# Patient Record
Sex: Male | Born: 1980 | Race: White | Hispanic: Yes | Marital: Single | Smoking: Current some day smoker
Health system: Southern US, Community
[De-identification: ages and names within clinical notes are randomized; demographics above are authoritative.]

## PROBLEM LIST (undated history)

## (undated) DIAGNOSIS — F10931 Alcohol use, unspecified with withdrawal delirium: Secondary | ICD-10-CM

## (undated) DIAGNOSIS — F1010 Alcohol abuse, uncomplicated: Secondary | ICD-10-CM

## (undated) DIAGNOSIS — I10 Essential (primary) hypertension: Secondary | ICD-10-CM

## (undated) DIAGNOSIS — F10231 Alcohol dependence with withdrawal delirium: Secondary | ICD-10-CM

## (undated) DIAGNOSIS — K7460 Unspecified cirrhosis of liver: Secondary | ICD-10-CM

---

## 2003-09-05 ENCOUNTER — Emergency Department (HOSPITAL_COMMUNITY): Admission: EM | Admit: 2003-09-05 | Discharge: 2003-09-05 | Payer: Self-pay | Admitting: Emergency Medicine

## 2009-08-08 ENCOUNTER — Emergency Department (HOSPITAL_COMMUNITY): Admission: EM | Admit: 2009-08-08 | Discharge: 2009-08-08 | Payer: Self-pay | Admitting: Emergency Medicine

## 2019-11-10 ENCOUNTER — Other Ambulatory Visit: Payer: Self-pay

## 2019-11-10 ENCOUNTER — Encounter (HOSPITAL_COMMUNITY): Payer: Self-pay | Admitting: Emergency Medicine

## 2019-11-10 ENCOUNTER — Emergency Department (HOSPITAL_COMMUNITY)
Admission: EM | Admit: 2019-11-10 | Discharge: 2019-11-11 | Disposition: A | Discharge status: Home / Self Care | Payer: Self-pay | Attending: Emergency Medicine | Admitting: Emergency Medicine

## 2019-11-10 DIAGNOSIS — R451 Restlessness and agitation: Secondary | ICD-10-CM | POA: Insufficient documentation

## 2019-11-10 DIAGNOSIS — Z20822 Contact with and (suspected) exposure to covid-19: Secondary | ICD-10-CM | POA: Insufficient documentation

## 2019-11-10 DIAGNOSIS — R443 Hallucinations, unspecified: Secondary | ICD-10-CM | POA: Insufficient documentation

## 2019-11-10 DIAGNOSIS — E871 Hypo-osmolality and hyponatremia: Secondary | ICD-10-CM | POA: Insufficient documentation

## 2019-11-10 NOTE — ED Triage Notes (Signed)
Wife brought patient into ED to be evaluated for hallucinations.  Patient has done this before after using a drug.  Unknown if he has done this again.  Patient denies any SI or HI at this time.

## 2019-11-11 ENCOUNTER — Emergency Department (HOSPITAL_COMMUNITY): Payer: Self-pay

## 2019-11-11 LAB — COMPREHENSIVE METABOLIC PANEL
ALT: 78 U/L — ABNORMAL HIGH (ref 0–44)
AST: 101 U/L — ABNORMAL HIGH (ref 15–41)
Albumin: 4.5 g/dL (ref 3.5–5.0)
Alkaline Phosphatase: 94 U/L (ref 38–126)
Anion gap: 13 (ref 5–15)
BUN: 11 mg/dL (ref 6–20)
CO2: 22 mmol/L (ref 22–32)
Calcium: 9.6 mg/dL (ref 8.9–10.3)
Chloride: 95 mmol/L — ABNORMAL LOW (ref 98–111)
Creatinine, Ser: 0.64 mg/dL (ref 0.61–1.24)
GFR calc Af Amer: >60 mL/min (ref >60)
GFR calc non Af Amer: >60 mL/min (ref >60)
Glucose, Bld: 150 mg/dL — ABNORMAL HIGH (ref 70–99)
Potassium: 3 mmol/L — ABNORMAL LOW (ref 3.5–5.1)
Sodium: 130 mmol/L — ABNORMAL LOW (ref 135–145)
Total Bilirubin: 1.9 mg/dL — ABNORMAL HIGH (ref 0.3–1.2)
Total Protein: 8.6 g/dL — ABNORMAL HIGH (ref 6.5–8.1)

## 2019-11-11 LAB — CBC
HCT: 43.2 % (ref 39.0–52.0)
Hemoglobin: 14.4 g/dL (ref 13.0–17.0)
MCH: 30.9 pg (ref 26.0–34.0)
MCHC: 33.3 g/dL (ref 30.0–36.0)
MCV: 92.7 fL (ref 80.0–100.0)
Platelets: 91 10*3/uL — ABNORMAL LOW (ref 150–400)
RBC: 4.66 MIL/uL (ref 4.22–5.81)
RDW: 15.4 % (ref 11.5–15.5)
WBC: 8.3 10*3/uL (ref 4.0–10.5)
nRBC: 0 % (ref 0.0–0.2)

## 2019-11-11 LAB — SARS CORONAVIRUS 2 BY RT PCR (HOSPITAL ORDER, PERFORMED IN ~~LOC~~ HOSPITAL LAB): SARS Coronavirus 2: NEGATIVE

## 2019-11-11 LAB — RAPID URINE DRUG SCREEN, HOSP PERFORMED
Amphetamines: POSITIVE — AB
Barbiturates: NOT DETECTED
Benzodiazepines: NOT DETECTED
Cocaine: NOT DETECTED
Opiates: NOT DETECTED
Tetrahydrocannabinol: NOT DETECTED

## 2019-11-11 LAB — ETHANOL: Alcohol, Ethyl (B): 74 mg/dL — ABNORMAL HIGH (ref <10)

## 2019-11-11 LAB — SALICYLATE LEVEL: Salicylate Lvl: <7 mg/dL — ABNORMAL LOW (ref 7.0–30.0)

## 2019-11-11 LAB — ACETAMINOPHEN LEVEL: Acetaminophen (Tylenol), Serum: <10 ug/mL — ABNORMAL LOW (ref 10–30)

## 2019-11-11 MED ORDER — THIAMINE HCL 100 MG PO TABS
100.0000 mg | ORAL_TABLET | Freq: Every day | ORAL | Status: DC
  Administered 2019-11-11: 100 mg via ORAL
  Filled 2019-11-11: qty 1

## 2019-11-11 MED ORDER — LORAZEPAM 1 MG PO TABS
1.0000 mg | ORAL_TABLET | Freq: Once | ORAL | Status: AC
  Administered 2019-11-11: 1 mg via ORAL
  Filled 2019-11-11: qty 1

## 2019-11-11 MED ORDER — HYDROXYZINE HCL 25 MG PO TABS
25.0000 mg | ORAL_TABLET | Freq: Three times a day (TID) | ORAL | 0 refills | Status: DC | PRN
Start: 2019-11-11 — End: 2020-10-04

## 2019-11-11 MED ORDER — THIAMINE HCL 100 MG/ML IJ SOLN: 100.0000 mg | Freq: Every day | INTRAMUSCULAR | Status: DC

## 2019-11-11 MED ORDER — POTASSIUM CHLORIDE CRYS ER 20 MEQ PO TBCR
40.0000 meq | EXTENDED_RELEASE_TABLET | Freq: Once | ORAL | Status: AC
  Administered 2019-11-11: 40 meq via ORAL
  Filled 2019-11-11: qty 2

## 2019-11-11 MED ORDER — LORAZEPAM 2 MG/ML IJ SOLN: 0.0000 mg | Freq: Two times a day (BID) | INTRAMUSCULAR | Status: DC

## 2019-11-11 MED ORDER — LORAZEPAM 2 MG/ML IJ SOLN: 0.0000 mg | Freq: Four times a day (QID) | INTRAMUSCULAR | Status: DC

## 2019-11-11 MED ORDER — LORAZEPAM 1 MG PO TABS: 0.0000 mg | ORAL_TABLET | Freq: Two times a day (BID) | ORAL | Status: DC

## 2019-11-11 MED ORDER — LORAZEPAM 1 MG PO TABS: 0.0000 mg | ORAL_TABLET | Freq: Four times a day (QID) | ORAL | Status: DC

## 2019-11-11 NOTE — Discharge Instructions (Addendum)
I suspect her hallucinations are likely due to drinking alcohol and drug use.  Take Vistaril as directed.   Your work-up today was reassuring.  You did have some slight elevations in your liver enzymes.  This could be due to alcohol use.  This needs to be rechecked in a few weeks.  Tylenol.  I have provided you some resources to help if you feel like you need help with drugs.    Return to the emergency department for any hallucinations, thoughts of wanting to hurt or kill yourself or any other worsening or concerning symptoms.

## 2019-11-11 NOTE — BH Assessment (Addendum)
Tele Assessment Note   Patient Name: [name] MRN: [id number] Referring Physician: Elson Clan Location of Patient: [facility] ED Location of Provider: Behavioral Health TTS Department  Draven Natter Ardyth Harps is an 39 y.o. male presenting voluntarily to [facility] ED for assessment. Patient reports 3 days ago he began to see people staring at him and hearing them talk. He states his family did not see and hear these things. He states he became paranoid someone was after him. Patient reports AVH earlier this date but none currently. He denies SI/HI. Patient reports he used a small amount of cocaine on Sunday with his friends. UDS negative for cocaine but positive for amphetamines. Patient states his friends use "all kinds of drugs." Counselor discussed effects and dangers of methamphetamines. He states he does not currently have any services and states he does not feel he is in need of assistance with his substance use and mental health at this time.   Patient is alert and oriented x 4. He is dressed appropriately. His speech is logical, eye contact is poor, and his thoughts are organized. His mood is depressed and his affect is congruent. He has limited insight, judgement, and impulse control. He does not appear to be responding to internal stimuli or experiencing delusional thought content.   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 No pertinent past medical history.  History reviewed. No pertinent surgical history.  Family History: No family history on file.  Social History:  has no history on file for tobacco use, alcohol use, and drug use.  Additional Social History:  Alcohol / Drug Use Pain Medications: see MAR Prescriptions: see MAR Over the Counter: see MAR History of alcohol / drug use?: Yes Substance #1 Name of Substance 1: cocaine 1 - Age of First Use: UTA 1 - Amount (size/oz): UTA 1 - Frequency:  occsionally 1 - Duration: UTA 1 - Last Use / Amount: UTA  CIWA: CIWA-Ar BP: (!) 168/96 Pulse Rate: 82 Nausea and Vomiting: no nausea and no vomiting Tactile Disturbances: none Tremor: moderate, with patient's arms extended Auditory Disturbances: very mild harshness or ability to frighten Paroxysmal Sweats: no sweat visible Visual Disturbances: not present Anxiety: two Headache, Fullness in Head: none present Agitation: normal activity Orientation and Clouding of Sensorium: oriented and can do serial additions CIWA-Ar Total: 7 COWS:    Allergies:  Allergies  Allergen Reactions  . Penicillins     Home Medications: (Not in a hospital admission)   OB/GYN Status:  No LMP for male patient.  General Assessment Data Location of Assessment: [facility] ED TTS Assessment: In system Is this a Tele or Face-to-Face Assessment?: Tele Assessment Is this an Initial Assessment or a Re-assessment for this encounter?: Initial Assessment Patient Accompanied by:: N/A Language Other than English: No Living Arrangements:  (with family) What gender do you identify as?: Male Date Telepsych consult ordered in [facility]: 11/11/19 Time Telepsych consult ordered in [facility]: 1512 Marital status: Married Pregnancy Status: No Living Arrangements: Spouse/significant other, Children Can pt return to current living arrangement?: Yes Admission Status: Voluntary Is patient capable of signing voluntary admission?: Yes Referral Source: Self/Family/Friend Insurance type: none     Crisis Care Plan Living Arrangements: Spouse/significant other, Children Legal Guardian:  (self) Name of Psychiatrist: denies Name of Therapist: denies  Education Status Is patient currently in school?: No Is the patient employed, unemployed or receiving disability?: Employed  Risk to self with the past 6 months Suicidal Ideation: No Has  patient been a risk to self within the past 6 months prior to admission? : No Suicidal Intent:  No Has patient had any suicidal intent within the past 6 months prior to admission? : No Is patient at risk for suicide?: No Suicidal Plan?: No Has patient had any suicidal plan within the past 6 months prior to admission? : No Access to Means: No What has been your use of drugs/alcohol within the last 12 months?: reports using cocaine and alcohol Previous Attempts/Gestures: No How many times?: 0 Other Self Harm Risks: denies Triggers for Past Attempts: None known Intentional Self Injurious Behavior: None Family Suicide History: No Recent stressful life event(s):  (none noted) Persecutory voices/beliefs?: No Depression: No Substance abuse history and/or treatment for substance abuse?: No Suicide prevention information given to non-admitted patients: Not applicable  Risk to Others within the past 6 months Homicidal Ideation: No Does patient have any lifetime risk of violence toward others beyond the six months prior to admission? : No Thoughts of Harm to Others: No Current Homicidal Intent: No Current Homicidal Plan: No Access to Homicidal Means: No Identified Victim: denies History of harm to others?: No Assessment of Violence: None Noted Violent Behavior Description: denies Does patient have access to weapons?: No Criminal Charges Pending?: No Does patient have a court date: No Is patient on probation?: No  Psychosis Hallucinations: Auditory, Visual Delusions: Unspecified  Mental Status Report Appearance/Hygiene: Unremarkable Eye Contact: Poor Motor Activity: Freedom of movement Speech: Logical/coherent Level of Consciousness: Alert Mood: Anxious Affect: Anxious Anxiety Level: Moderate Thought Processes: Coherent, Relevant Judgement: Partial Orientation: Person, Place, Time, Situation Obsessive Compulsive Thoughts/Behaviors: None  Cognitive Functioning Concentration: Normal Memory: Recent Intact, Remote Intact Is patient IDD: No Insight: Fair Impulse  Control: Fair Appetite: Good Have you had any weight changes? : No Change Sleep: No Change Total Hours of Sleep: 7 Vegetative Symptoms: None  ADLScreening [facility] Assessment Services) Patient's cognitive ability adequate to safely complete daily activities?: Yes Patient able to express need for assistance with ADLs?: Yes Independently performs ADLs?: Yes (appropriate for developmental age)  Prior Inpatient Therapy Prior Inpatient Therapy: No  Prior Outpatient Therapy Prior Outpatient Therapy: No Does patient have an ACCT team?: No Does patient have Intensive In-House Services?  : No Does patient have [facility] services? : No Does patient have P4CC services?: No  ADL Screening (condition at time of admission) Patient's cognitive ability adequate to safely complete daily activities?: Yes Is the patient deaf or have difficulty hearing?: No Does the patient have difficulty seeing, even when wearing glasses/contacts?: No Does the patient have difficulty concentrating, remembering, or making decisions?: No Patient able to express need for assistance with ADLs?: Yes Does the patient have difficulty dressing or bathing?: No Independently performs ADLs?: Yes (appropriate for developmental age) Does the patient have difficulty walking or climbing stairs?: No Weakness of Legs: None Weakness of Arms/Hands: None  Home Assistive Devices/Equipment Home Assistive Devices/Equipment: None  Therapy Consults (therapy consults require a physician order) PT Evaluation Needed: No OT Evalulation Needed: No SLP Evaluation Needed: No Abuse/Neglect Assessment (Assessment to be complete while patient is alone) Abuse/Neglect Assessment Can Be Completed: Yes Physical Abuse: Denies Verbal Abuse: Denies Sexual Abuse: Denies Exploitation of patient/patient's resources: Denies Self-Neglect: Denies Values / Beliefs Cultural Requests During Hospitalization: None Spiritual Requests During Hospitalization:  None Consults Spiritual Care Consult Needed: No Transition of Care Team Consult Needed: No Advance Directives (For Healthcare) Does Patient Have a Medical Advance Directive?: No Would patient like information on creating  a medical advance directive?: No - Patient declined          Disposition: Per Berneice Heinrich, FNP this patient does not meet in patient criteria and is psych cleared.TTS will fax over substance use resources. Disposition Initial Assessment Completed for this Encounter: Yes  This service was provided via telemedicine using a 2-way, interactive audio and video technology.  Names of all persons participating in this telemedicine service and their role in this encounter. Name: Kahleb Mcclane Role: patient  Name: Celedonio Miyamoto, LCSW Role: TTS  Name:  Role:   Name:  Role:     Celedonio Miyamoto 11/11/2019 5:52 PM

## 2019-11-11 NOTE — BHH Counselor (Addendum)
Disposition: Per Berneice Heinrich, FNP this patient does not meet in patient criteria and is psych cleared.TTS will fax over substance use resources.

## 2019-11-11 NOTE — ED Provider Notes (Signed)
[facility] EMERGENCY DEPARTMENT Provider Note   CSN: [id number] Arrival date & time: 11/10/19  2044     History Chief Complaint  Patient presents with  . Hallucinations    [name] is a 39 y.o. male brought in by wife for evaluation of hallucinations.  Per wife, patient had been having hallucinations 3 days ago.  He was at home on Sunday and wife states that he was concerned that people were trying to get him.  She reports that he was running out to the street running away from people.  She reports that he knocked on a bunch of neighbors doors.  He does endorse using cocaine and drinking beer on Sunday prior to this event.  Patient states he currently does not see anything go away feels like he is still been having hallucinations.  He reports that still while in the hospital patient has been saying "look" and there will be nobody there.  Patient reports that sometimes he does see things.  He reports that on Sunday, he got nervous because he thought people were following him and he saw people.  He states that since using drugs on Sunday, this has not happened to him again.  He does not daily drinker.  He reports that his last drink was Sunday.  He states that he had did not use any other drugs.  He denies any SI, HI.  He denies any complaints at this time.  The history is provided by the patient.       History reviewed. No pertinent past medical history.  There are no problems to display for this patient.   History reviewed. No pertinent surgical history.     No family history on file.  Social History   Tobacco Use  . Smoking status: Not on file  Substance Use Topics  . Alcohol use: Not on file  . Drug use: Not on file    Home Medications Prior to Admission medications   Medication Sig Start Date End Date Taking? Authorizing Provider  hydrOXYzine (ATARAX/VISTARIL) 25 MG tablet Take 1 tablet (25 mg total) by mouth every 8 (eight) hours as  needed. 11/11/19   Maxwell Caul, PA-C    Allergies    Penicillins  Review of Systems   Review of Systems  Constitutional: Negative for fever.  Respiratory: Negative for cough and shortness of breath.   Cardiovascular: Negative for chest pain.  Gastrointestinal: Negative for abdominal pain, nausea and vomiting.  Neurological: Negative for weakness, numbness and headaches.  Psychiatric/Behavioral: Positive for hallucinations. Negative for suicidal ideas.  All other systems reviewed and are negative.   Physical Exam Updated Vital Signs BP (!) 151/100   Pulse 94   Temp 98.4 F (36.9 C) (Oral)   Resp 16   SpO2 100%   Physical Exam Vitals and nursing note reviewed.  Constitutional:      Appearance: Normal appearance. He is well-developed.  HENT:     Head: Normocephalic and atraumatic.  Eyes:     General: Lids are normal.     Conjunctiva/sclera: Conjunctivae normal.     Pupils: Pupils are equal, round, and reactive to light.  Cardiovascular:     Rate and Rhythm: Normal rate and regular rhythm.     Pulses: Normal pulses.     Heart sounds: Normal heart sounds. No murmur heard.  No friction rub. No gallop.   Pulmonary:     Effort: Pulmonary effort is normal.     Breath sounds:  Normal breath sounds.     Comments: Lungs clear to auscultation bilaterally.  Symmetric chest rise.  No wheezing, rales, rhonchi. Abdominal:     Palpations: Abdomen is soft. Abdomen is not rigid.     Tenderness: There is no abdominal tenderness. There is no guarding.  Musculoskeletal:        General: Normal range of motion.     Cervical back: Full passive range of motion without pain.  Skin:    General: Skin is warm and dry.     Capillary Refill: Capillary refill takes less than 2 seconds.  Neurological:     Mental Status: He is alert and oriented to person, place, and time.     Comments: Cranial nerves III-XII intact Follows commands, Moves all extremities  5/5 strength to BUE and BLE    Sensation intact throughout all major nerve distributions Normal gait No slurred speech. No facial droop.   Psychiatric:        Speech: Speech normal.     ED Results / Procedures / Treatments   Labs (all labs ordered are listed, but only abnormal results are displayed) Labs Reviewed  COMPREHENSIVE METABOLIC PANEL - Abnormal; Notable for the following components:      Result Value   Sodium 130 (*)    Potassium 3.0 (*)    Chloride 95 (*)    Glucose, Bld 150 (*)    Total Protein 8.6 (*)    AST 101 (*)    ALT 78 (*)    Total Bilirubin 1.9 (*)    All other components within normal limits  ETHANOL - Abnormal; Notable for the following components:   Alcohol, Ethyl (B) 74 (*)    All other components within normal limits  SALICYLATE LEVEL - Abnormal; Notable for the following components:   Salicylate Lvl <7.0 (*)    All other components within normal limits  ACETAMINOPHEN LEVEL - Abnormal; Notable for the following components:   Acetaminophen (Tylenol), Serum <10 (*)    All other components within normal limits  CBC - Abnormal; Notable for the following components:   Platelets 91 (*)    All other components within normal limits  RAPID URINE DRUG SCREEN, HOSP PERFORMED - Abnormal; Notable for the following components:   Amphetamines POSITIVE (*)    All other components within normal limits  SARS CORONAVIRUS 2 BY RT PCR (HOSPITAL ORDER, PERFORMED IN [facility])    EKG None  Radiology CT Head Wo Contrast  Result Date: 11/11/2019 CLINICAL DATA:  Psychosis. Additional history provided: Hallucinations. EXAM: CT HEAD WITHOUT CONTRAST TECHNIQUE: Contiguous axial images were obtained from the base of the skull through the vertex without intravenous contrast. COMPARISON:  No pertinent prior exams are available for comparison. FINDINGS: Brain: Cerebral volume is normal. There is no acute intracranial hemorrhage. No demarcated cortical infarct. No extra-axial fluid  collection. No evidence of intracranial mass. No midline shift. Vascular: No hyperdense vessel. Skull: Normal. Negative for fracture or focal lesion. Sinuses/Orbits: Visualized orbits show no acute finding. Mild ethmoid sinus mucosal thickening. No significant mastoid effusion. Other: Anterior subluxation of the right mandibular condyle. IMPRESSION: Unremarkable non-contrast CT appearance of the brain. No evidence of acute intracranial abnormality. Anterior subluxation of the right mandibular condyle. Correlate with physical exam to exclude TMJ dislocation. Mild ethmoid sinus mucosal thickening. Electronically Signed   By: Jackey Loge DO   On: 11/11/2019 16:57    Procedures Procedures (including critical care time)  Medications Ordered in ED Medications  LORazepam (ATIVAN) injection 0-4 mg (0 mg Intravenous Not Given 11/11/19 1604)    Or  LORazepam (ATIVAN) tablet 0-4 mg ( Oral See Alternative 11/11/19 1604)  LORazepam (ATIVAN) injection 0-4 mg (has no administration in time range)    Or  LORazepam (ATIVAN) tablet 0-4 mg (has no administration in time range)  thiamine tablet 100 mg (100 mg Oral Given 11/11/19 1532)    Or  thiamine (B-1) injection 100 mg ( Intravenous See Alternative 11/11/19 1532)  LORazepam (ATIVAN) tablet 1 mg (1 mg Oral Given 11/11/19 1531)  potassium chloride SA (KLOR-CON) CR tablet 40 mEq (40 mEq Oral Given 11/11/19 1532)  LORazepam (ATIVAN) tablet 1 mg (1 mg Oral Given 11/11/19 1823)    ED Course  I have reviewed the triage vital signs and the nursing notes.  Pertinent labs & imaging results that were available during my care of the patient were reviewed by me and considered in my medical decision making (see chart for details).    MDM Rules/Calculators/A&P                           39 year old male presents for evaluation of hallucinations.  Wife reports that 2 days ago, patient was paranoid and stated that people were after him.  She reports that he ran into the  street and was knocking on people's doors.  Patient states that this was after he drank beer and used cocaine.  He states he has not had any more hallucinations but wife feels like he has had some episodes where he has been paranoid.  He states that his last drink was 2 days ago.  On initial ED arrival, he is afebrile, nontoxic-appearing.  Vital signs are stable.  He denies any SI, HI.  He denies any current hallucinations, visual or auditory.  We will plan to check labs and CT scan for evaluation of medical pathology.  I suspect this is most likely from drug use.  Wife is concerned because she states he has had some hallucinations outside of drug use.  Patient actively denying any hallucinations on my evaluation.  We will plan for TTS consultation.  At this time, patient does not appear to be in acute withdrawal.  He is slightly agitated but he is not tachycardic here in the ED.  He is not responding to internal stimuli.  Will give Ativan for anxious nervousness.  Covid negative.  Ethanol level 74.  CBC shows no leukocytosis or anemia.  Platelets are 91.  Acetaminophen level, salicylate level unremarkable.  CMP shows slight hyponatremia.  Potassium is 3.0.  Will give repletion.  BUN and creatinine within normal limits.  AST is 101, ALT of 78.  CT head negative for any acute abnormality.  I discussed work-up here in the ED with patient.  I did discuss with patient regarding LFT elevation.  I suspect this is most likely from alcohol.  Patient's wife is asking something for nervousness.  Will give short course of Vistaril.  Per [facility], patient is psych cleared.  He does not meet inpatient criteria.  At this time, patient denies any SI, HI.  Able to converse without any signs of confusion.  Denies any active hallucinations at this time.  He is hemodynamically stable.  Patient instructed to repeat his LFTs.  He was given a referral for outpatient PCP.  Additionally, patient given resources for substance abuse. At  this time, patient exhibits no emergent life-threatening condition that require further  evaluation in ED or admission. Patient had ample opportunity for questions and discussion. All patient's questions were answered with full understanding. Strict return precautions discussed. Patient expresses understanding and agreement to plan.  Portions of this note were generated with Scientist, clinical (histocompatibility and immunogenetics)Dragon dictation software. Dictation errors may occur despite best attempts at proofreading.  Final Clinical Impression(s) / ED Diagnoses Final diagnoses:  Hallucinations    Rx / DC Orders ED Discharge Orders         Ordered    hydrOXYzine (ATARAX/VISTARIL) 25 MG tablet  Every 8 hours PRN     Discontinue  Reprint     11/11/19 1833           Maxwell CaulLayden, Maneh Sieben A, PA-C 11/11/19 1839    Little, Ambrose Finlandachel Morgan, MD 11/14/19 [phone]

## 2019-11-11 NOTE — ED Notes (Signed)
Patient verbalizes understanding of discharge instructions. Opportunity for questioning and answers were provided. Armband removed by staff, pt discharged from ED ambulatory.   

## 2019-11-12 ENCOUNTER — Emergency Department (HOSPITAL_COMMUNITY): Payer: Self-pay

## 2019-11-12 ENCOUNTER — Encounter (HOSPITAL_COMMUNITY): Payer: Self-pay

## 2019-11-12 ENCOUNTER — Inpatient Hospital Stay (HOSPITAL_COMMUNITY)
Admission: EM | Admit: 2019-11-12 | Discharge: 2019-11-19 | DRG: 896 | Disposition: A | Discharge status: Home / Self Care | Payer: Self-pay | Attending: Internal Medicine | Admitting: Internal Medicine

## 2019-11-12 DIAGNOSIS — Y904 Blood alcohol level of 80-99 mg/100 ml: Secondary | ICD-10-CM | POA: Diagnosis present

## 2019-11-12 DIAGNOSIS — Z781 Physical restraint status: Secondary | ICD-10-CM

## 2019-11-12 DIAGNOSIS — F1910 Other psychoactive substance abuse, uncomplicated: Secondary | ICD-10-CM

## 2019-11-12 DIAGNOSIS — Z20822 Contact with and (suspected) exposure to covid-19: Secondary | ICD-10-CM | POA: Diagnosis present

## 2019-11-12 DIAGNOSIS — M6282 Rhabdomyolysis: Secondary | ICD-10-CM | POA: Diagnosis present

## 2019-11-12 DIAGNOSIS — E872 Acidosis: Secondary | ICD-10-CM | POA: Diagnosis present

## 2019-11-12 DIAGNOSIS — F329 Major depressive disorder, single episode, unspecified: Secondary | ICD-10-CM | POA: Diagnosis present

## 2019-11-12 DIAGNOSIS — F17200 Nicotine dependence, unspecified, uncomplicated: Secondary | ICD-10-CM | POA: Diagnosis present

## 2019-11-12 DIAGNOSIS — K7010 Alcoholic hepatitis without ascites: Secondary | ICD-10-CM | POA: Diagnosis present

## 2019-11-12 DIAGNOSIS — F10931 Alcohol use, unspecified with withdrawal delirium: Secondary | ICD-10-CM | POA: Diagnosis present

## 2019-11-12 DIAGNOSIS — G9341 Metabolic encephalopathy: Secondary | ICD-10-CM | POA: Diagnosis present

## 2019-11-12 DIAGNOSIS — F1010 Alcohol abuse, uncomplicated: Secondary | ICD-10-CM

## 2019-11-12 DIAGNOSIS — Z880 Allergy status to penicillin: Secondary | ICD-10-CM

## 2019-11-12 DIAGNOSIS — R748 Abnormal levels of other serum enzymes: Secondary | ICD-10-CM | POA: Diagnosis present

## 2019-11-12 DIAGNOSIS — F10229 Alcohol dependence with intoxication, unspecified: Secondary | ICD-10-CM | POA: Diagnosis present

## 2019-11-12 DIAGNOSIS — K7030 Alcoholic cirrhosis of liver without ascites: Secondary | ICD-10-CM | POA: Diagnosis present

## 2019-11-12 DIAGNOSIS — F10231 Alcohol dependence with withdrawal delirium: Principal | ICD-10-CM | POA: Diagnosis present

## 2019-11-12 DIAGNOSIS — D696 Thrombocytopenia, unspecified: Secondary | ICD-10-CM | POA: Diagnosis present

## 2019-11-12 DIAGNOSIS — F15959 Other stimulant use, unspecified with stimulant-induced psychotic disorder, unspecified: Secondary | ICD-10-CM | POA: Diagnosis present

## 2019-11-12 DIAGNOSIS — E876 Hypokalemia: Secondary | ICD-10-CM | POA: Diagnosis present

## 2019-11-12 DIAGNOSIS — R000 Tachycardia, unspecified: Secondary | ICD-10-CM | POA: Diagnosis present

## 2019-11-12 DIAGNOSIS — I10 Essential (primary) hypertension: Secondary | ICD-10-CM | POA: Diagnosis present

## 2019-11-12 DIAGNOSIS — F1490 Cocaine use, unspecified, uncomplicated: Secondary | ICD-10-CM | POA: Diagnosis present

## 2019-11-12 LAB — CBC WITH DIFFERENTIAL/PLATELET
Abs Immature Granulocytes: 0.01 10*3/uL (ref 0.00–0.07)
Basophils Absolute: 0.1 10*3/uL (ref 0.0–0.1)
Basophils Relative: 1 %
Eosinophils Absolute: 0.3 10*3/uL (ref 0.0–0.5)
Eosinophils Relative: 3 %
HCT: 42.6 % (ref 39.0–52.0)
Hemoglobin: 14.3 g/dL (ref 13.0–17.0)
Immature Granulocytes: 0 %
Lymphocytes Relative: 22 %
Lymphs Abs: 1.8 10*3/uL (ref 0.7–4.0)
MCH: 31 pg (ref 26.0–34.0)
MCHC: 33.6 g/dL (ref 30.0–36.0)
MCV: 92.4 fL (ref 80.0–100.0)
Monocytes Absolute: 1.3 10*3/uL — ABNORMAL HIGH (ref 0.1–1.0)
Monocytes Relative: 16 %
Neutro Abs: 4.8 10*3/uL (ref 1.7–7.7)
Neutrophils Relative %: 58 %
Platelets: 114 10*3/uL — ABNORMAL LOW (ref 150–400)
RBC: 4.61 MIL/uL (ref 4.22–5.81)
RDW: 15 % (ref 11.5–15.5)
WBC: 8.3 10*3/uL (ref 4.0–10.5)
nRBC: 0 % (ref 0.0–0.2)

## 2019-11-12 LAB — URINALYSIS, ROUTINE W REFLEX MICROSCOPIC
Bilirubin Urine: NEGATIVE
Glucose, UA: NEGATIVE mg/dL
Hgb urine dipstick: NEGATIVE
Ketones, ur: NEGATIVE mg/dL
Leukocytes,Ua: NEGATIVE
Nitrite: NEGATIVE
Protein, ur: NEGATIVE mg/dL
Specific Gravity, Urine: 1.002 — ABNORMAL LOW (ref 1.005–1.030)
pH: 6 (ref 5.0–8.0)

## 2019-11-12 LAB — RAPID URINE DRUG SCREEN, HOSP PERFORMED
Amphetamines: NOT DETECTED
Barbiturates: NOT DETECTED
Benzodiazepines: NOT DETECTED
Cocaine: NOT DETECTED
Opiates: NOT DETECTED
Tetrahydrocannabinol: NOT DETECTED

## 2019-11-12 LAB — COMPREHENSIVE METABOLIC PANEL
ALT: 120 U/L — ABNORMAL HIGH (ref 0–44)
AST: 152 U/L — ABNORMAL HIGH (ref 15–41)
Albumin: 4.6 g/dL (ref 3.5–5.0)
Alkaline Phosphatase: 90 U/L (ref 38–126)
Anion gap: 9 (ref 5–15)
BUN: 9 mg/dL (ref 6–20)
CO2: 26 mmol/L (ref 22–32)
Calcium: 9.1 mg/dL (ref 8.9–10.3)
Chloride: 98 mmol/L (ref 98–111)
Creatinine, Ser: 0.56 mg/dL — ABNORMAL LOW (ref 0.61–1.24)
GFR calc Af Amer: >60 mL/min (ref >60)
GFR calc non Af Amer: >60 mL/min (ref >60)
Glucose, Bld: 102 mg/dL — ABNORMAL HIGH (ref 70–99)
Potassium: 3.8 mmol/L (ref 3.5–5.1)
Sodium: 133 mmol/L — ABNORMAL LOW (ref 135–145)
Total Bilirubin: 2.3 mg/dL — ABNORMAL HIGH (ref 0.3–1.2)
Total Protein: 8.6 g/dL — ABNORMAL HIGH (ref 6.5–8.1)

## 2019-11-12 LAB — ETHANOL: Alcohol, Ethyl (B): 98 mg/dL — ABNORMAL HIGH (ref <10)

## 2019-11-12 LAB — SALICYLATE LEVEL: Salicylate Lvl: <7 mg/dL — ABNORMAL LOW (ref 7.0–30.0)

## 2019-11-12 LAB — SARS CORONAVIRUS 2 BY RT PCR (HOSPITAL ORDER, PERFORMED IN ~~LOC~~ HOSPITAL LAB): SARS Coronavirus 2: NEGATIVE

## 2019-11-12 LAB — ACETAMINOPHEN LEVEL: Acetaminophen (Tylenol), Serum: <10 ug/mL — ABNORMAL LOW (ref 10–30)

## 2019-11-12 MED ORDER — SODIUM CHLORIDE 0.9 % IV BOLUS
500.0000 mL | Freq: Once | INTRAVENOUS | Status: AC
  Administered 2019-11-12: 500 mL via INTRAVENOUS

## 2019-11-12 MED ORDER — IBUPROFEN 200 MG PO TABS
600.0000 mg | ORAL_TABLET | Freq: Three times a day (TID) | ORAL | Status: DC | PRN
  Administered 2019-11-17: 600 mg via ORAL
  Filled 2019-11-12: qty 3

## 2019-11-12 MED ORDER — HALOPERIDOL LACTATE 5 MG/ML IJ SOLN
5.0000 mg | Freq: Once | INTRAMUSCULAR | Status: AC
  Administered 2019-11-12: 5 mg via INTRAMUSCULAR

## 2019-11-12 MED ORDER — HALOPERIDOL LACTATE 5 MG/ML IJ SOLN
2.0000 mg | Freq: Once | INTRAMUSCULAR | Status: DC
  Filled 2019-11-12: qty 1

## 2019-11-12 MED ORDER — THIAMINE HCL 100 MG PO TABS
100.0000 mg | ORAL_TABLET | Freq: Every day | ORAL | Status: DC
  Administered 2019-11-12: 100 mg via ORAL
  Filled 2019-11-12: qty 1

## 2019-11-12 MED ORDER — LORAZEPAM 2 MG/ML IJ SOLN
0.0000 mg | Freq: Four times a day (QID) | INTRAMUSCULAR | Status: DC
  Administered 2019-11-13: 2 mg via INTRAVENOUS
  Filled 2019-11-12: qty 1

## 2019-11-12 MED ORDER — ZIPRASIDONE MESYLATE 20 MG IM SOLR
20.0000 mg | Freq: Once | INTRAMUSCULAR | Status: AC
Start: 2019-11-12 — End: 2019-11-12
  Administered 2019-11-12: 20 mg via INTRAMUSCULAR
  Filled 2019-11-12: qty 20

## 2019-11-12 MED ORDER — THIAMINE HCL 100 MG/ML IJ SOLN: 100.0000 mg | Freq: Every day | INTRAMUSCULAR | Status: DC

## 2019-11-12 MED ORDER — LORAZEPAM 2 MG/ML IJ SOLN: 0.0000 mg | Freq: Two times a day (BID) | INTRAMUSCULAR | Status: DC

## 2019-11-12 MED ORDER — LORAZEPAM 1 MG PO TABS: 0.0000 mg | ORAL_TABLET | Freq: Two times a day (BID) | ORAL | Status: DC

## 2019-11-12 MED ORDER — LORAZEPAM 2 MG/ML IJ SOLN: 2.0000 mg | Freq: Once | INTRAMUSCULAR | Status: DC

## 2019-11-12 MED ORDER — LORAZEPAM 1 MG PO TABS
0.0000 mg | ORAL_TABLET | Freq: Four times a day (QID) | ORAL | Status: DC
  Administered 2019-11-12: 2 mg via ORAL
  Filled 2019-11-12: qty 2

## 2019-11-12 MED ORDER — LORAZEPAM 2 MG/ML IJ SOLN
1.0000 mg | Freq: Once | INTRAMUSCULAR | Status: AC
  Administered 2019-11-12: 1 mg via INTRAVENOUS
  Filled 2019-11-12: qty 1

## 2019-11-12 MED ORDER — HYDROXYZINE HCL 25 MG PO TABS: 25.0000 mg | ORAL_TABLET | Freq: Three times a day (TID) | ORAL | Status: DC | PRN

## 2019-11-12 MED ORDER — STERILE WATER FOR INJECTION IJ SOLN
INTRAMUSCULAR | Status: AC
  Administered 2019-11-12: 10 mL
  Filled 2019-11-12: qty 10
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
## 2019-11-12 NOTE — ED Provider Notes (Signed)
Patient to ED under IVC by family for erratic, paranoid behavior, visual and auditory hallucinations, substance abuse. He was reportedly "knocking on neighbor's doors asking for their weapons". History of "amphetamine induced psychotic disorder".   He required chemical and physical restraint in the ED. VSS. Labs show liver dsyfunction, NEG drug screen here.   Plan: close observation, TTS consult when able.   12:15 - Discussed the patient with Dr. Fredderick Phenix who was present when patient arrived. Acute alcohol withdrawal with delirium was suspected based on his initial presentation. Plan for observation changed to inpatient disposition after conversation with the hospitalist.    Elpidio Anis, PA-C 11/13/19 [id number]    Rolan Bucco, MD 11/13/19 1650

## 2019-11-12 NOTE — ED Provider Notes (Signed)
[facility]-EMERGENCY DEPT Provider Note   CSN: [id number] Arrival date & time: 11/12/19  1116     History Chief Complaint  Patient presents with  . IVC  . Psychiatric Evaluation    Adeeb Konecny is a 39 y.o. male.  HPI HPI will be deferred due to level 5 caveat due to psychiatric disorder   Patient presents the emergency department IVC in police custody.  IVC paperwork was taken out by patient's daughter and wife.  IVC papers states he is a threat to himself and others has been seen walking around talking to people who are not there, running around the neighborhood ringing doorbell's and asking for their weapons.  It says patient took cocaine 5 days ago and drinks every day.  Patient was last seen here on on the 08/10 for similar complaints hallucination.  Lab work and imaging did not reveal any significant findings.  TTS consult was performed and states he did not meet criteria for inpatient admission and was given the diagnosis of amphetamine induced psychotic disorder.  He was alert and oriented and safe for discharge.  He presents back here today with continued hallucinations, he appears to be responding inappropriately to internal stimuli, actively trying to pull out his teeth, when asked questions states he needs to catch the sheep that are running around.  He denies suicidal or homicidal ideations.  States he is in no distress at this time.  He has no medical history, does not take any medications on a daily basis.  History reviewed. No pertinent past medical history.  There are no problems to display for this patient.   History reviewed. No pertinent surgical history.     History reviewed. No pertinent family history.  Social History   Tobacco Use  . Smoking status: Not on file  Substance Use Topics  . Alcohol use: Yes    Comment: drinks 6 beers daily  . Drug use: Yes    Types: Cocaine    Comment: states that he used cocaine on one  occasion last night    Home Medications Prior to Admission medications   Medication Sig Start Date End Date Taking? Authorizing Provider  hydrOXYzine (ATARAX/VISTARIL) 25 MG tablet Take 1 tablet (25 mg total) by mouth every 8 (eight) hours as needed. 11/11/19   Maxwell Caul, PA-C    Allergies    Penicillins  Review of Systems   Review of Systems  Unable to perform ROS: Psychiatric disorder    Physical Exam Updated Vital Signs BP (!) 151/103 (BP Location: Left Arm)   Pulse 97   Temp 98.8 F (37.1 C) (Oral)   Resp 18   Ht 5\' 6"  (1.676 m)   Wt 81.6 kg   SpO2 98%   BMI 29.05 kg/m   Physical Exam Vitals and nursing note reviewed.  Constitutional:      General: He is not in acute distress.    Appearance: He is not ill-appearing.  HENT:     Head: Normocephalic and atraumatic.     Nose: No congestion.     Mouth/Throat:     Mouth: Mucous membranes are moist.     Pharynx: Oropharynx is clear. No oropharyngeal exudate or posterior oropharyngeal erythema.  Eyes:     General: No scleral icterus.       Right eye: No discharge.        Left eye: No discharge.     Pupils: Pupils are equal, round, and reactive to light.  Cardiovascular:     Rate and Rhythm: Normal rate and regular rhythm.     Pulses: Normal pulses.     Heart sounds: No murmur heard.  No friction rub. No gallop.   Pulmonary:     Effort: No respiratory distress.     Breath sounds: No wheezing, rhonchi or rales.  Abdominal:     General: There is no distension.     Palpations: Abdomen is soft.     Tenderness: There is no abdominal tenderness. There is no right CVA tenderness, left CVA tenderness or guarding.  Musculoskeletal:        General: No swelling or tenderness.     Cervical back: No rigidity or tenderness.     Right lower leg: No edema.     Left lower leg: No edema.  Skin:    General: Skin is warm and dry.     Capillary Refill: Capillary refill takes less than 2 seconds.     Coloration: Skin is  not jaundiced or pale.     Findings: No bruising, lesion or rash.  Neurological:     General: No focal deficit present.     Mental Status: He is alert. He is disoriented.  Psychiatric:        Mood and Affect: Mood normal.     ED Results / Procedures / Treatments   Labs (all labs ordered are listed, but only abnormal results are displayed) Labs Reviewed  COMPREHENSIVE METABOLIC PANEL - Abnormal; Notable for the following components:      Result Value   Sodium 133 (*)    Glucose, Bld 102 (*)    Creatinine, Ser 0.56 (*)    Total Protein 8.6 (*)    AST 152 (*)    ALT 120 (*)    Total Bilirubin 2.3 (*)    All other components within normal limits  ETHANOL - Abnormal; Notable for the following components:   Alcohol, Ethyl (B) 98 (*)    All other components within normal limits  CBC WITH DIFFERENTIAL/PLATELET - Abnormal; Notable for the following components:   Platelets 114 (*)    Monocytes Absolute 1.3 (*)    All other components within normal limits  URINALYSIS, ROUTINE W REFLEX MICROSCOPIC - Abnormal; Notable for the following components:   Color, Urine STRAW (*)    Specific Gravity, Urine 1.002 (*)    All other components within normal limits  SALICYLATE LEVEL - Abnormal; Notable for the following components:   Salicylate Lvl <7.0 (*)    All other components within normal limits  ACETAMINOPHEN LEVEL - Abnormal; Notable for the following components:   Acetaminophen (Tylenol), Serum <10 (*)    All other components within normal limits  SARS CORONAVIRUS 2 BY RT PCR (HOSPITAL ORDER, PERFORMED IN [facility])  RAPID URINE DRUG SCREEN, HOSP PERFORMED    EKG None  Radiology CT Head Wo Contrast  Result Date: 11/11/2019 CLINICAL DATA:  Psychosis. Additional history provided: Hallucinations. EXAM: CT HEAD WITHOUT CONTRAST TECHNIQUE: Contiguous axial images were obtained from the base of the skull through the vertex without intravenous contrast. COMPARISON:  No  pertinent prior exams are available for comparison. FINDINGS: Brain: Cerebral volume is normal. There is no acute intracranial hemorrhage. No demarcated cortical infarct. No extra-axial fluid collection. No evidence of intracranial mass. No midline shift. Vascular: No hyperdense vessel. Skull: Normal. Negative for fracture or focal lesion. Sinuses/Orbits: Visualized orbits show no acute finding. Mild ethmoid sinus mucosal thickening. No significant mastoid effusion. Other:  Anterior subluxation of the right mandibular condyle. IMPRESSION: Unremarkable non-contrast CT appearance of the brain. No evidence of acute intracranial abnormality. Anterior subluxation of the right mandibular condyle. Correlate with physical exam to exclude TMJ dislocation. Mild ethmoid sinus mucosal thickening. Electronically Signed   By: Jackey LogeKyle  Golden DO   On: 11/11/2019 16:57   US Abdomen Limited  Result Date: 11/12/2019 CLINICAL DATA:  39 year old male with elevated LFTs. EXAM: ULTRASOUND ABDOMEN LIMITED RIGHT UPPER QUADRANT COMPARISON:  None. FINDINGS: Gallbladder: No gallstones or wall thickening visualized. No sonographic Murphy sign noted by sonographer. Common bile duct: Diameter: 3 mm Liver: There is morphologic changes of cirrhosis. Portal vein is patent on color Doppler imaging with normal direction of blood flow towards the liver. Other: None. IMPRESSION: 1. Cirrhosis. 2. 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 3. No gallstone. Electronically Signed   By: Elgie CollardArash  Radparvar M.D.   On: 11/12/2019 15:37    Procedures Procedures (including critical care time)  Medications Ordered in ED Medications  LORazepam (ATIVAN) injection 0-4 mg (has no administration in time range)    Or  LORazepam (ATIVAN) tablet 0-4 mg (has no administration in time range)  LORazepam (ATIVAN) injection 0-4 mg (has no administration in time range)    Or  LORazepam (ATIVAN) tablet 0-4 mg (has no administration in time range)  thiamine  tablet 100 mg (has no administration in time range)    Or  thiamine (B-1) injection 100 mg (has no administration in time range)  ibuprofen (ADVIL) tablet 600 mg (has no administration in time range)  hydrOXYzine (ATARAX/VISTARIL) tablet 25 mg (has no administration in time range)    ED Course  I have reviewed the triage vital signs and the nursing notes.  Pertinent labs & imaging results that were available during my care of the patient were reviewed by me and considered in my medical decision making (see chart for details).    MDM Rules/Calculators/A&P                          I have personally reviewed all imaging, labs and have interpreted them.  Patient did not appear to be in acute distress, did not appear to be disheveled, unable to provide appropriate responses, appears to be reacting inappropriately to internal stimuli.  Physical exam did not show any acute abnormalities.  Will perform med clearance labs and consult TTS for further evaluation.  IVC will remain in effect due to danger to self and others.  Initial labs are showing elevated liver enzymes AST 152 and ALT 120 with an elevated T bili of 2.3.  No right upper quadrant pain, no jaundice seen on exam possible secondary to alcohol consumption will order limited ultrasound of abdomen to rule out gallbladder abnormality.  Ultrasound showed cirrhosis, patient main portal vien with hepatopetal flow, no gallstones noted.   Low suspicion for systemic infection as patient is nontoxic-appearing, vital signs reassuring, CBC does not show leukocytosis no obvious source of infection seen on exam.  UA was negative for nitrates or leukocytes making Pil[place] or UTI unlikely.  Unlikely patient suffering from a metabolic abnormality as CMP no electrolyte abnormality, no signs of AKI.  Urine toxin was performed and was negative.  Acetaminophen was less than 10, salicylate less than 7, ethanol 98.  Unlikely patient suffering from cardiac  abnormality as he denies chest pain, no signs of hypoperfusion seen on exam.  TTS evaluate the patient and recommends inpatient care due to  alcoholic use disorder.  Will place in ED psych hold with CIWA.  Home meds were ordered.  Patient will remain IVC at this time. Final Clinical Impression(s) / ED Diagnoses Final diagnoses:  Alcohol abuse  Polysubstance abuse [facility])    Rx / DC Orders ED Discharge Orders    None       Barnie Del 11/12/19 1728    Mancel Bale, MD 11/12/19 1909

## 2019-11-12 NOTE — ED Provider Notes (Signed)
RN notified me that patient was diaphoretic and becoming more agitated.  He was found walking extremely diaphoretic.  Suspicious for alcoholic withdrawal, gave him 3 of Ativan and started IV fluids on him.  Nurse notified me again that he was trying to remove his IVs and was continually walking around becoming more combative.  Soft change or use to keep the patient from removing his IV and keep him in a stretcher.  She was still extremely agitated gave him 20 of Geodon.  Nurse notified me again that patient was continuing being combative trying to fight with his restraints gave him 5 of Haldol and gave him 2 mg of Ativan.  Will consult hospitalist to have patient admitted for alcohol withdrawals.  Spoke with Dr. Corinna Gab of the hospitalist team she will evaluate the patient for possible admission to hospital.  Due to shift change patient will be handed off to shari upstill PAC she was provided HPI, current work-up, and likely disposition.    Carroll Sage, PA-C 11/12/19 2316    Rolan Bucco, MD 11/13/19 1650
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
## 2019-11-12 NOTE — ED Notes (Signed)
Patient restless, moving around the hall attempting to move the bed. Patient redirectable. Saturated in sweat and visibly shaking. EDP made aware.

## 2019-11-13 DIAGNOSIS — F10231 Alcohol dependence with withdrawal delirium: Principal | ICD-10-CM

## 2019-11-13 DIAGNOSIS — F10931 Alcohol use, unspecified with withdrawal delirium: Secondary | ICD-10-CM | POA: Diagnosis present

## 2019-11-13 LAB — TROPONIN I (HIGH SENSITIVITY)
Troponin I (High Sensitivity): 8 ng/L (ref <18)
Troponin I (High Sensitivity): 10 ng/L (ref <18)

## 2019-11-13 LAB — CK
Total CK: 1365 U/L — ABNORMAL HIGH (ref 49–397)
Total CK: 976 U/L — ABNORMAL HIGH (ref 49–397)

## 2019-11-13 LAB — COMPREHENSIVE METABOLIC PANEL
ALT: 141 U/L — ABNORMAL HIGH (ref 0–44)
AST: 181 U/L — ABNORMAL HIGH (ref 15–41)
Albumin: 3.9 g/dL (ref 3.5–5.0)
Alkaline Phosphatase: 76 U/L (ref 38–126)
Anion gap: 9 (ref 5–15)
BUN: 13 mg/dL (ref 6–20)
CO2: 24 mmol/L (ref 22–32)
Calcium: 8.3 mg/dL — ABNORMAL LOW (ref 8.9–10.3)
Chloride: 105 mmol/L (ref 98–111)
Creatinine, Ser: 0.5 mg/dL — ABNORMAL LOW (ref 0.61–1.24)
GFR calc Af Amer: >60 mL/min (ref >60)
GFR calc non Af Amer: >60 mL/min (ref >60)
Glucose, Bld: 127 mg/dL — ABNORMAL HIGH (ref 70–99)
Potassium: 3.4 mmol/L — ABNORMAL LOW (ref 3.5–5.1)
Sodium: 138 mmol/L (ref 135–145)
Total Bilirubin: 1.7 mg/dL — ABNORMAL HIGH (ref 0.3–1.2)
Total Protein: 7.6 g/dL (ref 6.5–8.1)

## 2019-11-13 LAB — CBC
HCT: 39.7 % (ref 39.0–52.0)
Hemoglobin: 12.9 g/dL — ABNORMAL LOW (ref 13.0–17.0)
MCH: 31.2 pg (ref 26.0–34.0)
MCHC: 32.5 g/dL (ref 30.0–36.0)
MCV: 95.9 fL (ref 80.0–100.0)
Platelets: 85 10*3/uL — ABNORMAL LOW (ref 150–400)
RBC: 4.14 MIL/uL — ABNORMAL LOW (ref 4.22–5.81)
RDW: 15.1 % (ref 11.5–15.5)
WBC: 6.1 10*3/uL (ref 4.0–10.5)
nRBC: 0 % (ref 0.0–0.2)

## 2019-11-13 LAB — HEPATITIS PANEL, ACUTE
HCV Ab: NONREACTIVE
Hep A IgM: NONREACTIVE
Hep B C IgM: NONREACTIVE
Hepatitis B Surface Ag: NONREACTIVE

## 2019-11-13 LAB — AMMONIA
Ammonia: 57 umol/L — ABNORMAL HIGH (ref 9–35)
Ammonia: 71 umol/L — ABNORMAL HIGH (ref 9–35)

## 2019-11-13 LAB — HIV ANTIBODY (ROUTINE TESTING W REFLEX): HIV Screen 4th Generation wRfx: NONREACTIVE

## 2019-11-13 LAB — PHOSPHORUS: Phosphorus: 4.4 mg/dL (ref 2.5–4.6)

## 2019-11-13 LAB — MAGNESIUM: Magnesium: 2.3 mg/dL (ref 1.7–2.4)

## 2019-11-13 LAB — TSH: TSH: 1.943 u[IU]/mL (ref 0.350–4.500)

## 2019-11-13 MED ORDER — FOLIC ACID 1 MG PO TABS: 1.0000 mg | ORAL_TABLET | Freq: Every day | ORAL | Status: DC

## 2019-11-13 MED ORDER — LACTULOSE ENEMA
300.0000 mL | Freq: Once | ORAL | Status: DC
  Filled 2019-11-13: qty 300

## 2019-11-13 MED ORDER — ONDANSETRON HCL 4 MG/2ML IJ SOLN: 4.0000 mg | Freq: Four times a day (QID) | INTRAMUSCULAR | Status: DC | PRN

## 2019-11-13 MED ORDER — HEPARIN SODIUM (PORCINE) 5000 UNIT/ML IJ SOLN
5000.0000 [IU] | Freq: Three times a day (TID) | INTRAMUSCULAR | Status: DC
  Administered 2019-11-13: 5000 [IU] via SUBCUTANEOUS
  Filled 2019-11-13: qty 1

## 2019-11-13 MED ORDER — MAGNESIUM SULFATE 2 GM/50ML IV SOLN
2.0000 g | Freq: Once | INTRAVENOUS | Status: AC
  Administered 2019-11-13: 2 g via INTRAVENOUS
  Filled 2019-11-13: qty 50

## 2019-11-13 MED ORDER — THIAMINE HCL 100 MG PO TABS
100.0000 mg | ORAL_TABLET | Freq: Every day | ORAL | Status: DC
  Administered 2019-11-15 – 2019-11-19 (×5): 100 mg via ORAL
  Filled 2019-11-13 (×5): qty 1

## 2019-11-13 MED ORDER — THIAMINE HCL 100 MG/ML IJ SOLN
100.0000 mg | Freq: Every day | INTRAMUSCULAR | Status: DC
  Administered 2019-11-13 – 2019-11-14 (×2): 100 mg via INTRAVENOUS
  Filled 2019-11-13 (×2): qty 2

## 2019-11-13 MED ORDER — LORAZEPAM 2 MG/ML IJ SOLN
1.0000 mg | Freq: Three times a day (TID) | INTRAMUSCULAR | Status: AC
  Administered 2019-11-15 (×2): 1 mg via INTRAVENOUS
  Filled 2019-11-13 (×2): qty 1

## 2019-11-13 MED ORDER — SODIUM CHLORIDE 0.9 % IV SOLN: 1.0000 mg | Freq: Every day | INTRAVENOUS | Status: DC

## 2019-11-13 MED ORDER — POTASSIUM CHLORIDE 10 MEQ/100ML IV SOLN
10.0000 meq | INTRAVENOUS | Status: AC
  Administered 2019-11-13 (×2): 10 meq via INTRAVENOUS
  Filled 2019-11-13 (×2): qty 100

## 2019-11-13 MED ORDER — FOLIC ACID 5 MG/ML IJ SOLN
1.0000 mg | Freq: Every day | INTRAMUSCULAR | Status: DC
  Administered 2019-11-13 – 2019-11-16 (×4): 1 mg via INTRAVENOUS
  Filled 2019-11-13 (×4): qty 0.2

## 2019-11-13 MED ORDER — LORAZEPAM 2 MG/ML IJ SOLN
1.0000 mg | Freq: Four times a day (QID) | INTRAMUSCULAR | Status: AC
  Administered 2019-11-14 – 2019-11-15 (×2): 1 mg via INTRAVENOUS
  Filled 2019-11-13 (×2): qty 1

## 2019-11-13 MED ORDER — DEXTROSE IN LACTATED RINGERS 5 % IV SOLN
INTRAVENOUS | Status: DC
  Filled 2019-11-13 (×2): qty 1000

## 2019-11-13 MED ORDER — LORAZEPAM 1 MG PO TABS: 1.0000 mg | ORAL_TABLET | ORAL | Status: DC | PRN

## 2019-11-13 MED ORDER — LORAZEPAM 2 MG/ML IJ SOLN
1.0000 mg | INTRAMUSCULAR | Status: DC | PRN
  Administered 2019-11-16 (×2): 1 mg via INTRAVENOUS
  Filled 2019-11-13 (×2): qty 1

## 2019-11-13 MED ORDER — POLYETHYLENE GLYCOL 3350 17 G PO PACK: 17.0000 g | PACK | Freq: Every day | ORAL | Status: DC | PRN

## 2019-11-13 MED ORDER — LORAZEPAM 2 MG/ML IJ SOLN
1.0000 mg | INTRAMUSCULAR | Status: AC
  Administered 2019-11-13 (×2): 1 mg via INTRAVENOUS
  Filled 2019-11-13 (×3): qty 1

## 2019-11-13 MED ORDER — LORAZEPAM 2 MG/ML IJ SOLN: 0.0000 mg | Freq: Three times a day (TID) | INTRAMUSCULAR | Status: DC

## 2019-11-13 MED ORDER — SODIUM CHLORIDE 0.9 % IV SOLN: INTRAVENOUS | Status: DC

## 2019-11-13 MED ORDER — DEXMEDETOMIDINE HCL IN NACL 200 MCG/50ML IV SOLN
0.2000 ug/kg/h | INTRAVENOUS | Status: DC
  Administered 2019-11-13: 0.2 ug/kg/h via INTRAVENOUS
  Administered 2019-11-13: 0.5 ug/kg/h via INTRAVENOUS
  Administered 2019-11-14: 0.3 ug/kg/h via INTRAVENOUS
  Administered 2019-11-14 (×2): 0.7 ug/kg/h via INTRAVENOUS
  Administered 2019-11-14: 0.4 ug/kg/h via INTRAVENOUS
  Administered 2019-11-15 (×2): 0.3 ug/kg/h via INTRAVENOUS
  Filled 2019-11-13 (×12): qty 50

## 2019-11-13 MED ORDER — OXYCODONE HCL 5 MG PO TABS: 5.0000 mg | ORAL_TABLET | ORAL | Status: DC | PRN

## 2019-11-13 MED ORDER — LORAZEPAM 2 MG/ML IJ SOLN
1.0000 mg | INTRAMUSCULAR | Status: DC | PRN
  Administered 2019-11-13: 2 mg via INTRAVENOUS
  Administered 2019-11-13: 4 mg via INTRAVENOUS
  Filled 2019-11-13: qty 2

## 2019-11-13 MED ORDER — ONDANSETRON HCL 4 MG PO TABS: 4.0000 mg | ORAL_TABLET | Freq: Four times a day (QID) | ORAL | Status: DC | PRN

## 2019-11-13 MED ORDER — POTASSIUM CHLORIDE 10 MEQ/100ML IV SOLN
10.0000 meq | INTRAVENOUS | Status: AC
  Administered 2019-11-13 (×4): 10 meq via INTRAVENOUS
  Filled 2019-11-13 (×4): qty 100

## 2019-11-13 MED ORDER — ADULT MULTIVITAMIN W/MINERALS CH
1.0000 | ORAL_TABLET | Freq: Every day | ORAL | Status: DC
  Administered 2019-11-15 – 2019-11-19 (×5): 1 via ORAL
  Filled 2019-11-13 (×5): qty 1

## 2019-11-13 MED ORDER — ZIPRASIDONE MESYLATE 20 MG IM SOLR
10.0000 mg | Freq: Once | INTRAMUSCULAR | Status: AC
  Administered 2019-11-13: 10 mg via INTRAMUSCULAR
  Filled 2019-11-13: qty 20

## 2019-11-13 MED ORDER — LORAZEPAM 2 MG/ML IJ SOLN
0.0000 mg | INTRAMUSCULAR | Status: DC
  Administered 2019-11-13: 1 mg via INTRAVENOUS
  Administered 2019-11-13: 2 mg via INTRAVENOUS
  Filled 2019-11-13: qty 1
  Filled 2019-11-13: qty 2
  Filled 2019-11-13: qty 1

## 2019-11-13 NOTE — ED Notes (Signed)
Came in to get report from night shift RN, patient is yelling, incomprehensible speech, pressured, sweating profusely and fighting against 4-point violent restraints. CIAWA and Ativan administered, no change in behavior. Pt is hypertensive and tachycardic. Rips off heart monitor leads.

## 2019-11-13 NOTE — ED Notes (Signed)
Tube for lactulose enema being sent from ICU

## 2019-11-13 NOTE — H&P (Signed)
TRH H&P    Patient Demographics:    [name], is a 39 y.o. male  MRN: [id number]  DOB - 01/10/1981  Admit Date - 11/12/2019  Referring MD/NP/PA: Valda Favia  Outpatient Primary MD for the patient is Patient, No Pcp Per  Patient coming from: Home under IVC  Chief complaint- Hallucinations   HPI:    [name]  is a 39 y.o. male, with no known medical history presents to ED under IVC.  Patient is not able to provide history as he is sedated.  Reported history is that he was having hallucinations since Tuesday after having cocaine on Sunday.  He was seen in the ER on Tuesday and cleared to go home.  He is continued to have some hallucinations and has been responding to internal stimuli so he was brought back to the ER.  Psych evaluated and recommended inpatient detox.  CIWA protocol was ordered in the ER.  Patient required multiple extra doses of Ativan for total of 5 mg of Ativan, 10 of Geodon, 5 of Haldol to relieve his agitation.  It is possible that patient will require Precedex, admitting to stepdown for acute alcohol withdrawal with delirium.  In the ED temperature nine 9.4, blood pressure 142/104, heart rate 96, respiratory rate 18, saturating at 99% CBC unremarkable Basic CHEM unremarkable LFTs elevated with an AST of 152, ALT 120, T bili of 2.3 Covid negative CT head shows no acute intracranial abnormality Ultrasound abdomen shows cirrhosis with patent main portal vein with hepatopetal flow and no gallstone UDS is negative Alcohol level is 98 Salicylate level is less than 7 Admission requested for further management of alcohol withdrawal with delirium    Review of systems:    Could not be obtained secondary to patient's altered mental status.    Past History of the following :    History reviewed. No pertinent past medical history.    History reviewed. No pertinent  surgical history.    Social History:      Social History   Tobacco Use  . Smoking status: Not on file  Substance Use Topics  . Alcohol use: Yes    Comment: drinks 6 beers daily       Family History :    History reviewed. No pertinent family history. Not able to review family history as patient is sedated   Home Medications:   Prior to Admission medications   Medication Sig Start Date End Date Taking? Authorizing Provider  hydrOXYzine (ATARAX/VISTARIL) 25 MG tablet Take 1 tablet (25 mg total) by mouth every 8 (eight) hours as needed. 11/11/19   Maxwell Caul, PA-C     Allergies:     Allergies  Allergen Reactions  . Penicillins Other (See Comments)    Unknown reaction     Physical Exam:   Vitals  Blood pressure (!) 178/86, pulse 96, temperature 99.4 F (37.4 C), temperature source Oral, resp. rate (!) 22, height  (1.676 m), weight 81.6 kg, SpO2 98 %.  1.  General: Lying supine in bed in  four-point restraints  2. Psychiatric: Sedated due to agitated and behavior that could result in self-harm  3. Neurologic: Patient protecting airway Pupils reactive Patient cannot further participate in neuro exam due to sedation  4. HEENMT:  Head is atraumatic, normocephalic, poor dentition, mucous membranes are moist, trachea is midline, neck is supple  5. Respiratory : Lungs are clear to auscultation bilaterally  6. Cardiovascular : Heart rate is normal, rhythm is regular, no murmurs  7. Gastrointestinal:  Abdomen is soft, nondistended, no palpable masses  8. Skin:  No acute lesions on limited skin exam  9.Musculoskeletal:  No peripheral edema or acute deformity    Data Review:    CBC Recent Labs  Lab 11/10/19 2329 11/12/19 1300  WBC 8.3 8.3  HGB 14.4 14.3  HCT 43.2 42.6  PLT 91* 114*  MCV 92.7 92.4  MCH 30.9 31.0  MCHC 33.3 33.6  RDW 15.4 15.0  LYMPHSABS  --  1.8  MONOABS  --  1.3*  EOSABS  --  0.3  BASOSABS  --  0.1    ------------------------------------------------------------------------------------------------------------------  Results for orders placed or performed during the hospital encounter of 11/12/19 (from the past 48 hour(s))  Urine rapid drug screen (hosp performed)     Status: None   Collection Time: 11/12/19 12:27 PM  Result Value Ref Range   Opiates NONE DETECTED NONE DETECTED   Cocaine NONE DETECTED NONE DETECTED   Benzodiazepines NONE DETECTED NONE DETECTED   Amphetamines NONE DETECTED NONE DETECTED   Tetrahydrocannabinol NONE DETECTED NONE DETECTED   Barbiturates NONE DETECTED NONE DETECTED    Comment: (NOTE) DRUG SCREEN FOR MEDICAL PURPOSES ONLY.  IF CONFIRMATION IS NEEDED FOR ANY PURPOSE, NOTIFY LAB WITHIN 5 DAYS.  LOWEST DETECTABLE LIMITS FOR URINE DRUG SCREEN Drug Class                     Cutoff (ng/mL) Amphetamine and metabolites    1000 Barbiturate and metabolites    200 Benzodiazepine                 200 Tricyclics and metabolites     300 Opiates and metabolites        300 Cocaine and metabolites        300 THC                            50 Performed at [facility], [address], [place], [place] [address]   Urinalysis, Routine w reflex microscopic Urine, Clean Catch     Status: Abnormal   Collection Time: 11/12/19 12:27 PM  Result Value Ref Range   Color, Urine STRAW (A) YELLOW   APPearance CLEAR CLEAR   Specific Gravity, Urine 1.002 (L) 1.005 - 1.030   pH 6.0 5.0 - 8.0   Glucose, UA NEGATIVE NEGATIVE mg/dL   Hgb urine dipstick NEGATIVE NEGATIVE   Bilirubin Urine NEGATIVE NEGATIVE   Ketones, ur NEGATIVE NEGATIVE mg/dL   Protein, ur NEGATIVE NEGATIVE mg/dL   Nitrite NEGATIVE NEGATIVE   Leukocytes,Ua NEGATIVE NEGATIVE    Comment: Performed at [facility], [address] [place], [place] [address]  Comprehensive metabolic panel     Status: Abnormal   Collection Time: 11/12/19  1:00 PM  Result Value Ref Range    Sodium 133 (L) 135 - 145 mmol/L   Potassium 3.8 3.5 - 5.1 mmol/L   Chloride 98 98 - 111 mmol/L   CO2  26 22 - 32 mmol/L   Glucose, Bld 102 (H) 70 - 99 mg/dL    Comment: Glucose reference range applies only to samples taken after fasting for at least 8 hours.   BUN 9 6 - 20 mg/dL   Creatinine, Ser 0.93 (L) 0.61 - 1.24 mg/dL   Calcium 9.1 8.9 - 26.7 mg/dL   Total Protein 8.6 (H) 6.5 - 8.1 g/dL   Albumin 4.6 3.5 - 5.0 g/dL   AST 124 (H) 15 - 41 U/L   ALT 120 (H) 0 - 44 U/L   Alkaline Phosphatase 90 38 - 126 U/L   Total Bilirubin 2.3 (H) 0.3 - 1.2 mg/dL   GFR calc non Af Amer >60 >60 mL/min   GFR calc Af Amer >60 >60 mL/min   Anion gap 9 5 - 15    Comment: Performed at [facility], [address] [place], [place] [address]  Ethanol     Status: Abnormal   Collection Time: 11/12/19  1:00 PM  Result Value Ref Range   Alcohol, Ethyl (B) 98 (H) <10 mg/dL    Comment: (NOTE) Lowest detectable limit for serum alcohol is 10 mg/dL.  For medical purposes only. Performed at [facility], [address], [place], [place] [address]   CBC with Diff     Status: Abnormal   Collection Time: 11/12/19  1:00 PM  Result Value Ref Range   WBC 8.3 4.0 - 10.5 K/uL   RBC 4.61 4.22 - 5.81 MIL/uL   Hemoglobin 14.3 13.0 - 17.0 g/dL   HCT 50.5 39 - 52 %   MCV 92.4 80.0 - 100.0 fL   MCH 31.0 26.0 - 34.0 pg   MCHC 33.6 30.0 - 36.0 g/dL   RDW 39.7 67.3 - 41.9 %   Platelets 114 (L) 150 - 400 K/uL    Comment: REPEATED TO VERIFY PLATELET COUNT CONFIRMED BY SMEAR SPECIMEN CHECKED FOR CLOTS Immature Platelet Fraction may be clinically indicated, consider ordering this additional test [id number]    nRBC 0.0 0.0 - 0.2 %   Neutrophils Relative % 58 %   Neutro Abs 4.8 1.7 - 7.7 K/uL   Lymphocytes Relative 22 %   Lymphs Abs 1.8 0.7 - 4.0 K/uL   Monocytes Relative 16 %   Monocytes Absolute 1.3 (H) 0 - 1 K/uL   Eosinophils Relative 3 %   Eosinophils Absolute 0.3 0 - 0  K/uL   Basophils Relative 1 %   Basophils Absolute 0.1 0 - 0 K/uL   Immature Granulocytes 0 %   Abs Immature Granulocytes 0.01 0.00 - 0.07 K/uL    Comment: Performed at [facility], [address], [place], [place] [address]  Salicylate level     Status: Abnormal   Collection Time: 11/12/19  1:00 PM  Result Value Ref Range   Salicylate Lvl <7.0 (L) 7.0 - 30.0 mg/dL    Comment: Performed at [facility], [address] [place], [place] [address]  Acetaminophen level     Status: Abnormal   Collection Time: 11/12/19  1:00 PM  Result Value Ref Range   Acetaminophen (Tylenol), Serum <10 (L) 10 - 30 ug/mL    Comment: (NOTE) Therapeutic concentrations vary significantly. A range of 10-30 ug/mL  may be an effective concentration for many patients. However, some  are best treated at concentrations outside of this range. Acetaminophen concentrations >150 ug/mL at 4 hours after ingestion  and >50 ug/mL at 12 hours  after ingestion are often associated with  toxic reactions.  Performed at [facility], [address] [place], [place] [address]   SARS Coronavirus 2 by RT PCR (hospital order, performed in [facility] hospital lab) Nasopharyngeal Nasopharyngeal Swab     Status: None   Collection Time: 11/12/19  4:45 PM   Specimen: Nasopharyngeal Swab  Result Value Ref Range   SARS Coronavirus 2 NEGATIVE NEGATIVE    Comment: (NOTE) SARS-CoV-2 target nucleic acids are NOT DETECTED.  The SARS-CoV-2 RNA is generally detectable in upper and lower respiratory specimens during the acute phase of infection. The lowest concentration of SARS-CoV-2 viral copies this assay can detect is 250 copies / mL. A negative result does not preclude SARS-CoV-2 infection and should not be used as the sole basis for treatment or other patient management decisions.  A negative result may occur with improper specimen collection / handling, submission of  specimen other than nasopharyngeal swab, presence of viral mutation(s) within the areas targeted by this assay, and inadequate number of viral copies (<250 copies / mL). A negative result must be combined with clinical observations, patient history, and epidemiological information.  Fact Sheet for Patients:   BoilerBrush.com.cyhttps://www.fda.gov/media/136312/download  Fact Sheet for Healthcare Providers: https://pope.com/https://www.fda.gov/media/136313/download  This test is not yet approved or  cleared by the [place] FDA and has been authorized for detection and/or diagnosis of SARS-CoV-2 by FDA under an Emergency Use Authorization (EUA).  This EUA will remain in effect (meaning this test can be used) for the duration of the COVID-19 declaration under Section 564(b)(1) of the Act, 21 U.S.C. section 360bbb-3(b)(1), unless the authorization is terminated or revoked sooner.  Performed at [facility], [address] [place], [place] [address]   Ammonia     Status: Abnormal   Collection Time: 11/12/19 11:39 PM  Result Value Ref Range   Ammonia 71 (H) 9 - 35 umol/L    Comment: Performed at [facility], [address] [place], [place] [address]  CK     Status: Abnormal   Collection Time: 11/12/19 11:39 PM  Result Value Ref Range   Total CK 1,365 (H) 49.0 - 397.0 U/L    Comment: Performed at [facility], [address] [place], [place] [address]    Chemistries  Recent Labs  Lab 11/10/19 2329 11/12/19 1300  NA 130* 133*  K 3.0* 3.8  CL 95* 98  CO2 22 26  GLUCOSE 150* 102*  BUN 11 9  CREATININE 0.64 0.56*  CALCIUM 9.6 9.1  AST 101* 152*  ALT 78* 120*  ALKPHOS 94 90  BILITOT 1.9* 2.3*   ------------------------------------------------------------------------------------------------------------------  ------------------------------------------------------------------------------------------------------------------ GFR: Estimated  Creatinine Clearance: 125.6 mL/min (A) (by C-G formula based on SCr of 0.56 mg/dL (L)). Liver Function Tests: Recent Labs  Lab 11/10/19 2329 11/12/19 1300  AST 101* 152*  ALT 78* 120*  ALKPHOS 94 90  BILITOT 1.9* 2.3*  PROT 8.6* 8.6*  ALBUMIN 4.5 4.6   No results for input(s): LIPASE, AMYLASE in the last 168 hours. Recent Labs  Lab 11/12/19 2339  AMMONIA 71*   Coagulation Profile: No results for input(s): INR, PROTIME in the last 168 hours. Cardiac Enzymes: Recent Labs  Lab 11/12/19 2339  CKTOTAL 1,365*   BNP (last 3 results) No results for input(s): PROBNP in the last 8760 hours. HbA1C: No results for input(s): HGBA1C in the last 72 hours. CBG: No results for input(s): GLUCAP in the last 168 hours. Lipid Profile:  No results for input(s): CHOL, HDL, LDLCALC, TRIG, CHOLHDL, LDLDIRECT in the last 72 hours. Thyroid Function Tests: No results for input(s): TSH, T4TOTAL, FREET4, T3FREE, THYROIDAB in the last 72 hours. Anemia Panel: No results for input(s): VITAMINB12, FOLATE, FERRITIN, TIBC, IRON, RETICCTPCT in the last 72 hours.  --------------------------------------------------------------------------------------------------------------- Urine analysis:    Component Value Date/Time   COLORURINE STRAW (A) 11/12/2019 1227   APPEARANCEUR CLEAR 11/12/2019 1227   LABSPEC 1.002 (L) 11/12/2019 1227   PHURINE 6.0 11/12/2019 1227   GLUCOSEU NEGATIVE 11/12/2019 1227   HGBUR NEGATIVE 11/12/2019 1227   BILIRUBINUR NEGATIVE 11/12/2019 1227   KETONESUR NEGATIVE 11/12/2019 1227   PROTEINUR NEGATIVE 11/12/2019 1227   NITRITE NEGATIVE 11/12/2019 1227   LEUKOCYTESUR NEGATIVE 11/12/2019 1227      Imaging Results:    CT Head Wo Contrast  Result Date: 11/11/2019 CLINICAL DATA:  Psychosis. Additional history provided: Hallucinations. EXAM: CT HEAD WITHOUT CONTRAST TECHNIQUE: Contiguous axial images were obtained from the base of the skull through the vertex without intravenous  contrast. COMPARISON:  No pertinent prior exams are available for comparison. FINDINGS: Brain: Cerebral volume is normal. There is no acute intracranial hemorrhage. No demarcated cortical infarct. No extra-axial fluid collection. No evidence of intracranial mass. No midline shift. Vascular: No hyperdense vessel. Skull: Normal. Negative for fracture or focal lesion. Sinuses/Orbits: Visualized orbits show no acute finding. Mild ethmoid sinus mucosal thickening. No significant mastoid effusion. Other: Anterior subluxation of the right mandibular condyle. IMPRESSION: Unremarkable non-contrast CT appearance of the brain. No evidence of acute intracranial abnormality. Anterior subluxation of the right mandibular condyle. Correlate with physical exam to exclude TMJ dislocation. Mild ethmoid sinus mucosal thickening. Electronically Signed   By: Jackey Loge DO   On: 11/11/2019 16:57   US Abdomen Limited  Result Date: 11/12/2019 CLINICAL DATA:  39 year old male with elevated LFTs. EXAM: ULTRASOUND ABDOMEN LIMITED RIGHT UPPER QUADRANT COMPARISON:  None. FINDINGS: Gallbladder: No gallstones or wall thickening visualized. No sonographic Murphy sign noted by sonographer. Common bile duct: Diameter: 3 mm Liver: There is morphologic changes of cirrhosis. Portal vein is patent on color Doppler imaging with normal direction of blood flow towards the liver. Other: None. IMPRESSION: 1. Cirrhosis. 2. 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 3. No gallstone. Electronically Signed   By: Elgie Collard M.D.   On: 11/12/2019 15:37    My personal review of EKG: Rhythm NSR, Rate 96 /min, QTc 495 ,no Acute ST changes   Assessment & Plan:    Active Problems:   Alcohol withdrawal delirium (HCC)   1. Acute metabolic encephalopathy 1. Secondary to withdrawal from alcohol 2. CT head shows no acute intracranial abnormality, but it does show an anterior subluxation of the right mandibular condyle and mild ethmoid sinus  thickening 3. CBC and electrolytes are reassuring 4. TSH pending in the a.m. 5. UDS negative 6. EtOH level 98 7. Salicylate level less than 7 8. Admitting to stepdown for possible need of Precedex 2. EtOH withdrawal with delirium  1. CIWA protocol 2. Folate and thiamine supplements 3. Restraints for protection against self-harm and removing IV 4. 5 mg of Ativan, 10 of Geodon, 5 and Haldol given in the ED-patient may require Precedex, admit to stepdown 5. Continue to monitor 3. Transaminitis 1. AST 152, ALT 120, T bili 2.3 2. Cirrhosis likely secondary to alcohol use seen on ultrasound of abdomen, patent main portal vein with hepatopedal flow and no gallstones were also noted in the impression 3. Acute hepatitis panel pending for  completeness 4. Continue to monitor with CMP in the a.m. 4. Elevated BP without diagnosis of HTN 1. Elevated blood pressure readings while patient was acutely agitated 2. No pharmaceutical intervention required at this time 3. Recheck blood pressure when patient is calm/sedated 5. Elevated CK 1. CK 1365 2. Start IV hydration 6. Elevated ammonia 1. Start lactulose 2. Recheck in the AM   DVT Prophylaxis-   Heparin - SCDs   AM Labs Ordered, also please review Full Orders  Family Communication: No family at bedside Code Status:  Full  Admission status: Inpatient :The appropriate admission status for this patient is INPATIENT. Inpatient status is judged to be reasonable and necessary in order to provide the required intensity of service to ensure the patient's safety. The patient's presenting symptoms, physical exam findings, and initial radiographic and laboratory data in the context of their chronic comorbidities is felt to place them at high risk for further clinical deterioration. Furthermore, it is not anticipated that the patient will be medically stable for discharge from the hospital within 2 midnights of admission. The following factors support the  admission status of inpatient.     The patient's presenting symptoms include hallucinations. The worrisome physical exam findings include diaphoretic, tachycardic, agitated, hypertensive The chronic co-morbidities include EtOH dependence       * I certify that at the point of admission it is my clinical judgment that the patient will require inpatient hospital care spanning beyond 2 midnights from the point of admission due to high intensity of service, high risk for further deterioration and high frequency of surveillance required.*  Time spent in minutes : 62   Rutledge Selsor B Zierle-Ghosh DO

## 2019-11-13 NOTE — ED Notes (Signed)
D5 in LR is not available in this department, materials contacted and they will bring some up.

## 2019-11-13 NOTE — ED Notes (Signed)
[name], daughter would like an update on her father, [phone].

## 2019-11-13 NOTE — ED Notes (Signed)
Precedex drip ran out, pharmacy called to tube more, given IV ativan dose to cover. Patient is agitated, fighting against restraints, cursing staff out in spanish.

## 2019-11-13 NOTE — Consult Note (Signed)
NAME:  [name], MRN:  [id number], DOB:  14-Dec-1980, LOS: 0 ADMISSION DATE:  11/12/2019, CONSULTATION DATE:  8/13 REFERRING MD:  Jonathon Bellows, CHIEF COMPLAINT:  Agitated delirium and etoh wd    Brief History   39 year old alcoholic male admitted on 8/12 with agitated delirium/delirium tremens.  Agitated and combative in the emergency room.  Received a total of 5 mg of Ativan, 10 mg of Geodon, and 5 mg of Haldol at which time he was started on a Precedex infusion and pulmonary asked to evaluate  History of present illness   39 year old non-smoking is speaking male who presented to the emergency room on 8/12 under IVC. Was reported to have used cocaine on 8/8, also heavily drinks.  With noted to have hallucinations first noted on 8/10.  Had actually been seen in the ER, released.  Return once again after again being found outside the hospital hallucinating.  He was seen by psychiatry with plan to initiate with CIWA protocol.  While in the emergency room he became progressively agitated, received 5 doses of Ativan, Geodon 10 mg and 5 mg of Haldol.  He was then placed on Precedex infusion and because of this critical care was consulted Past Medical History  No known medical history Significant Hospital Events   8/12: Admitted with withdrawal CIWA started 8/13: Worsening agitation Precedex infusion started  Consults:  Critical care consulted  Procedures:  Not indicated  Significant Diagnostic Tests:  8/12: Urine drug screen negative Alcohol level 98 Salicylate level less than 7 Tylenol level less than 10  Micro Data:    Antimicrobials:    Interim history/subjective:  Sedated on Precedex infusion  Objective   Blood pressure (Abnormal) 163/89, pulse 90, temperature 99.3 F (37.4 C), temperature source Rectal, resp. rate (Abnormal) 25, height 5\' 6"  (1.676 m), weight 81.6 kg, SpO2 100 %.        Intake/Output Summary (Last 24 hours) at 11/13/2019 1145 Last data filed at  11/12/2019 2218 Gross per 24 hour  Intake 500 ml  Output no documentation  Net 500 ml   Filed Weights   11/12/19 1123  Weight: 81.6 kg    Examination: General: Obese 39 year old non-English-speaking male currently sedated on Precedex drip  HENT: Normocephalic atraumatic no jugular venous distention mucous membranes moist sclera nonicteric Lungs: Some snoring respirations otherwise clear no accessory use currently nasal cannula support Cardiovascular: Regular rate and rhythm Abdomen: soft not tender + bowel sounds  Extremities: warm and dry  Neuro: confused moves all extremities  GU: voids   Resolved Hospital Problem list     Assessment & Plan:   Acute toxic and metabolic encephalopathy in setting of delirium tremens  -required escalating benzo dosing and antipsychotics  Plan Admit to ICU/stepdown Cont precedex gtt.  Dc CIWA  Will placed on low dose scheduled ativan to prevent seizure Cont thiamine and folate.  Anticipate transition to clonidine and librium in next 24 hrs when can take po  Agree w/ lactulose  Tele monitoring K goal > 4. Mg goal > 2  Cirrhosis w/ Elevated LFTs in setting of acute ETOH  Plan Trend LFTs  No more drinking   Elevated total CKs Plan Agree w/ IV hydration      Best practice:  Diet: NPO Pain/Anxiety/Delirium protocol (if indicated): precedex VAP protocol (if indicated): NA DVT prophylaxis:scd GI prophylaxis: NA Glucose control: na Mobility: BR Code Status: full code  Family Communication: pending  Disposition: ICU stepdown   Labs   CBC: Recent Labs  Lab 11/10/19 2329 11/12/19 1300 11/13/19 0441  WBC 8.3 8.3 6.1  NEUTROABS  --  4.8  --   HGB 14.4 14.3 12.9*  HCT 43.2 42.6 39.7  MCV 92.7 92.4 95.9  PLT 91* 114* 85*    Basic Metabolic Panel: Recent Labs  Lab 11/10/19 2329 11/12/19 1300 11/13/19 0441  NA 130* 133* 138  K 3.0* 3.8 3.4*  CL 95* 98 105  CO2 22 26 24   GLUCOSE 150* 102* 127*  BUN 11 9 13     CREATININE 0.64 0.56* 0.50*  CALCIUM 9.6 9.1 8.3*  MG  --   --  2.3  PHOS  --   --  4.4   GFR: Estimated Creatinine Clearance: 125.6 mL/min (A) (by C-G formula based on SCr of 0.5 mg/dL (L)). Recent Labs  Lab 11/10/19 2329 11/12/19 1300 11/13/19 0441  WBC 8.3 8.3 6.1    Liver Function Tests: Recent Labs  Lab 11/10/19 2329 11/12/19 1300 11/13/19 0441  AST 101* 152* 181*  ALT 78* 120* 141*  ALKPHOS 94 90 76  BILITOT 1.9* 2.3* 1.7*  PROT 8.6* 8.6* 7.6  ALBUMIN 4.5 4.6 3.9   No results for input(s): LIPASE, AMYLASE in the last 168 hours. Recent Labs  Lab 11/12/19 2339 11/13/19 0455  AMMONIA 71* 57*    ABG No results found for: PHART, PCO2ART, PO2ART, HCO3, TCO2, ACIDBASEDEF, O2SAT   Coagulation Profile: No results for input(s): INR, PROTIME in the last 168 hours.  Cardiac Enzymes: Recent Labs  Lab 11/12/19 2339 11/13/19 0441  CKTOTAL 1,365* 976*    HbA1C: No results found for: HGBA1C  CBG: No results for input(s): GLUCAP in the last 168 hours.  Review of Systems:   Not able  Past Medical History  He,  has no past medical history on file.   Surgical History   History reviewed. No pertinent surgical history.   Social History   reports current alcohol use. He reports current drug use. Drug: Cocaine.   Family History   His family history is not on file.   Allergies Allergies  Allergen Reactions  . Penicillins Other (See Comments)    Unknown reaction     Home Medications  Prior to Admission medications   Medication Sig Start Date End Date Taking? Authorizing Provider  hydrOXYzine (ATARAX/VISTARIL) 25 MG tablet Take 1 tablet (25 mg total) by mouth every 8 (eight) hours as needed. 11/11/19   11/15/19, PA-C     Critical care time: 31 min      01/11/20 ACNP-BC [facility] Pager # [phone] OR # [phone] if no answer

## 2019-11-13 NOTE — Progress Notes (Signed)
Patient seen and examined personally, I reviewed the chart, history and physical and admission note, done by admitting physician this morning and agree with the same with following addendum.  Please refer to the morning admission note for more detailed plan of care.  Briefly,  39yom brought to the ED under IVC.  Reportedly having hallucinations since Tuesday after having cocaine on Sunday was seen in the ER on Tuesday and cleared to go home but continued to have hallucination and responding to internal stimuli.  Reportedly "knocking on neighbors doors asking for the reports", has history of "amphetamine induced psychotic disorder". Apparently no alcohol in last 5 days but uses cocaine and drinks every day. He was seen by psych recommended inpatient detox placed on CIWA protocol, overnight patient remains altered agitated has been on four-point restraint.  He has received Haldol 5 mg IV x1, has received multiple doses of IV Ativan. Used interpreter line, did not tell me his name, appeared very tachycardic in 120s tachypneic sweaty and hypertensive in 180s.  Was mumbling words in Spanish , was saying he is in [place]. Remains in four-point restraint.  Delirium tremens with hallucination/agitation/confusion under IVC by family brought to ED: ct head NAD,Etoh level  98 on admission.Patient remains agitated despite multiple doses of Ativan also received Geodon and Haldol during night.  We will switch him to Precedex drip, PCCM consulted/discussed, keep n.p.o., IV thiamine folate, IV fluid hydration Rhabdomyolysis atraumatic likely from resuscitation, CK on admission 1365, continue IV fluids, recheck CK level downtrending. Alcohol abuse-will need detox, currently switching to Precedex. Transaminitis AST ALT 181/141 previously 152/120 total bili 1.7.  Likely from alcoholic hepatitis right upper quadrant ultrasound abdomen shows cirrhosis, no gallstone.Patent main portal vein. Hypokalemia replete with IV  potassium. Hepatic cirrhosis/hyperammonemia at 57,on lactulose enema as needed Thrombocytopenia platelet count 80 5K,will hold off on subcu heparin.  Add SCD. I called and updated his daughter Gunnar Fusi.Per daughter he drinks daily starting from morning till he goes to sleep.Has had withdrawal before.

## 2019-11-14 LAB — COMPREHENSIVE METABOLIC PANEL
ALT: 95 U/L — ABNORMAL HIGH (ref 0–44)
AST: 83 U/L — ABNORMAL HIGH (ref 15–41)
Albumin: 3.6 g/dL (ref 3.5–5.0)
Alkaline Phosphatase: 73 U/L (ref 38–126)
Anion gap: 9 (ref 5–15)
BUN: 8 mg/dL (ref 6–20)
CO2: 22 mmol/L (ref 22–32)
Calcium: 8.3 mg/dL — ABNORMAL LOW (ref 8.9–10.3)
Chloride: 106 mmol/L (ref 98–111)
Creatinine, Ser: 0.42 mg/dL — ABNORMAL LOW (ref 0.61–1.24)
GFR calc Af Amer: >60 mL/min (ref >60)
GFR calc non Af Amer: >60 mL/min (ref >60)
Glucose, Bld: 145 mg/dL — ABNORMAL HIGH (ref 70–99)
Potassium: 3.6 mmol/L (ref 3.5–5.1)
Sodium: 137 mmol/L (ref 135–145)
Total Bilirubin: 2.2 mg/dL — ABNORMAL HIGH (ref 0.3–1.2)
Total Protein: 7.1 g/dL (ref 6.5–8.1)

## 2019-11-14 LAB — CBC
HCT: 39.5 % (ref 39.0–52.0)
Hemoglobin: 12.9 g/dL — ABNORMAL LOW (ref 13.0–17.0)
MCH: 31.4 pg (ref 26.0–34.0)
MCHC: 32.7 g/dL (ref 30.0–36.0)
MCV: 96.1 fL (ref 80.0–100.0)
Platelets: 82 10*3/uL — ABNORMAL LOW (ref 150–400)
RBC: 4.11 MIL/uL — ABNORMAL LOW (ref 4.22–5.81)
RDW: 14.8 % (ref 11.5–15.5)
WBC: 8.2 10*3/uL (ref 4.0–10.5)
nRBC: 0 % (ref 0.0–0.2)

## 2019-11-14 LAB — CK: Total CK: 597 U/L — ABNORMAL HIGH (ref 49–397)

## 2019-11-14 LAB — MAGNESIUM: Magnesium: 2.1 mg/dL (ref 1.7–2.4)

## 2019-11-14 MED ORDER — LABETALOL HCL 5 MG/ML IV SOLN
10.0000 mg | INTRAVENOUS | Status: DC | PRN
  Administered 2019-11-14 – 2019-11-15 (×3): 20 mg via INTRAVENOUS
  Filled 2019-11-14 (×3): qty 4

## 2019-11-14 MED ORDER — CLONIDINE HCL 0.1 MG PO TABS
0.1000 mg | ORAL_TABLET | Freq: Three times a day (TID) | ORAL | Status: DC
  Administered 2019-11-14 – 2019-11-15 (×3): 0.1 mg via ORAL
  Filled 2019-11-14 (×3): qty 1

## 2019-11-14 MED ORDER — LACTULOSE ENEMA
300.0000 mL | Freq: Every day | ORAL | Status: DC | PRN
  Filled 2019-11-14: qty 300

## 2019-11-14 MED ORDER — CHLORHEXIDINE GLUCONATE CLOTH 2 % EX PADS
6.0000 | MEDICATED_PAD | Freq: Every day | CUTANEOUS | Status: DC
  Administered 2019-11-14 – 2019-11-17 (×4): 6 via TOPICAL

## 2019-11-14 NOTE — Progress Notes (Addendum)
eLink Physician-Brief Progress Note Patient Name: Chett Taniguchi DOB: 09/06/1980 MRN: [id number]   Date of Service  11/14/2019  HPI/Events of Note  Pt admitted with ETOH delirium and ETOH liver disease.  eICU Interventions  New Patient Evaluation completed, soft restraints ordered, supportive care. Continue Precedex, PRN iv Labetalol for hypertension.        Thomasene Lot Reanna Scoggin 11/14/2019, 2:24 AM

## 2019-11-14 NOTE — Progress Notes (Signed)
PROGRESS NOTE    [name]  WUJ:[id number] DOB: 1981-02-21 DOA: 11/12/2019 PCP: Patient, No Pcp Per   Chief Complaint  Patient presents with  . IVC  . Psychiatric Evaluation    Brief Narrative: 39yom brought to the ED under IVC.  Reportedly having hallucinations since Tuesday after having cocaine on Sunday was seen in the ER on Tuesday and cleared to go home but continued to have hallucination and responding to internal stimuli.  Reportedly "knocking on neighbors doors asking for the reports", has history of "amphetamine induced psychotic disorder". Apparently no alcohol in last 5 days but uses cocaine and drinks every day. He was seen by psych recommended inpatient detox placed on CIWA protocol, overnight patient remains altered agitated has been on four-point restraint.  He has received Haldol 5 mg IV x1, has received multiple doses of IV Ativan. Used interpreter line, did not tell me his name, appeared very tachycardic in 120s tachypneic sweaty and hypertensive in 180s.  Was mumbling words in Spanish , was saying he is in [place]. Patient was placed on Precedex admitted to stepdown as he was needing multiple doses of Ativan also sedatives and 4 point restraints. Subjective:  Alert awake follows commands.  Precedex and restraint in place. Speaking Spanish language. Asking when he is. Able to tell me his name, denies any pain.  Assessment & Plan:  Alcohol withdrawal delirium tremens with hallucination/agitation/confusion under IVC by family brought to ED: ct head NAD,Etoh level  98 on admission.Patient on Precedex drip, nursing was weaning down this morning patient was following commands alert awake able to tell me his name.  Does not appear to be sweating and is not  tachycardic today.  PCCM was consulted-he was placed on Ativan weaning dose and off CIWA scale.  Continue IV thiamine folate.  Remains on restraint, continue supportive measures.  Continue lactulose.  Once he  is more alert awake and able to take po, can transition to clonidine and Librium.  Rhabdomyolysis atraumatic likely from agitation.  CK improving.  Continue gentle fluids.    Alcohol abuse-continue detox withdrawal treatment as #1.    Transaminitis improving- from alcoholic hepatitis, viral apparatus panel negative.  Right upper quadrant ultrasound abdomen shows cirrhosis, no gallstone.Patent main portal vein. Recent Labs  Lab 11/10/19 2329 11/12/19 1300 11/13/19 0441 11/14/19 0241  AST 101* 152* 181* 83*  ALT 78* 120* 141* 95*  ALKPHOS 94 90 76 73  BILITOT 1.9* 2.3* 1.7* 2.2*  PROT 8.6* 8.6* 7.6 7.1  ALBUMIN 4.5 4.6 3.9 3.6   Hypokalemia repleted  Hypertension likely from alcohol withdrawal continue as needed medication  Hepatic cirrhosis/hyperammonemia at 57: Monitor ammonia, lactulose as tolerated.  Thrombocytopenia platelet count 82K,will hold off on subcu heparin Recent Labs  Lab 11/10/19 2329 11/12/19 1300 11/13/19 0441 11/14/19 0241  PLT 91* 114* 85* 82*   DVT prophylaxis: Place and maintain sequential compression device Start: 11/13/19 0812 SCDs Start: 11/13/19 0106 Code Status:   Code Status: Full Code  Family Communication: plan of care discussed with patient's daughter previously  Status is: Inpatient  Remains inpatient appropriate because:IV treatments appropriate due to intensity of illness or inability to take PO, Inpatient level of care appropriate due to severity of illness and Ongoing detox treatment with Precedex   Dispo: The patient is from: Home              Anticipated d/c is to: Home  Anticipated d/c date is: 3 days              Patient currently is not medically stable to d/c.  Nutrition: Diet Order            Diet NPO time specified  Diet effective now                Consultants:see note  Procedures:see note Microbiology:see note Blood Culture No results found for: SDES, SPECREQUEST, CULT, REPTSTATUS  Other culture-see  note  Medications: Scheduled Meds: . Chlorhexidine Gluconate Cloth  6 each Topical Daily  . folic acid  1 mg Intravenous Daily  . lactulose  300 mL Rectal Once  . LORazepam  1 mg Intravenous Q4H   Followed by  . LORazepam  1 mg Intravenous Q6H   Followed by  . [START ON 11/15/2019] LORazepam  1 mg Intravenous Q8H  . multivitamin with minerals  1 tablet Oral Daily  . thiamine  100 mg Oral Daily   Or  . thiamine  100 mg Intravenous Daily   Continuous Infusions: . dexmedetomidine (PRECEDEX) IV infusion 0.7 mcg/kg/hr (11/14/19 0759)  . dextrose 5% lactated ringers 125 mL/hr at 11/13/19 1546    Antimicrobials: Anti-infectives (From admission, onward)   None     Objective: Vitals: Today's Vitals   11/14/19 0800 11/14/19 0900 11/14/19 1000 11/14/19 1100  BP: (!) 155/74 (!) 163/84 (!) 169/98 140/77  Pulse: 64 65 64 63  Resp: (!) 26 (!) 24 (!) 28 (!) 32  Temp: (!) 97.5 F (36.4 C)     TempSrc: Axillary     SpO2: 96% 96% 95% (!) 85%  Weight:      Height:      PainSc: 0-No pain       Intake/Output Summary (Last 24 hours) at 11/14/2019 1259 Last data filed at 11/14/2019 0338 Gross per 24 hour  Intake 1775.79 ml  Output 2500 ml  Net -724.21 ml   Filed Weights   11/12/19 1123  Weight: 81.6 kg   Weight change:    Intake/Output from previous day: 08/13 0701 - 08/14 0700 In: 1775.8 [I.V.:1625.8; IV Piggyback:150] Out: 2500 [Urine:2500] Intake/Output this shift: No intake/output data recorded.  Examination:  General exam: AAOx2 ,NAD, weak appearing. HEENT:Oral mucosa moist, Ear/Nose WNL grossly,dentition normal. Respiratory system: bilaterally clear,no wheezing or crackles,no use of accessory muscle, non tender. Cardiovascular system: S1 & S2 +, regular, No JVD. Gastrointestinal system: Abdomen soft, NT,ND, BS+. Nervous System:Alert, awake, moving extremities and grossly nonfocal Extremities: No edema, distal peripheral pulses palpable.  Skin: No rashes,no  icterus. MSK: Normal muscle bulk,tone, power  Data Reviewed: I have personally reviewed following labs and imaging studies CBC: Recent Labs  Lab 11/10/19 2329 11/12/19 1300 11/13/19 0441 11/14/19 0241  WBC 8.3 8.3 6.1 8.2  NEUTROABS  --  4.8  --   --   HGB 14.4 14.3 12.9* 12.9*  HCT 43.2 42.6 39.7 39.5  MCV 92.7 92.4 95.9 96.1  PLT 91* 114* 85* 82*   Basic Metabolic Panel: Recent Labs  Lab 11/10/19 2329 11/12/19 1300 11/13/19 0441 11/14/19 0241  NA 130* 133* 138 137  K 3.0* 3.8 3.4* 3.6  CL 95* 98 105 106  CO2 22 26 24 22   GLUCOSE 150* 102* 127* 145*  BUN 11 9 13 8   CREATININE 0.64 0.56* 0.50* 0.42*  CALCIUM 9.6 9.1 8.3* 8.3*  MG  --   --  2.3 2.1  PHOS  --   --  4.4  --  GFR: Estimated Creatinine Clearance: 125.6 mL/min (A) (by C-G formula based on SCr of 0.42 mg/dL (L)). Liver Function Tests: Recent Labs  Lab 11/10/19 2329 11/12/19 1300 11/13/19 0441 11/14/19 0241  AST 101* 152* 181* 83*  ALT 78* 120* 141* 95*  ALKPHOS 94 90 76 73  BILITOT 1.9* 2.3* 1.7* 2.2*  PROT 8.6* 8.6* 7.6 7.1  ALBUMIN 4.5 4.6 3.9 3.6   No results for input(s): LIPASE, AMYLASE in the last 168 hours. Recent Labs  Lab 11/12/19 2339 11/13/19 0455  AMMONIA 71* 57*   Coagulation Profile: No results for input(s): INR, PROTIME in the last 168 hours. Cardiac Enzymes: Recent Labs  Lab 11/12/19 2339 11/13/19 0441 11/14/19 0241  CKTOTAL 1,365* 976* 597*   BNP (last 3 results) No results for input(s): PROBNP in the last 8760 hours. HbA1C: No results for input(s): HGBA1C in the last 72 hours. CBG: No results for input(s): GLUCAP in the last 168 hours. Lipid Profile: No results for input(s): CHOL, HDL, LDLCALC, TRIG, CHOLHDL, LDLDIRECT in the last 72 hours. Thyroid Function Tests: Recent Labs    11/13/19 0220  TSH 1.943   Anemia Panel: No results for input(s): VITAMINB12, FOLATE, FERRITIN, TIBC, IRON, RETICCTPCT in the last 72 hours. Sepsis Labs: No results for  input(s): PROCALCITON, LATICACIDVEN in the last 168 hours.  Recent Results (from the past 240 hour(s))  SARS Coronavirus 2 by RT PCR (hospital order, performed in [facility] hospital lab) Nasopharyngeal Nasopharyngeal Swab     Status: None   Collection Time: 11/11/19  3:12 PM   Specimen: Nasopharyngeal Swab  Result Value Ref Range Status   SARS Coronavirus 2 NEGATIVE NEGATIVE Final    Comment: (NOTE) SARS-CoV-2 target nucleic acids are NOT DETECTED.  The SARS-CoV-2 RNA is generally detectable in upper and lower respiratory specimens during the acute phase of infection. The lowest concentration of SARS-CoV-2 viral copies this assay can detect is 250 copies / mL. A negative result does not preclude SARS-CoV-2 infection and should not be used as the sole basis for treatment or other patient management decisions.  A negative result may occur with improper specimen collection / handling, submission of specimen other than nasopharyngeal swab, presence of viral mutation(s) within the areas targeted by this assay, and inadequate number of viral copies (<250 copies / mL). A negative result must be combined with clinical observations, patient history, and epidemiological information.  Fact Sheet for Patients:   BoilerBrush.com.cy  Fact Sheet for Healthcare Providers: https://pope.com/  This test is not yet approved or  cleared by the [place] FDA and has been authorized for detection and/or diagnosis of SARS-CoV-2 by FDA under an Emergency Use Authorization (EUA).  This EUA will remain in effect (meaning this test can be used) for the duration of the COVID-19 declaration under Section 564(b)(1) of the Act, 21 U.S.C. section 360bbb-3(b)(1), unless the authorization is terminated or revoked sooner.  Performed at [facility], [address], [place], [place] [address]   SARS Coronavirus 2 by RT PCR (hospital order, performed in  [facility] hospital lab) Nasopharyngeal Nasopharyngeal Swab     Status: None   Collection Time: 11/12/19  4:45 PM   Specimen: Nasopharyngeal Swab  Result Value Ref Range Status   SARS Coronavirus 2 NEGATIVE NEGATIVE Final    Comment: (NOTE) SARS-CoV-2 target nucleic acids are NOT DETECTED.  The SARS-CoV-2 RNA is generally detectable in upper and lower respiratory specimens during the acute phase of infection. The lowest concentration of SARS-CoV-2 viral  copies this assay can detect is 250 copies / mL. A negative result does not preclude SARS-CoV-2 infection and should not be used as the sole basis for treatment or other patient management decisions.  A negative result may occur with improper specimen collection / handling, submission of specimen other than nasopharyngeal swab, presence of viral mutation(s) within the areas targeted by this assay, and inadequate number of viral copies (<250 copies / mL). A negative result must be combined with clinical observations, patient history, and epidemiological information.  Fact Sheet for Patients:   BoilerBrush.com.cy  Fact Sheet for Healthcare Providers: https://pope.com/  This test is not yet approved or  cleared by the [place] FDA and has been authorized for detection and/or diagnosis of SARS-CoV-2 by FDA under an Emergency Use Authorization (EUA).  This EUA will remain in effect (meaning this test can be used) for the duration of the COVID-19 declaration under Section 564(b)(1) of the Act, 21 U.S.C. section 360bbb-3(b)(1), unless the authorization is terminated or revoked sooner.  Performed at [facility], [address], [place], [place] [address]       Radiology Studies: US Abdomen Limited  Result Date: 11/12/2019 CLINICAL DATA:  39 year old male with elevated LFTs. EXAM: ULTRASOUND ABDOMEN LIMITED RIGHT UPPER QUADRANT COMPARISON:  None. FINDINGS:  Gallbladder: No gallstones or wall thickening visualized. No sonographic Murphy sign noted by sonographer. Common bile duct: Diameter: 3 mm Liver: There is morphologic changes of cirrhosis. Portal vein is patent on color Doppler imaging with normal direction of blood flow towards the liver. Other: None. IMPRESSION: 1. Cirrhosis. 2. 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 3. No gallstone. Electronically Signed   By: Elgie Collard M.D.   On: 11/12/2019 15:37     LOS: 1 day   Lanae Boast, MD [facility]  11/14/2019, 12:59 PM

## 2019-11-14 NOTE — Progress Notes (Signed)
NAME:  [name], MRN:  [id number], DOB:  1980/12/15, LOS: 1 ADMISSION DATE:  11/12/2019, CONSULTATION DATE:  8/13 REFERRING MD:  Jonathon Bellows, CHIEF COMPLAINT:  Agitated delirium and etoh wd    Brief History   39 year old alcoholic male admitted on 8/12 with agitated delirium/delirium tremens.  Agitated and combative in the emergency room.  Received a total of 5 mg of Ativan, 10 mg of Geodon, and 5 mg of Haldol at which time he was started on a Precedex infusion and pulmonary asked to evaluate  History of present illness   39 year old non-smoking is speaking male who presented to the emergency room on 8/12 under IVC. Was reported to have used cocaine on 8/8, also heavily drinks.  With noted to have hallucinations first noted on 8/10.  Had actually been seen in the ER, released.  Return once again after again being found outside the hospital hallucinating.  He was seen by psychiatry with plan to initiate with CIWA protocol.  While in the emergency room he became progressively agitated, received 5 doses of Ativan, Geodon 10 mg and 5 mg of Haldol.  He was then placed on Precedex infusion and because of this critical care was consulted  Past Medical History  No known medical history  Significant Hospital Events   8/12: Admitted with withdrawal CIWA started 8/13: Worsening agitation Precedex infusion started  Consults:  Critical care consulted  8/13   Procedures:  Not indicated  Significant Diagnostic Tests:  8/12: Urine drug screen negative Alcohol level 98 Salicylate level less than 7 Tylenol level less than 10  Micro Data:  covid 19 PCR  8/12 neg   Antimicrobials:   Scheduled Meds: . Chlorhexidine Gluconate Cloth  6 each Topical Daily  . cloNIDine  0.1 mg Oral TID  . folic acid  1 mg Intravenous Daily  . lactulose  300 mL Rectal Once  . LORazepam  1 mg Intravenous Q6H   Followed by  . [START ON 11/15/2019] LORazepam  1 mg Intravenous Q8H  . multivitamin with minerals   1 tablet Oral Daily  . thiamine  100 mg Oral Daily   Or  . thiamine  100 mg Intravenous Daily   Continuous Infusions: . dexmedetomidine (PRECEDEX) IV infusion 0.4 mcg/kg/hr (11/14/19 1305)  . dextrose 5% lactated ringers 125 mL/hr at 11/13/19 1546   PRN Meds:.ibuprofen, labetalol, lactulose, [EXPIRED] LORazepam **FOLLOWED BY** LORazepam **FOLLOWED BY** [START ON 11/15/2019] LORazepam **FOLLOWED BY** [START ON 11/16/2019] LORazepam, [DISCONTINUED] ondansetron **OR** ondansetron (ZOFRAN) IV, polyethylene glycol  Interim history/subjective:  Lying flat it bed on RA, appears calmer on precedex being tapered  Objective   Blood pressure 140/77, pulse 63, temperature 100 F (37.8 C), temperature source Axillary, resp. rate (!) 32, height 5\' 6"  (1.676 m), weight 81.6 kg, SpO2 (!) 85 %.        Intake/Output Summary (Last 24 hours) at 11/14/2019 1529 Last data filed at 11/14/2019 11/16/2019 Gross per 24 hour  Intake 1675.79 ml  Output 2500 ml  Net -824.21 ml   Filed Weights   11/12/19 1123  Weight: 81.6 kg    Examination: Tmax 100 / RA Pt slt sleepy  No jvd Oropharynx clear,  mucosa nl Neck supple Lungs with minimal  rhonchi bilaterally RRR no s3 or or sign murmur Abd obese with limited  excursion  Extr warm with no edema or clubbing noted      Resolved Hospital Problem list     Assessment & Plan:   Acute toxic and metabolic  encephalopathy in setting of delirium tremens  -required escalating benzo dosing and antipsychotics  Plan Admit to ICU/stepdown Cont precedex gtt. / ativan maint rx for now  Cont thiamine and folate.  Anticipate transition to clonidine and librium in next 24 hrs when can take po  Agree w/ lactulose  Tele monitoring K goal > 4. Mg goal > 2 Started clonidine 0.1 tid 8/13   Cirrhosis w/ Elevated LFTs in setting of acute ETOH  Plan Trend of  LFTs is  downard  No more drinking   Elevated total CKs/ trending down - no evidence of significant rhabdo  related AKI now Plan Agree w/ IV hydration     Thrombocytopenia Lab Results  Component Value Date   PLT 82 (L) 11/14/2019   PLT 85 (L) 11/13/2019   PLT 114 (L) 11/12/2019   may have nadired ? hypersplenism     Best practice:  Diet: NPO Pain/Anxiety/Delirium protocol (if indicated): precedex VAP protocol (if indicated): NA DVT prophylaxis:scd GI prophylaxis: NA Glucose control: na Mobility: BR Code Status: full code  Family Communication: per [facility]  Disposition: ICU stepdown   Labs   CBC: Recent Labs  Lab 11/10/19 2329 11/12/19 1300 11/13/19 0441 11/14/19 0241  WBC 8.3 8.3 6.1 8.2  NEUTROABS  --  4.8  --   --   HGB 14.4 14.3 12.9* 12.9*  HCT 43.2 42.6 39.7 39.5  MCV 92.7 92.4 95.9 96.1  PLT 91* 114* 85* 82*    Basic Metabolic Panel: Recent Labs  Lab 11/10/19 2329 11/12/19 1300 11/13/19 0441 11/14/19 0241  NA 130* 133* 138 137  K 3.0* 3.8 3.4* 3.6  CL 95* 98 105 106  CO2 22 26 24 22   GLUCOSE 150* 102* 127* 145*  BUN 11 9 13 8   CREATININE 0.64 0.56* 0.50* 0.42*  CALCIUM 9.6 9.1 8.3* 8.3*  MG  --   --  2.3 2.1  PHOS  --   --  4.4  --    GFR: Estimated Creatinine Clearance: 125.6 mL/min (A) (by C-G formula based on SCr of 0.42 mg/dL (L)). Recent Labs  Lab 11/10/19 2329 11/12/19 1300 11/13/19 0441 11/14/19 0241  WBC 8.3 8.3 6.1 8.2    Liver Function Tests: Recent Labs  Lab 11/10/19 2329 11/12/19 1300 11/13/19 0441 11/14/19 0241  AST 101* 152* 181* 83*  ALT 78* 120* 141* 95*  ALKPHOS 94 90 76 73  BILITOT 1.9* 2.3* 1.7* 2.2*  PROT 8.6* 8.6* 7.6 7.1  ALBUMIN 4.5 4.6 3.9 3.6   No results for input(s): LIPASE, AMYLASE in the last 168 hours. Recent Labs  Lab 11/12/19 2339 11/13/19 0455  AMMONIA 71* 57*    ABG No results found for: PHART, PCO2ART, PO2ART, HCO3, TCO2, ACIDBASEDEF, O2SAT   Coagulation Profile: No results for input(s): INR, PROTIME in the last 168 hours.  Cardiac Enzymes: Recent Labs  Lab 11/12/19 2339  11/13/19 0441 11/14/19 0241  CKTOTAL 1,365* 976* 597*    HbA1C: No results found for: HGBA1C  CBG: No results for input(s): GLUCAP in the last 168 hours.     11/15/19, MD Pulmonary and Critical Care Medicine [facility] Cell [phone]   After 7:00 pm call Elink  [phone]

## 2019-11-15 LAB — COMPREHENSIVE METABOLIC PANEL
ALT: 69 U/L — ABNORMAL HIGH (ref 0–44)
AST: 45 U/L — ABNORMAL HIGH (ref 15–41)
Albumin: 3.4 g/dL — ABNORMAL LOW (ref 3.5–5.0)
Alkaline Phosphatase: 73 U/L (ref 38–126)
Anion gap: 11 (ref 5–15)
BUN: 7 mg/dL (ref 6–20)
CO2: 21 mmol/L — ABNORMAL LOW (ref 22–32)
Calcium: 8.4 mg/dL — ABNORMAL LOW (ref 8.9–10.3)
Chloride: 102 mmol/L (ref 98–111)
Creatinine, Ser: 0.38 mg/dL — ABNORMAL LOW (ref 0.61–1.24)
GFR calc Af Amer: >60 mL/min (ref >60)
GFR calc non Af Amer: >60 mL/min (ref >60)
Glucose, Bld: 138 mg/dL — ABNORMAL HIGH (ref 70–99)
Potassium: 3.5 mmol/L (ref 3.5–5.1)
Sodium: 134 mmol/L — ABNORMAL LOW (ref 135–145)
Total Bilirubin: 2 mg/dL — ABNORMAL HIGH (ref 0.3–1.2)
Total Protein: 7.1 g/dL (ref 6.5–8.1)

## 2019-11-15 LAB — CK: Total CK: 305 U/L (ref 49–397)

## 2019-11-15 LAB — CBC
HCT: 39.7 % (ref 39.0–52.0)
Hemoglobin: 13.4 g/dL (ref 13.0–17.0)
MCH: 31.8 pg (ref 26.0–34.0)
MCHC: 33.8 g/dL (ref 30.0–36.0)
MCV: 94.1 fL (ref 80.0–100.0)
Platelets: 84 10*3/uL — ABNORMAL LOW (ref 150–400)
RBC: 4.22 MIL/uL (ref 4.22–5.81)
RDW: 14.6 % (ref 11.5–15.5)
WBC: 6.7 10*3/uL (ref 4.0–10.5)
nRBC: 0 % (ref 0.0–0.2)

## 2019-11-15 LAB — MRSA PCR SCREENING: MRSA by PCR: NEGATIVE

## 2019-11-15 LAB — AMMONIA: Ammonia: 79 umol/L — ABNORMAL HIGH (ref 9–35)

## 2019-11-15 MED ORDER — ORAL CARE MOUTH RINSE
15.0000 mL | Freq: Two times a day (BID) | OROMUCOSAL | Status: DC
  Administered 2019-11-16 – 2019-11-19 (×7): 15 mL via OROMUCOSAL

## 2019-11-15 MED ORDER — CLONIDINE HCL 0.1 MG PO TABS
0.1000 mg | ORAL_TABLET | Freq: Once | ORAL | Status: AC
  Administered 2019-11-15: 0.1 mg via ORAL
  Filled 2019-11-15: qty 1

## 2019-11-15 MED ORDER — CLONIDINE HCL 0.1 MG PO TABS
0.2000 mg | ORAL_TABLET | Freq: Three times a day (TID) | ORAL | Status: DC
  Administered 2019-11-15 – 2019-11-17 (×6): 0.2 mg via ORAL
  Filled 2019-11-15 (×6): qty 2

## 2019-11-15 MED ORDER — TRAMADOL HCL 50 MG PO TABS
50.0000 mg | ORAL_TABLET | Freq: Four times a day (QID) | ORAL | Status: DC | PRN
  Administered 2019-11-15 (×2): 50 mg via ORAL
  Filled 2019-11-15 (×2): qty 1

## 2019-11-15 NOTE — Progress Notes (Signed)
PROGRESS NOTE    [name]  OZH:[id number] DOB: Mar 21, 1981 DOA: 11/12/2019 PCP: Patient, No Pcp Per   Chief Complaint  Patient presents with  . IVC  . Psychiatric Evaluation    Brief Narrative: 39yom brought to the ED under IVC.  Reportedly having hallucinations since Tuesday after having cocaine on Sunday was seen in the ER on Tuesday and cleared to go home but continued to have hallucination and responding to internal stimuli.  Reportedly "knocking on neighbors doors asking for the reports", has history of "amphetamine induced psychotic disorder". Apparently no alcohol in last 5 days but uses cocaine and drinks every day. He was seen by psych recommended inpatient detox placed on CIWA protocol, overnight patient remains altered agitated has been on four-point restraint.  He has received Haldol 5 mg IV x1, has received multiple doses of IV Ativan. Used interpreter line, did not tell me his name, appeared very tachycardic in 120s tachypneic sweaty and hypertensive in 180s.  Was mumbling words in Spanish , was saying he is in [place]. Patient was placed on Precedex admitted to stepdown as he was needing multiple doses of Ativan also sedatives and 4 point restraints.  Subjective: alert awake oriented to hospital, president but not to current date, does not know how he ended in hospital. Aware that he is going through detox, used interpretor services to communicate. feels better today. Still on precedex drip. One to one sitter in place.  Assessment & Plan:  Alcohol withdrawal delirium tremens with hallucination/agitation/confusion under IVC by family brought to ED: CT head NAD,Etoh level  98 on admission.Cont Precedex drip, scheduled ativan, started on clonidine tid(8/14),PCCM input appreciated, cont to wean precedex.  Cont IV thiamine and folate. SLP eval for diet.  Cont ivf, supportive care.  Rhabdomyolysis atraumatic likely from agitation.  CK stable, on ivf  Alcohol  abuse-continue detox withdrawal treatment as #1.    Transaminitis: alcoholic hepatitis. lft improving.Viral panel panel negative.  Right upper quadrant ultrasound abdomen shows cirrhosis, no gallstone.Patent main portal vein. lfts improving. Recent Labs  Lab 11/10/19 2329 11/12/19 1300 11/13/19 0441 11/14/19 0241 11/15/19 0251  AST 101* 152* 181* 83* 45*  ALT 78* 120* 141* 95* 69*  ALKPHOS 94 90 76 73 73  BILITOT 1.9* 2.3* 1.7* 2.2* 2.0*  PROT 8.6* 8.6* 7.6 7.1 7.1  ALBUMIN 4.5 4.6 3.9 3.6 3.4*   Hypokalemia resolved.  Hypertension likely from alcohol withdrawal- BP improving. continue as needed medication  Hepatic cirrhosis/hyperammonemia: Monitor ammonia, lactulose as tolerated recatlly while NPO.  Ammonia up at 79.  Metabolic acidosis with bicarb at 21.  Continue IV fluids.  Thrombocytopenia platelet count low stable. Monitor Recent Labs  Lab 11/10/19 2329 11/12/19 1300 11/13/19 0441 11/14/19 0241 11/15/19 0251  PLT 91* 114* 85* 82* 84*   DVT prophylaxis: Place and maintain sequential compression device Start: 11/13/19 0812 SCDs Start: 11/13/19 0106 Code Status:   Code Status: Full Code  Family Communication: plan of care discussed with patient's daughter previously  Status is: Inpatient  Remains inpatient appropriate because:IV treatments appropriate due to intensity of illness or inability to take PO, Inpatient level of care appropriate due to severity of illness and Ongoing detox treatment with Precedex   Dispo: The patient is from: Home              Anticipated d/c is to: Home              Anticipated d/c date is: 3 days  Patient currently is not medically stable to d/c.  Remains in stepdown unit.  Nutrition: Diet Order            Diet NPO time specified  Diet effective now                Consultants:see note  Procedures:see note Microbiology:see note Blood Culture No results found for: SDES, SPECREQUEST, CULT, REPTSTATUS  Other  culture-see note  Medications: Scheduled Meds: . Chlorhexidine Gluconate Cloth  6 each Topical Daily  . cloNIDine  0.1 mg Oral TID  . folic acid  1 mg Intravenous Daily  . lactulose  300 mL Rectal Once  . LORazepam  1 mg Intravenous Q6H   Followed by  . LORazepam  1 mg Intravenous Q8H  . multivitamin with minerals  1 tablet Oral Daily  . thiamine  100 mg Oral Daily   Or  . thiamine  100 mg Intravenous Daily   Continuous Infusions: . dexmedetomidine (PRECEDEX) IV infusion 0.3 mcg/kg/hr (11/15/19 0417)  . dextrose 5% lactated ringers 125 mL/hr at 11/15/19 0017    Antimicrobials: Anti-infectives (From admission, onward)   None     Objective: Vitals: Today's Vitals   11/15/19 0000 11/15/19 0200 11/15/19 0320 11/15/19 0404  BP: (!) 168/89 (!) 187/90 126/76   Pulse: 64 (!) 58 69   Resp: (!) 26 (!) 28 (!) 31   Temp: 99 F (37.2 C)   98.7 F (37.1 C)  TempSrc: Axillary   Axillary  SpO2: 97% 96% 100%   Weight:      Height:      PainSc:        Intake/Output Summary (Last 24 hours) at 11/15/2019 0724 Last data filed at 11/15/2019 0612 Gross per 24 hour  Intake --  Output 2300 ml  Net -2300 ml   Filed Weights   11/12/19 1123  Weight: 81.6 kg   Weight change:    Intake/Output from previous day: 08/14 0701 - 08/15 0700 In: -  Out: 2300 [Urine:2300] Intake/Output this shift: No intake/output data recorded.  Examination: General exam: AAOx2 , NAD, weak appearing. HEENT:Oral mucosa moist, Ear/Nose WNL grossly, dentition normal. Respiratory system: bilaterally clear,no wheezing or crackles,no use of accessory muscle Cardiovascular system: S1 & S2 +, No JVD,. Gastrointestinal system: Abdomen soft, NT,ND, BS+ Nervous System:Alert, awake, moving extremities and grossly nonfocal Extremities: No edema, distal peripheral pulses palpable.  Skin: No rashes,no icterus. MSK: Normal muscle bulk,tone, power    Data Reviewed: I have personally reviewed following labs and  imaging studies CBC: Recent Labs  Lab 11/10/19 2329 11/12/19 1300 11/13/19 0441 11/14/19 0241 11/15/19 0251  WBC 8.3 8.3 6.1 8.2 6.7  NEUTROABS  --  4.8  --   --   --   HGB 14.4 14.3 12.9* 12.9* 13.4  HCT 43.2 42.6 39.7 39.5 39.7  MCV 92.7 92.4 95.9 96.1 94.1  PLT 91* 114* 85* 82* 84*   Basic Metabolic Panel: Recent Labs  Lab 11/10/19 2329 11/12/19 1300 11/13/19 0441 11/14/19 0241 11/15/19 0251  NA 130* 133* 138 137 134*  K 3.0* 3.8 3.4* 3.6 3.5  CL 95* 98 105 106 102  CO2 22 26 24 22  21*  GLUCOSE 150* 102* 127* 145* 138*  BUN 11 9 13 8 7   CREATININE 0.64 0.56* 0.50* 0.42* 0.38*  CALCIUM 9.6 9.1 8.3* 8.3* 8.4*  MG  --   --  2.3 2.1  --   PHOS  --   --  4.4  --   --  GFR: Estimated Creatinine Clearance: 125.6 mL/min (A) (by C-G formula based on SCr of 0.38 mg/dL (L)). Liver Function Tests: Recent Labs  Lab 11/10/19 2329 11/12/19 1300 11/13/19 0441 11/14/19 0241 11/15/19 0251  AST 101* 152* 181* 83* 45*  ALT 78* 120* 141* 95* 69*  ALKPHOS 94 90 76 73 73  BILITOT 1.9* 2.3* 1.7* 2.2* 2.0*  PROT 8.6* 8.6* 7.6 7.1 7.1  ALBUMIN 4.5 4.6 3.9 3.6 3.4*   No results for input(s): LIPASE, AMYLASE in the last 168 hours. Recent Labs  Lab 11/12/19 2339 11/13/19 0455 11/15/19 0251  AMMONIA 71* 57* 79*   Coagulation Profile: No results for input(s): INR, PROTIME in the last 168 hours. Cardiac Enzymes: Recent Labs  Lab 11/12/19 2339 11/13/19 0441 11/14/19 0241 11/15/19 0251  CKTOTAL 1,365* 976* 597* 305   BNP (last 3 results) No results for input(s): PROBNP in the last 8760 hours. HbA1C: No results for input(s): HGBA1C in the last 72 hours. CBG: No results for input(s): GLUCAP in the last 168 hours. Lipid Profile: No results for input(s): CHOL, HDL, LDLCALC, TRIG, CHOLHDL, LDLDIRECT in the last 72 hours. Thyroid Function Tests: Recent Labs    11/13/19 0220  TSH 1.943   Anemia Panel: No results for input(s): VITAMINB12, FOLATE, FERRITIN, TIBC, IRON,  RETICCTPCT in the last 72 hours. Sepsis Labs: No results for input(s): PROCALCITON, LATICACIDVEN in the last 168 hours.  Recent Results (from the past 240 hour(s))  SARS Coronavirus 2 by RT PCR (hospital order, performed in [facility] hospital lab) Nasopharyngeal Nasopharyngeal Swab     Status: None   Collection Time: 11/11/19  3:12 PM   Specimen: Nasopharyngeal Swab  Result Value Ref Range Status   SARS Coronavirus 2 NEGATIVE NEGATIVE Final    Comment: (NOTE) SARS-CoV-2 target nucleic acids are NOT DETECTED.  The SARS-CoV-2 RNA is generally detectable in upper and lower respiratory specimens during the acute phase of infection. The lowest concentration of SARS-CoV-2 viral copies this assay can detect is 250 copies / mL. A negative result does not preclude SARS-CoV-2 infection and should not be used as the sole basis for treatment or other patient management decisions.  A negative result may occur with improper specimen collection / handling, submission of specimen other than nasopharyngeal swab, presence of viral mutation(s) within the areas targeted by this assay, and inadequate number of viral copies (<250 copies / mL). A negative result must be combined with clinical observations, patient history, and epidemiological information.  Fact Sheet for Patients:   BoilerBrush.com.cyhttps://www.fda.gov/media/136312/download  Fact Sheet for Healthcare Providers: https://pope.com/https://www.fda.gov/media/136313/download  This test is not yet approved or  cleared by the [place] FDA and has been authorized for detection and/or diagnosis of SARS-CoV-2 by FDA under an Emergency Use Authorization (EUA).  This EUA will remain in effect (meaning this test can be used) for the duration of the COVID-19 declaration under Section 564(b)(1) of the Act, 21 U.S.C. section 360bbb-3(b)(1), unless the authorization is terminated or revoked sooner.  Performed at [facility], [address] [place], [place] [address]     SARS Coronavirus 2 by RT PCR (hospital order, performed in [facility] hospital lab) Nasopharyngeal Nasopharyngeal Swab     Status: None   Collection Time: 11/12/19  4:45 PM   Specimen: Nasopharyngeal Swab  Result Value Ref Range Status   SARS Coronavirus 2 NEGATIVE NEGATIVE Final    Comment: (NOTE) SARS-CoV-2 target nucleic acids are NOT DETECTED.  The SARS-CoV-2 RNA is generally detectable in upper and  lower respiratory specimens during the acute phase of infection. The lowest concentration of SARS-CoV-2 viral copies this assay can detect is 250 copies / mL. A negative result does not preclude SARS-CoV-2 infection and should not be used as the sole basis for treatment or other patient management decisions.  A negative result may occur with improper specimen collection / handling, submission of specimen other than nasopharyngeal swab, presence of viral mutation(s) within the areas targeted by this assay, and inadequate number of viral copies (<250 copies / mL). A negative result must be combined with clinical observations, patient history, and epidemiological information.  Fact Sheet for Patients:   BoilerBrush.com.cy  Fact Sheet for Healthcare Providers: https://pope.com/  This test is not yet approved or  cleared by the [place] FDA and has been authorized for detection and/or diagnosis of SARS-CoV-2 by FDA under an Emergency Use Authorization (EUA).  This EUA will remain in effect (meaning this test can be used) for the duration of the COVID-19 declaration under Section 564(b)(1) of the Act, 21 U.S.C. section 360bbb-3(b)(1), unless the authorization is terminated or revoked sooner.  Performed at [facility], [address], [place], [place] [address]       Radiology Studies: No results found.   LOS: 2 days   Lanae Boast, MD [facility]  11/15/2019, 7:24 AM

## 2019-11-15 NOTE — Progress Notes (Signed)
NAME:  [name], MRN:  [id number], DOB:  23-Oct-1980, LOS: 2 ADMISSION DATE:  11/12/2019, CONSULTATION DATE:  8/13 REFERRING MD:  Jonathon Bellows, CHIEF COMPLAINT:  Agitated delirium and etoh wd    Brief History   39 year old alcoholic male admitted on 8/12 with agitated delirium/delirium tremens.  Agitated and combative in the emergency room.  Received a total of 5 mg of Ativan, 10 mg of Geodon, and 5 mg of Haldol at which time he was started on a Precedex infusion and pulmonary asked to evaluate  History of present illness   39 year old non-smoking is speaking male who presented to the emergency room on 8/12 under IVC. Was reported to have used cocaine on 8/8, also heavily drinks.  With noted to have hallucinations first noted on 8/10.  Had actually been seen in the ER, released.  Return once again after again being found outside the hospital hallucinating.  He was seen by psychiatry with plan to initiate with CIWA protocol.  While in the emergency room he became progressively agitated, received 5 doses of Ativan, Geodon 10 mg and 5 mg of Haldol.  He was then placed on Precedex infusion and because of this critical care was consulted  Past Medical History  No known medical history  Significant Hospital Events   8/12: Admitted with withdrawal CIWA started 8/13: Worsening agitation Precedex infusion started    Consults:  Critical care consulted  8/13   Procedures:  Not indicated  Significant Diagnostic Tests:  8/12: Urine drug screen negative Alcohol level 98 Salicylate level less than 7 Tylenol level less than 10 ST swallow eval 8/15  Ok > regular/ thin liquids   Micro Data:  covid 19 PCR  8/12 neg   Antimicrobials:   Scheduled Meds:  Chlorhexidine Gluconate Cloth  6 each Topical Daily   cloNIDine  0.2 mg Oral TID   folic acid  1 mg Intravenous Daily   lactulose  300 mL Rectal Once   LORazepam  1 mg Intravenous Q8H   multivitamin with minerals  1 tablet Oral  Daily   thiamine  100 mg Oral Daily   Or   thiamine  100 mg Intravenous Daily   Continuous Infusions:  dexmedetomidine (PRECEDEX) IV infusion Stopped (11/15/19 1412)   dextrose 5% lactated ringers 125 mL/hr at 11/15/19 0811   PRN Meds:.ibuprofen, labetalol, lactulose, [EXPIRED] LORazepam **FOLLOWED BY** [EXPIRED] LORazepam **FOLLOWED BY** LORazepam **FOLLOWED BY** [START ON 11/16/2019] LORazepam, [DISCONTINUED] ondansetron **OR** ondansetron (ZOFRAN) IV, polyethylene glycol, traMADol  Interim history/subjective:  Looks really good today flat in bed s wob/ no complaints/ alert and oriented on just precedex 0.3 mcg/min   Objective   Blood pressure (!) 182/77, pulse 64, temperature 99.1 F (37.3 C), temperature source Oral, resp. rate (!) 28, height 5\' 6"  (1.676 m), weight 81.6 kg, SpO2 96 %.        Intake/Output Summary (Last 24 hours) at 11/15/2019 1534 Last data filed at 11/15/2019 0612 Gross per 24 hour  Intake --  Output 2300 ml  Net -2300 ml   Filed Weights   11/12/19 1123  Weight: 81.6 kg    Examination: Tmax 99.3 on RA / BP 180 systolic on clonidine 0.2 mg tid  Sitting up eating lunch No jvd Oropharynx clear,  mucosa nl Neck supple Lungs with min rhonchi bilaterally RRR no s3 or or sign murmur Abd obese/soft  with nl excursion  Extr warm with no edema or clubbing noted       Resolved Hospital Problem list  Assessment & Plan:   Acute toxic and metabolic encephalopathy in setting of delirium tremens  -required escalating benzo dosing and antipsychotics  Plan tivan maint rx for now and taper off precedex on clonidine Cont thiamine and folate.  Agree w/ lactulose  Tele monitoring K goal > 4. Mg goal > 2 Started clonidine 0.1 tid 8/14 and increased to 0.2 tid 8/15  Cirrhosis w/ Elevated LFTs in setting of acute ETOH  Plan Trend of  LFTs is  downard  No more drinking   Elevated total CKs/ trending down - no evidence of significant rhabdo related AKI  now Plan Agree w/ IV hydration     Thrombocytopenia Lab Results  Component Value Date   PLT 84 (L) 11/15/2019   PLT 82 (L) 11/14/2019   PLT 85 (L) 11/13/2019   may have nadired ? hypersplenism     Best practice:  Diet: NPO Pain/Anxiety/Delirium protocol (if indicated): precedex off pm 8/15  VAP protocol (if indicated): NA DVT prophylaxis:scd GI prophylaxis: NA Glucose control: na Mobility: BR Code Status: full code  Family Communication: per [facility]  Disposition: ICU stepdown   Labs   CBC: Recent Labs  Lab 11/10/19 2329 11/12/19 1300 11/13/19 0441 11/14/19 0241 11/15/19 0251  WBC 8.3 8.3 6.1 8.2 6.7  NEUTROABS  --  4.8  --   --   --   HGB 14.4 14.3 12.9* 12.9* 13.4  HCT 43.2 42.6 39.7 39.5 39.7  MCV 92.7 92.4 95.9 96.1 94.1  PLT 91* 114* 85* 82* 84*    Basic Metabolic Panel: Recent Labs  Lab 11/10/19 2329 11/12/19 1300 11/13/19 0441 11/14/19 0241 11/15/19 0251  NA 130* 133* 138 137 134*  K 3.0* 3.8 3.4* 3.6 3.5  CL 95* 98 105 106 102  CO2 22 26 24 22  21*  GLUCOSE 150* 102* 127* 145* 138*  BUN 11 9 13 8 7   CREATININE 0.64 0.56* 0.50* 0.42* 0.38*  CALCIUM 9.6 9.1 8.3* 8.3* 8.4*  MG  --   --  2.3 2.1  --   PHOS  --   --  4.4  --   --    GFR: Estimated Creatinine Clearance: 125.6 mL/min (A) (by C-G formula based on SCr of 0.38 mg/dL (L)). Recent Labs  Lab 11/12/19 1300 11/13/19 0441 11/14/19 0241 11/15/19 0251  WBC 8.3 6.1 8.2 6.7    Liver Function Tests: Recent Labs  Lab 11/10/19 2329 11/12/19 1300 11/13/19 0441 11/14/19 0241 11/15/19 0251  AST 101* 152* 181* 83* 45*  ALT 78* 120* 141* 95* 69*  ALKPHOS 94 90 76 73 73  BILITOT 1.9* 2.3* 1.7* 2.2* 2.0*  PROT 8.6* 8.6* 7.6 7.1 7.1  ALBUMIN 4.5 4.6 3.9 3.6 3.4*   No results for input(s): LIPASE, AMYLASE in the last 168 hours. Recent Labs  Lab 11/12/19 2339 11/13/19 0455 11/15/19 0251  AMMONIA 71* 57* 79*    ABG No results found for: PHART, PCO2ART, PO2ART, HCO3, TCO2,  ACIDBASEDEF, O2SAT   Coagulation Profile: No results for input(s): INR, PROTIME in the last 168 hours.  Cardiac Enzymes: Recent Labs  Lab 11/12/19 2339 11/13/19 0441 11/14/19 0241 11/15/19 0251  CKTOTAL 1,365* 976* 597* 305    HbA1C: No results found for: HGBA1C  CBG: No results for input(s): GLUCAP in the last 168 hours.     11/16/19, MD Pulmonary and Critical Care Medicine  Healthcare Cell [phone]   After 7:00 pm call Elink  [phone]

## 2019-11-15 NOTE — Evaluation (Addendum)
Clinical/Bedside Swallow Evaluation Patient Details  Name: [name] MRN: [id number] Date of Birth: September 13, 1980  Today's Date: 11/15/2019 Time: SLP Start Time (ACUTE ONLY): 1430 SLP Stop Time (ACUTE ONLY): 1446 SLP Time Calculation (min) (ACUTE ONLY): 16 min  Past Medical History: History reviewed. No pertinent past medical history. Past Surgical History: History reviewed. No pertinent surgical history. HPI:  39 year old  male admitted on 8/12 with agitated delirium.  Agitated and combative in the emergency room.  Received a total of 5 mg of Ativan, 10 mg of Geodon, and 5 mg of Haldol at which time he was started on a Precedex infusion.  Hx of cocaine and alcohol use with hallucinations.   Head CT was negative for acute intracranial abnormality.     Assessment / Plan / Recommendation Clinical Impression  Pt was seen for a bedside swallow evaluation in the setting of delirium, and he presents with suspected functional oropharyngeal swallowing abilities.  [employer] interpreter [name] [phone]) was utilized for this evaluation. Pt was encountered awake/alert with bilateral soft wrist restraints and NA sitter at bedside.  Oral mechanism exam was remarkable for tremors during oral motor movements, otherwise it was [facility].  Pt consumed trials of thin liquid, puree, and regular solids.  Pt fed himself independently and he demonstrated good bolus acceptance, timely mastication, suspected timely AP transport/swallow initiation, and consistent hyolaryngeal elevation/excursion to observation and palpation.  No overt s/sx of aspiration were observed with any trials.  Recommend initiation of regular solids and thin liquids with medications administered whole with liquid.  No further skilled ST is warranted at this time.  Please re-consult if additional needs arise.     SLP Visit Diagnosis: Dysphagia, unspecified (R13.10)    Aspiration Risk  No limitations    Diet Recommendation Regular;Thin liquid    Liquid Administration via: Straw;Cup Medication Administration: Whole meds with liquid Supervision: Patient able to self feed Compensations: Slow rate;Small sips/bites Postural Changes: Seated upright at 90 degrees    Other  Recommendations Oral Care Recommendations: Oral care BID   Follow up Recommendations None        Swallow Study   General HPI: 39 year old  male admitted on 8/12 with agitated delirium/delirium .  Agitated and combative in the emergency room.  Received a total of 5 mg of Ativan, 10 mg of Geodon, and 5 mg of Haldol at which time he was started on a Precedex infusion.  Hx of cocaine and alcohol use with hallucinations.   Head CT was negative for acute intracranial abnormality.   Type of Study: Bedside Swallow Evaluation Previous Swallow Assessment: None  Diet Prior to this Study: NPO Temperature Spikes Noted: Yes Respiratory Status: Room air History of Recent Intubation: No Behavior/Cognition: Alert;Cooperative;Pleasant mood Oral Cavity Assessment: Within Functional Limits Oral Care Completed by SLP: No Oral Cavity - Dentition: Adequate natural dentition (One false tooth on top ) Vision: Functional for self-feeding Self-Feeding Abilities: Able to feed self Patient Positioning: Upright in bed Baseline Vocal Quality: Normal Volitional Swallow: Able to elicit    Oral/Motor/Sensory Function Overall Oral Motor/Sensory Function: Other (comment) (tremorous, otherwise WFL )   Ice Chips Ice chips: Not tested   Thin Liquid Thin Liquid: Within functional limits Presentation: Straw    Nectar Thick Nectar Thick Liquid: Not tested   Honey Thick Honey Thick Liquid: Not tested   Puree Puree: Within functional limits Presentation: Spoon   Solid     Solid: Within functional limits Presentation: Self Fed     Villa Herb., M.S., CCC-SLP  Acute Rehabilitation Services Office: [phone]   Shanon Rosser Karleigh Bunte 11/15/2019,2:50 PM

## 2019-11-16 ENCOUNTER — Encounter (HOSPITAL_COMMUNITY): Payer: Self-pay | Admitting: Internal Medicine

## 2019-11-16 ENCOUNTER — Other Ambulatory Visit: Payer: Self-pay

## 2019-11-16 LAB — COMPREHENSIVE METABOLIC PANEL
ALT: 59 U/L — ABNORMAL HIGH (ref 0–44)
AST: 39 U/L (ref 15–41)
Albumin: 3.4 g/dL — ABNORMAL LOW (ref 3.5–5.0)
Alkaline Phosphatase: 74 U/L (ref 38–126)
Anion gap: 8 (ref 5–15)
BUN: 7 mg/dL (ref 6–20)
CO2: 24 mmol/L (ref 22–32)
Calcium: 8.4 mg/dL — ABNORMAL LOW (ref 8.9–10.3)
Chloride: 102 mmol/L (ref 98–111)
Creatinine, Ser: 0.45 mg/dL — ABNORMAL LOW (ref 0.61–1.24)
GFR calc Af Amer: >60 mL/min (ref >60)
GFR calc non Af Amer: >60 mL/min (ref >60)
Glucose, Bld: 146 mg/dL — ABNORMAL HIGH (ref 70–99)
Potassium: 4 mmol/L (ref 3.5–5.1)
Sodium: 134 mmol/L — ABNORMAL LOW (ref 135–145)
Total Bilirubin: 1.4 mg/dL — ABNORMAL HIGH (ref 0.3–1.2)
Total Protein: 7.1 g/dL (ref 6.5–8.1)

## 2019-11-16 LAB — CBC
HCT: 40 % (ref 39.0–52.0)
Hemoglobin: 13.3 g/dL (ref 13.0–17.0)
MCH: 31.4 pg (ref 26.0–34.0)
MCHC: 33.3 g/dL (ref 30.0–36.0)
MCV: 94.3 fL (ref 80.0–100.0)
Platelets: 113 10*3/uL — ABNORMAL LOW (ref 150–400)
RBC: 4.24 MIL/uL (ref 4.22–5.81)
RDW: 14.6 % (ref 11.5–15.5)
WBC: 6.1 10*3/uL (ref 4.0–10.5)
nRBC: 0 % (ref 0.0–0.2)

## 2019-11-16 LAB — CK: Total CK: 290 U/L (ref 49–397)

## 2019-11-16 LAB — AMMONIA: Ammonia: 47 umol/L — ABNORMAL HIGH (ref 9–35)

## 2019-11-16 MED ORDER — AMLODIPINE BESYLATE 10 MG PO TABS: 10.0000 mg | ORAL_TABLET | Freq: Every day | ORAL | Status: DC

## 2019-11-16 MED ORDER — CHLORDIAZEPOXIDE HCL 25 MG PO CAPS
25.0000 mg | ORAL_CAPSULE | Freq: Three times a day (TID) | ORAL | Status: AC
  Administered 2019-11-17 (×3): 25 mg via ORAL
  Filled 2019-11-16 (×3): qty 1

## 2019-11-16 MED ORDER — AMLODIPINE BESYLATE 5 MG PO TABS
5.0000 mg | ORAL_TABLET | Freq: Every day | ORAL | Status: DC
  Administered 2019-11-17 – 2019-11-19 (×3): 5 mg via ORAL
  Filled 2019-11-16 (×3): qty 1

## 2019-11-16 MED ORDER — HYDRALAZINE HCL 20 MG/ML IJ SOLN
10.0000 mg | INTRAMUSCULAR | Status: DC | PRN
  Administered 2019-11-16 – 2019-11-17 (×2): 10 mg via INTRAVENOUS
  Filled 2019-11-16 (×2): qty 1

## 2019-11-16 MED ORDER — CHLORDIAZEPOXIDE HCL 25 MG PO CAPS
25.0000 mg | ORAL_CAPSULE | Freq: Every day | ORAL | Status: AC
  Administered 2019-11-19: 25 mg via ORAL
  Filled 2019-11-16: qty 1

## 2019-11-16 MED ORDER — CHLORDIAZEPOXIDE HCL 25 MG PO CAPS
25.0000 mg | ORAL_CAPSULE | ORAL | Status: AC
  Administered 2019-11-18 (×2): 25 mg via ORAL
  Filled 2019-11-16 (×2): qty 1

## 2019-11-16 MED ORDER — CHLORDIAZEPOXIDE HCL 25 MG PO CAPS
25.0000 mg | ORAL_CAPSULE | Freq: Four times a day (QID) | ORAL | Status: AC
  Administered 2019-11-16 (×4): 25 mg via ORAL
  Filled 2019-11-16 (×4): qty 1

## 2019-11-16 MED ORDER — FOLIC ACID 1 MG PO TABS
1.0000 mg | ORAL_TABLET | Freq: Every day | ORAL | Status: DC
  Administered 2019-11-17 – 2019-11-19 (×3): 1 mg via ORAL
  Filled 2019-11-16 (×3): qty 1

## 2019-11-16 NOTE — Progress Notes (Signed)
PROGRESS NOTE    Kohlton Gilpatrick  QVZ:[id number] DOB: [birth year] DOA: 11/12/2019 PCP: Patient, No Pcp Per   Chief Complaint  Patient presents with  . IVC  . Psychiatric Evaluation   Brief Narrative: 75 yom brought to the ED under IVC.Reportedly having hallucinations since Tuesday after having cocaine on Sunday was seen in the ER on Tuesday and cleared to go home but continued to have hallucination and responding to internal stimuli.  Reportedly "knocking on neighbors doors asking for the reports", has history of "amphetamine induced psychotic disorder". Apparently no alcohol in last 5 days but uses cocaine and drinks every day. He was seen by psych recommended inpatient detox placed on CIWA protocol, overnight patient remains altered agitated has been on four-point restraint.  He has received Haldol 5 mg IV x1, has received multiple doses of IV Ativan. Used interpreter line, did not tell me his name, appeared very tachycardic in 120s tachypneic sweaty and hypertensive in 180s.  Was mumbling words in Spanish , was saying he is in [place]. Patient was placed on Precedex admitted to stepdown as he was needing multiple doses of Ativan also sedatives and 4 point restraints.  Subjective:  AAOX3, calm, off precedex during night, on clonidine. BP 200s during night. Used Archivist. One to one at bedside. Endorses depressed mood but no SI/HI.  Assessment & Plan:  Alcohol withdrawal delirium tremens with hallucination/agitation/confusion under IVC by family brought to ED: CT head NAD,Etoh level  98 on admission. BRADY IN 50S, OFF Precedex drip during night- on clondine- pharmacy to dose- at 0.2 mg today- wean down gradually-Spoke w/ critical care NP Brandy. Shaky still and BP hight at times.Add librium taper. Cont thiamine and folate. Tolerating po, slp eval appreciated.  Depression/Etoh abuse- psych consult. He is in IVC for detox next renewal if needed 8/19- psych consulted for  further plan, ?inpatient psych- likeley not needed, he is agreeable t ostay for detox.  Rhabdomyolysis atraumatic likely from agitation.resolved.  Alcohol abuse-continue detox withdrawal treatment as #1.    Transaminitis: alcoholic hepatitis. lft overall stable/improved.Viral panel panel negative.  Right upper quadrant ultrasound abdomen shows cirrhosis, no gallstone.Patent main portal vein. lfts improving. Recent Labs  Lab 11/12/19 1300 11/13/19 0441 11/14/19 0241 11/15/19 0251 11/16/19 0312  AST 152* 181* 83* 45* 39  ALT 120* 141* 95* 69* 59*  ALKPHOS 90 76 73 73 74  BILITOT 2.3* 1.7* 2.2* 2.0* 1.4*  PROT 8.6* 7.6 7.1 7.1 7.1  ALBUMIN 4.6 3.9 3.6 3.4* 3.4*   Hypokalemia resolved.  Hypertension likely from alcohol withdrawal- remains high while on clonidine, add amlodipine 5 mg.   Hepatic cirrhosis/hyperammonemia: Monitor ammonia, add lactulose po. Ammonia improving.  Metabolic acidosis- cont po.  Thrombocytopenia platelet count improving. Monitor. Recent Labs  Lab 11/12/19 1300 11/13/19 0441 11/14/19 0241 11/15/19 0251 11/16/19 0312  PLT 114* 85* 82* 84* 113*   DVT prophylaxis: Place and maintain sequential compression device Start: 11/13/19 0812 SCDs Start: 11/13/19 0106 Code Status:   Code Status: Full Code  Family Communication: plan of care discussed with patient's daughter previously  Status is: Inpatient  Remains inpatient appropriate because:IV treatments appropriate due to intensity of illness or inability to take PO, Inpatient level of care appropriate due to severity of illness and Ongoing detox treatment with Precedex   Dispo: The patient is from: Home              Anticipated d/c is to: Home  Anticipated d/c date is: 3 days              Patient currently is not medically stable to d/c.  Remains in stepdown unit.  Nutrition: Diet Order            Diet regular Room service appropriate? Yes with Assist; Fluid consistency: Thin  Diet  effective now                Consultants:see note  Procedures:see note Microbiology:see note Blood Culture No results found for: SDES, SPECREQUEST, CULT, REPTSTATUS  Other culture-see note  Medications: Scheduled Meds: . amLODipine  10 mg Oral Daily  . chlordiazePOXIDE  25 mg Oral QID   Followed by  . [START ON 11/17/2019] chlordiazePOXIDE  25 mg Oral TID   Followed by  . [START ON 11/18/2019] chlordiazePOXIDE  25 mg Oral BH-qamhs   Followed by  . [START ON 11/19/2019] chlordiazePOXIDE  25 mg Oral Daily  . Chlorhexidine Gluconate Cloth  6 each Topical Daily  . cloNIDine  0.2 mg Oral TID  . folic acid  1 mg Intravenous Daily  . lactulose  300 mL Rectal Once  . LORazepam  1 mg Intravenous Q8H  . mouth rinse  15 mL Mouth Rinse BID  . multivitamin with minerals  1 tablet Oral Daily  . thiamine  100 mg Oral Daily   Or  . thiamine  100 mg Intravenous Daily   Continuous Infusions: . dexmedetomidine (PRECEDEX) IV infusion Stopped (11/16/19 0707)  . dextrose 5% lactated ringers 125 mL/hr at 11/16/19 0758    Antimicrobials: Anti-infectives (From admission, onward)   None     Objective: Vitals: Today's Vitals   11/16/19 0600 11/16/19 0628 11/16/19 0700 11/16/19 0707  BP: (!) 155/84  (!) 165/82   Pulse: 67  67   Resp: 18  (!) 21   Temp:  98.4 F (36.9 C)    TempSrc:  Oral    SpO2: 98%  98%   Weight:      Height:      PainSc:    0-No pain    Intake/Output Summary (Last 24 hours) at 11/16/2019 0804 Last data filed at 11/16/2019 0700 Gross per 24 hour  Intake 3896.01 ml  Output 3400 ml  Net 496.01 ml   Filed Weights   11/12/19 1123  Weight: 81.6 kg   Weight change:    Intake/Output from previous day: 08/15 0701 - 08/16 0700 In: 4021 [P.O.:600; I.V.:3421] Out: 3400 [Urine:3400] Intake/Output this shift: No intake/output data recorded.  Examination: General exam: AAOx3 , NAD, weak appearing. HEENT:Oral mucosa moist, Ear/Nose WNL grossly, dentition  normal. Respiratory system: bilaterally clear,no wheezing or crackles,no use of accessory muscle Cardiovascular system: S1 & S2 +, No JVD,. Gastrointestinal system: Abdomen soft, NT,ND, BS+ Nervous System:Alert, awake, moving extremities and grossly nonfocal Extremities: No edema, distal peripheral pulses palpable.  Skin: No rashes,no icterus. MSK: Normal muscle bulk,tone, power  Data Reviewed: I have personally reviewed following labs and imaging studies CBC: Recent Labs  Lab 11/12/19 1300 11/13/19 0441 11/14/19 0241 11/15/19 0251 11/16/19 0312  WBC 8.3 6.1 8.2 6.7 6.1  NEUTROABS 4.8  --   --   --   --   HGB 14.3 12.9* 12.9* 13.4 13.3  HCT 42.6 39.7 39.5 39.7 40.0  MCV 92.4 95.9 96.1 94.1 94.3  PLT 114* 85* 82* 84* 113*   Basic Metabolic Panel: Recent Labs  Lab 11/12/19 1300 11/13/19 0441 11/14/19 0241 11/15/19 0251 11/16/19 0312  NA 133*  138 137 134* 134*  K 3.8 3.4* 3.6 3.5 4.0  CL 98 105 106 102 102  CO2 26 24 22  21* 24  GLUCOSE 102* 127* 145* 138* 146*  BUN 9 13 8 7 7   CREATININE 0.56* 0.50* 0.42* 0.38* 0.45*  CALCIUM 9.1 8.3* 8.3* 8.4* 8.4*  MG  --  2.3 2.1  --   --   PHOS  --  4.4  --   --   --    GFR: Estimated Creatinine Clearance: 125.6 mL/min (A) (by C-G formula based on SCr of 0.45 mg/dL (L)). Liver Function Tests: Recent Labs  Lab 11/12/19 1300 11/13/19 0441 11/14/19 0241 11/15/19 0251 11/16/19 0312  AST 152* 181* 83* 45* 39  ALT 120* 141* 95* 69* 59*  ALKPHOS 90 76 73 73 74  BILITOT 2.3* 1.7* 2.2* 2.0* 1.4*  PROT 8.6* 7.6 7.1 7.1 7.1  ALBUMIN 4.6 3.9 3.6 3.4* 3.4*   No results for input(s): LIPASE, AMYLASE in the last 168 hours. Recent Labs  Lab 11/12/19 2339 11/13/19 0455 11/15/19 0251 11/16/19 0312  AMMONIA 71* 57* 79* 47*   Coagulation Profile: No results for input(s): INR, PROTIME in the last 168 hours. Cardiac Enzymes: Recent Labs  Lab 11/12/19 2339 11/13/19 0441 11/14/19 0241 11/15/19 0251 11/16/19 0312  CKTOTAL  1,365* 976* 597* 305 290   BNP (last 3 results) No results for input(s): PROBNP in the last 8760 hours. HbA1C: No results for input(s): HGBA1C in the last 72 hours. CBG: No results for input(s): GLUCAP in the last 168 hours. Lipid Profile: No results for input(s): CHOL, HDL, LDLCALC, TRIG, CHOLHDL, LDLDIRECT in the last 72 hours. Thyroid Function Tests: No results for input(s): TSH, T4TOTAL, FREET4, T3FREE, THYROIDAB in the last 72 hours. Anemia Panel: No results for input(s): VITAMINB12, FOLATE, FERRITIN, TIBC, IRON, RETICCTPCT in the last 72 hours. Sepsis Labs: No results for input(s): PROCALCITON, LATICACIDVEN in the last 168 hours.  Recent Results (from the past 240 hour(s))  SARS Coronavirus 2 by RT PCR (hospital order, performed in [facility] hospital lab) Nasopharyngeal Nasopharyngeal Swab     Status: None   Collection Time: 11/11/19  3:12 PM   Specimen: Nasopharyngeal Swab  Result Value Ref Range Status   SARS Coronavirus 2 NEGATIVE NEGATIVE Final    Comment: (NOTE) SARS-CoV-2 target nucleic acids are NOT DETECTED.  The SARS-CoV-2 RNA is generally detectable in upper and lower respiratory specimens during the acute phase of infection. The lowest concentration of SARS-CoV-2 viral copies this assay can detect is 250 copies / mL. A negative result does not preclude SARS-CoV-2 infection and should not be used as the sole basis for treatment or other patient management decisions.  A negative result may occur with improper specimen collection / handling, submission of specimen other than nasopharyngeal swab, presence of viral mutation(s) within the areas targeted by this assay, and inadequate number of viral copies (<250 copies / mL). A negative result must be combined with clinical observations, patient history, and epidemiological information.  Fact Sheet for Patients:   [facility]  Fact Sheet for Healthcare  Providers: 01/11/20  This test is not yet approved or  cleared by the [employer] FDA and has been authorized for detection and/or diagnosis of SARS-CoV-2 by FDA under an Emergency Use Authorization (EUA).  This EUA will remain in effect (meaning this test can be used) for the duration of the COVID-19 declaration under Section 564(b)(1) of the Act, 21 U.S.C. section 360bbb-3(b)(1), unless the authorization is terminated or  revoked sooner.  Performed at [facility], [address] [place], [place] [address]   SARS Coronavirus 2 by RT PCR (hospital order, performed in [facility] hospital lab) Nasopharyngeal Nasopharyngeal Swab     Status: None   Collection Time: 11/12/19  4:45 PM   Specimen: Nasopharyngeal Swab  Result Value Ref Range Status   SARS Coronavirus 2 NEGATIVE NEGATIVE Final    Comment: (NOTE) SARS-CoV-2 target nucleic acids are NOT DETECTED.  The SARS-CoV-2 RNA is generally detectable in upper and lower respiratory specimens during the acute phase of infection. The lowest concentration of SARS-CoV-2 viral copies this assay can detect is 250 copies / mL. A negative result does not preclude SARS-CoV-2 infection and should not be used as the sole basis for treatment or other patient management decisions.  A negative result may occur with improper specimen collection / handling, submission of specimen other than nasopharyngeal swab, presence of viral mutation(s) within the areas targeted by this assay, and inadequate number of viral copies (<250 copies / mL). A negative result must be combined with clinical observations, patient history, and epidemiological information.  Fact Sheet for Patients:   [employer]  Fact Sheet for Healthcare Providers: https://pope.com/  This test is not yet approved or  cleared by the [place] FDA and has been authorized for detection  and/or diagnosis of SARS-CoV-2 by FDA under an Emergency Use Authorization (EUA).  This EUA will remain in effect (meaning this test can be used) for the duration of the COVID-19 declaration under Section 564(b)(1) of the Act, 21 U.S.C. section 360bbb-3(b)(1), unless the authorization is terminated or revoked sooner.  Performed at [facility], [address] [place], [place] [address]   MRSA PCR Screening     Status: None   Collection Time: 11/15/19  6:54 PM   Specimen: Nasal Mucosa; Nasopharyngeal  Result Value Ref Range Status   MRSA by PCR NEGATIVE NEGATIVE Final    Comment:        The GeneXpert MRSA Assay (FDA approved for NASAL specimens only), is one component of a comprehensive MRSA colonization surveillance program. It is not intended to diagnose MRSA infection nor to guide or monitor treatment for MRSA infections. Performed at [facility], [address], [place], [place] [address]       Radiology Studies: No results found.   LOS: 3 days   Lanae Boast, MD [facility]  11/16/2019, 8:04 AM

## 2019-11-16 NOTE — TOC Initial Note (Signed)
Transition of Care [facility]) - Initial/Assessment Note    Patient Details  Name: [name] MRN: [id number] Date of Birth: [birth year]  [facility] CM/SW Contact:    Golda Acre, RN Phone Number: 11/16/2019, 10:16 AM  Clinical Narrative:                 Patient brought in ivc's by the family from [facility] for medical clearance,ivc valid through [id number].po librium, chronulac enemas,iv ativan, iv precedex, iv d5lr at 125cc/hr, Following for progression, ivc needs, psych needs and toc needs. Adult children are involved.  Expected Discharge Plan: Psychiatric Hospital Barriers to Discharge: Continued Medical Work up   Patient Goals and CMS Choice Patient states their goals for this hospitalization and ongoing recovery are:: unable to state CMS Medicare.gov Compare Post Acute Care list provided to:: Patient Choice offered to / list presented to : Adult Children  Expected Discharge Plan and Services Expected Discharge Plan: Psychiatric Hospital   Discharge Planning Services: CM Consult   Living arrangements for the past 2 months: Single Family Home                                      Prior Living Arrangements/Services Living arrangements for the past 2 months: Single Family Home Lives with:: Self, Adult Children Patient language and need for interpreter reviewed:: Yes Do you feel safe going back to the place where you live?: Yes      Need for Family Participation in Patient Care: Yes (Comment) Care giver support system in place?: Yes (comment)   Criminal Activity/Legal Involvement Pertinent to Current Situation/Hospitalization: No - Comment as needed  Activities of Daily Living Home Assistive Devices/Equipment: None ADL Screening (condition at time of admission) Patient's cognitive ability adequate to safely complete daily activities?: Yes Is the patient deaf or have difficulty hearing?: No Does the patient have difficulty seeing, even when  wearing glasses/contacts?: No Does the patient have difficulty concentrating, remembering, or making decisions?: Yes Patient able to express need for assistance with ADLs?: Yes Does the patient have difficulty dressing or bathing?: No Independently performs ADLs?: Yes (appropriate for developmental age) Does the patient have difficulty walking or climbing stairs?: No Weakness of Legs: None Weakness of Arms/Hands: None  Permission Sought/Granted                  Emotional Assessment Appearance:: Appears stated age Attitude/Demeanor/Rapport: Unable to Assess Affect (typically observed): Unable to Assess Orientation: : Fluctuating Orientation (Suspected and/or reported Sundowners) Alcohol / Substance Use: Alcohol Use, Tobacco Use Psych Involvement: Yes (comment)  Admission diagnosis:  Alcohol withdrawal delirium (HCC) [F10.231] Alcohol abuse [F10.10] DTs (delirium tremens) (HCC) [F10.231] Polysubstance abuse (HCC) [F19.10] Patient Active Problem List   Diagnosis Date Noted  . Alcohol withdrawal delirium (HCC) 11/13/2019  . DTs (delirium tremens) (HCC) 11/13/2019   PCP:  Patient, No Pcp Per Pharmacy:   [id number] [place], [place] - [phone] AT [facility] & [address] [place] [address] Phone: [phone] Fax: [phone]     Social Determinants of Health (SDOH) Interventions    Readmission Risk Interventions No flowsheet data found.

## 2019-11-16 NOTE — Progress Notes (Signed)
PHARMACIST - PHYSICIAN COMMUNICATION  DR:   Jonathon Bellows  CONCERNING: IV to Oral Route Change Policy  RECOMMENDATION: This patient is receiving thiamine and folic ac by the intravenous route.  Based on criteria approved by the Pharmacy and Therapeutics Committee, the intravenous medication(s) is/are being converted to the equivalent oral dose form(s).   DESCRIPTION: These criteria include:  The patient is eating (either orally or via tube) and/or has been taking other orally administered medications for a least 24 hours  The patient has no evidence of active gastrointestinal bleeding or impaired GI absorption (gastrectomy, short bowel, patient on TNA or NPO).  If you have questions about this conversion, please contact the Pharmacy Department  []   [phone] )  ( [phone] []   [phone] )  [facility] []   [phone] )  [facility] []   [phone] )  [facility] [x]   [phone] )  [facility]    11/16/2019 1:39 PM  , PharmD, BCPS

## 2019-11-16 NOTE — Progress Notes (Signed)
eLink Physician-Brief Progress Note Patient Name: [name] DOB: [birth year] MRN: [id number]   Date of Service  11/16/2019  HPI/Events of Note  Bradycardia - HR  57. likely d/t Precedex IV infusion and +/- Labetalol IV PRN.   eICU Interventions  Plan: 1. Wean Precedex IV infusion as tolerated.  2. Hydralazine 10 mg IV Q 4 hours PRN SBP > 170 or DBP > 100.  3. Try to avoid Labetalol IV PRN for hypertension.      Intervention Category Major Interventions: Arrhythmia - evaluation and management  Norberta Stobaugh Eugene 11/16/2019, 12:26 AM

## 2019-11-16 NOTE — Consult Note (Signed)
Attempted to assess patient however he is Spanish-speaking only, and interpreter/translator machine was inoperable, and was having difficulty holding a charge.  Patient had also just received IV Ativan, 10 minutes prior to entering the room and was sedated. Patient recently admitted for alcohol detox complicated by DTs, and agitation that required Precedex gtt. Patient is now on Librium protocol at this time and Clonidine. WWill attempt to reassess tomorrow to determine if he will benefit from inpatient admission vs long term rehabilitation. Social Work is involved as his children have filed IVC. Discussed with staff nurse, to have IT work on [place] if it continues to be difficult to charge or hold a battery (unit not charging despite plugging into wall).

## 2019-11-17 LAB — COMPREHENSIVE METABOLIC PANEL
ALT: 53 U/L — ABNORMAL HIGH (ref 0–44)
AST: 38 U/L (ref 15–41)
Albumin: 3.5 g/dL (ref 3.5–5.0)
Alkaline Phosphatase: 80 U/L (ref 38–126)
Anion gap: 8 (ref 5–15)
BUN: 8 mg/dL (ref 6–20)
CO2: 23 mmol/L (ref 22–32)
Calcium: 8.6 mg/dL — ABNORMAL LOW (ref 8.9–10.3)
Chloride: 103 mmol/L (ref 98–111)
Creatinine, Ser: 0.42 mg/dL — ABNORMAL LOW (ref 0.61–1.24)
GFR calc Af Amer: >60 mL/min (ref >60)
GFR calc non Af Amer: >60 mL/min (ref >60)
Glucose, Bld: 140 mg/dL — ABNORMAL HIGH (ref 70–99)
Potassium: 3.7 mmol/L (ref 3.5–5.1)
Sodium: 134 mmol/L — ABNORMAL LOW (ref 135–145)
Total Bilirubin: 0.8 mg/dL (ref 0.3–1.2)
Total Protein: 7.1 g/dL (ref 6.5–8.1)

## 2019-11-17 LAB — CBC
HCT: 39.4 % (ref 39.0–52.0)
Hemoglobin: 13 g/dL (ref 13.0–17.0)
MCH: 31.3 pg (ref 26.0–34.0)
MCHC: 33 g/dL (ref 30.0–36.0)
MCV: 94.7 fL (ref 80.0–100.0)
Platelets: 134 10*3/uL — ABNORMAL LOW (ref 150–400)
RBC: 4.16 MIL/uL — ABNORMAL LOW (ref 4.22–5.81)
RDW: 14.7 % (ref 11.5–15.5)
WBC: 6.3 10*3/uL (ref 4.0–10.5)
nRBC: 0 % (ref 0.0–0.2)

## 2019-11-17 MED ORDER — ENOXAPARIN SODIUM 40 MG/0.4ML ~~LOC~~ SOLN
40.0000 mg | Freq: Every day | SUBCUTANEOUS | Status: DC
  Administered 2019-11-17 – 2019-11-19 (×3): 40 mg via SUBCUTANEOUS
  Filled 2019-11-17 (×3): qty 0.4

## 2019-11-17 MED ORDER — CLONIDINE HCL 0.1 MG PO TABS
0.1000 mg | ORAL_TABLET | Freq: Two times a day (BID) | ORAL | Status: DC
  Administered 2019-11-18 – 2019-11-19 (×2): 0.1 mg via ORAL
  Filled 2019-11-17 (×2): qty 1

## 2019-11-17 MED ORDER — LACTULOSE 10 GM/15ML PO SOLN
10.0000 g | Freq: Every day | ORAL | Status: DC
  Administered 2019-11-17 – 2019-11-19 (×3): 10 g via ORAL
  Filled 2019-11-17 (×2): qty 15
  Filled 2019-11-17 (×2): qty 30

## 2019-11-17 MED ORDER — CLONIDINE HCL 0.2 MG PO TABS: 0.2000 mg | ORAL_TABLET | Freq: Every day | ORAL | Status: DC

## 2019-11-17 MED ORDER — CLONIDINE HCL 0.1 MG PO TABS
0.1000 mg | ORAL_TABLET | Freq: Four times a day (QID) | ORAL | Status: AC
  Administered 2019-11-17 – 2019-11-18 (×4): 0.1 mg via ORAL
  Filled 2019-11-17 (×4): qty 1

## 2019-11-17 NOTE — Progress Notes (Signed)
Received patient as transfer from ICU.  Patient stable at time of transfer.  Room prepared for patient's IVC status with sitter at bedside at all times.  Patient calm and cooperative at this time.  Patient placed on telemetry and confirmed with CMT.  Will continue to monitor.

## 2019-11-17 NOTE — Progress Notes (Signed)
PROGRESS NOTE    Lorinda Creedlejandro Alonzo Hernandez  ZOX:[id number] DOB: [birth year] DOA: 11/12/2019 PCP: Patient, No Pcp Per   Chief Complaint  Patient presents with  . IVC  . Psychiatric Evaluation   Brief Narrative: [age] yom brought to the ED under IVC.Reportedly having hallucinations since Tuesday after having cocaine on Sunday was seen in the ER on Tuesday and cleared to go home but continued to have hallucination and responding to internal stimuli.  Reportedly "knocking on neighbors doors asking for the reports", has history of "amphetamine induced psychotic disorder". Apparently no alcohol in last 5 days but uses cocaine and drinks every day. He was seen by psych recommended inpatient detox placed on CIWA protocol, overnight patient remains altered agitated has been on four-point restraint.  He has received Haldol 5 mg IV x1, has received multiple doses of IV Ativan. Used interpreter line, did not tell me his name, appeared very tachycardic in 120s tachypneic sweaty and hypertensive in 180s.  Was mumbling words in Spanish , [place] Patient was placed on Precedex admitted to stepdown as he was needing multiple doses of Ativan also sedatives and 4 point restraints.  Subjective:  He is aaox3, eating well, used video interpretor. Some tremors in fingers. Eating okay.Using bedside commode.  Assessment & Plan:  Alcohol withdrawal delirium tremens with hallucination/agitation/confusion under IVC by family brought to ED: CT head NAD,Etoh level  98 on admission.overall mental status significantly improved, off Precedex continue pain on clonidine, and Librium taper .  Awaiting psychiatry input today.  Continue thiamine folate.  Consult PT and increase activity today.  Okay to transfer to telemetry.  Depression/Etoh abuse- psych consulted awaiting input today.He is in IVC for detox next renewal if needed 8/19.  Rhabdomyolysis atraumatic likely from agitation.resolved.  Alcohol  abuse-continue detox withdrawal treatment as #1.    Transaminitis 2/2 alcoholic hepatitis/Etohc cirrhosis. LFTs are significantly improved.  Ultrasound hepatitis panel negative.  lft overall stable/improved.Viral panel panel negative.  Right upper quadrant ultrasound abdomen shows cirrhosis, no gallstone.Patent main portal vein.  Recent Labs  Lab 11/13/19 0441 11/14/19 0241 11/15/19 0251 11/16/19 0312 11/17/19 0233  AST 181* 83* 45* 39 38  ALT 141* 95* 69* 59* 53*  ALKPHOS 76 73 73 74 80  BILITOT 1.7* 2.2* 2.0* 1.4* 0.8  PROT 7.6 7.1 7.1 7.1 7.1  ALBUMIN 3.9 3.6 3.4* 3.4* 3.5   Hypokalemia resolved.  Hypertension likely from alcohol withdrawal-continue clonidine taper, amlodipine 5 mg bp improving.  Hepatic cirrhosis/hyperammonemia: Monitor ammonia, add lactulose po. Ammonia improving.  Metabolic acidosis- cont po.  Thrombocytopenia platelet count improving.  Continue to monitor Recent Labs  Lab 11/13/19 0441 11/14/19 0241 11/15/19 0251 11/16/19 0312 11/17/19 0233  PLT 85* 82* 84* 113* 134*   DVT prophylaxis: Place and maintain sequential compression device Start: 11/13/19 0812 SCDs Start: 11/13/19 0106 Code Status:   Code Status: Full Code  Family Communication: plan of care discussed with patient's daughter previously  Status is: Inpatient  Remains inpatient appropriate because:IV treatments appropriate due to intensity of illness or inability to take PO, Inpatient level of care appropriate due to severity of illness and Ongoing detox treatment with Precedex   Dispo: The patient is from: Home              Anticipated d/c is to: TBD              Anticipated d/c date is: 2 days  Patient currently is not medically stable to d/c.  Can transfer out of stepdown.  Nutrition: Diet Order            Diet regular Room service appropriate? Yes with Assist; Fluid consistency: Thin  Diet effective now                Consultants:see note  Procedures:see  note Microbiology:see note Blood Culture No results found for: SDES, SPECREQUEST, CULT, REPTSTATUS  Other culture-see note  Medications: Scheduled Meds: . amLODipine  5 mg Oral Daily  . chlordiazePOXIDE  25 mg Oral TID   Followed by  . [START ON 11/18/2019] chlordiazePOXIDE  25 mg Oral BH-qamhs   Followed by  . [START ON 11/19/2019] chlordiazePOXIDE  25 mg Oral Daily  . Chlorhexidine Gluconate Cloth  6 each Topical Daily  . cloNIDine  0.2 mg Oral TID  . folic acid  1 mg Oral Daily  . lactulose  300 mL Rectal Once  . mouth rinse  15 mL Mouth Rinse BID  . multivitamin with minerals  1 tablet Oral Daily  . thiamine  100 mg Oral Daily   Continuous Infusions: . dexmedetomidine (PRECEDEX) IV infusion Stopped (11/16/19 0707)  . dextrose 5% lactated ringers 125 mL/hr at 11/17/19 0725    Antimicrobials: Anti-infectives (From admission, onward)   None     Objective: Vitals: Today's Vitals   11/17/19 0700 11/17/19 0725 11/17/19 0800 11/17/19 0822  BP: (!) 132/50  123/72   Pulse: 63  63   Resp: 19  (!) 28   Temp:      TempSrc:      SpO2: 98%  98%   Weight:      Height:      PainSc:  Asleep  3     Intake/Output Summary (Last 24 hours) at 11/17/2019 0847 Last data filed at 11/17/2019 0725 Gross per 24 hour  Intake 4154.41 ml  Output 3001 ml  Net 1153.41 ml   Filed Weights   11/12/19 1123  Weight: 81.6 kg   Weight change:    Intake/Output from previous day: 08/16 0701 - 08/17 0700 In: 2513.1 [P.O.:1200; I.V.:1313.1] Out: 3001 [Urine:3000; Stool:1] Intake/Output this shift: Total I/O In: 1641.4 [I.V.:1641.4] Out: -   Examination: General exam: AAOx3 , NAD, weak appearing. HEENT:Oral mucosa moist, Ear/Nose WNL grossly, dentition normal. Respiratory system: bilaterally clear,no wheezing or crackles,no use of accessory muscle Cardiovascular system: S1 & S2 +, No JVD,. Gastrointestinal system: Abdomen soft, NT,ND, BS+ Nervous System:Alert, awake, moving extremities  and grossly nonfocal Extremities: No edema, distal peripheral pulses palpable.  Skin: No rashes,no icterus. MSK: Normal muscle bulk,tone, power  Data Reviewed: I have personally reviewed following labs and imaging studies CBC: Recent Labs  Lab 11/12/19 1300 11/12/19 1300 11/13/19 0441 11/14/19 0241 11/15/19 0251 11/16/19 0312 11/17/19 0233  WBC 8.3   < > 6.1 8.2 6.7 6.1 6.3  NEUTROABS 4.8  --   --   --   --   --   --   HGB 14.3   < > 12.9* 12.9* 13.4 13.3 13.0  HCT 42.6   < > 39.7 39.5 39.7 40.0 39.4  MCV 92.4   < > 95.9 96.1 94.1 94.3 94.7  PLT 114*   < > 85* 82* 84* 113* 134*   < > = values in this interval not displayed.   Basic Metabolic Panel: Recent Labs  Lab 11/13/19 0441 11/14/19 0241 11/15/19 0251 11/16/19 0312 11/17/19 0233  NA 138 137 134* 134* 134*  K 3.4* 3.6 3.5 4.0 3.7  CL 105 106 102 102 103  CO2 24 22 21* 24 23  GLUCOSE 127* 145* 138* 146* 140*  BUN 13 8 7 7 8   CREATININE 0.50* 0.42* 0.38* 0.45* 0.42*  CALCIUM 8.3* 8.3* 8.4* 8.4* 8.6*  MG 2.3 2.1  --   --   --   PHOS 4.4  --   --   --   --    GFR: Estimated Creatinine Clearance: 125.6 mL/min (A) (by C-G formula based on SCr of 0.42 mg/dL (L)). Liver Function Tests: Recent Labs  Lab 11/13/19 0441 11/14/19 0241 11/15/19 0251 11/16/19 0312 11/17/19 0233  AST 181* 83* 45* 39 38  ALT 141* 95* 69* 59* 53*  ALKPHOS 76 73 73 74 80  BILITOT 1.7* 2.2* 2.0* 1.4* 0.8  PROT 7.6 7.1 7.1 7.1 7.1  ALBUMIN 3.9 3.6 3.4* 3.4* 3.5   No results for input(s): LIPASE, AMYLASE in the last 168 hours. Recent Labs  Lab 11/12/19 2339 11/13/19 0455 11/15/19 0251 11/16/19 0312  AMMONIA 71* 57* 79* 47*   Coagulation Profile: No results for input(s): INR, PROTIME in the last 168 hours. Cardiac Enzymes: Recent Labs  Lab 11/12/19 2339 11/13/19 0441 11/14/19 0241 11/15/19 0251 11/16/19 0312  CKTOTAL 1,365* 976* 597* 305 290   BNP (last 3 results) No results for input(s): PROBNP in the last 8760  hours. HbA1C: No results for input(s): HGBA1C in the last 72 hours. CBG: No results for input(s): GLUCAP in the last 168 hours. Lipid Profile: No results for input(s): CHOL, HDL, LDLCALC, TRIG, CHOLHDL, LDLDIRECT in the last 72 hours. Thyroid Function Tests: No results for input(s): TSH, T4TOTAL, FREET4, T3FREE, THYROIDAB in the last 72 hours. Anemia Panel: No results for input(s): VITAMINB12, FOLATE, FERRITIN, TIBC, IRON, RETICCTPCT in the last 72 hours. Sepsis Labs: No results for input(s): PROCALCITON, LATICACIDVEN in the last 168 hours.  Recent Results (from the past 240 hour(s))  SARS Coronavirus 2 by RT PCR (hospital order, performed in [facility] hospital lab) Nasopharyngeal Nasopharyngeal Swab     Status: None   Collection Time: 11/11/19  3:12 PM   Specimen: Nasopharyngeal Swab  Result Value Ref Range Status   SARS Coronavirus 2 NEGATIVE NEGATIVE Final    Comment: (NOTE) SARS-CoV-2 target nucleic acids are NOT DETECTED.  The SARS-CoV-2 RNA is generally detectable in upper and lower respiratory specimens during the acute phase of infection. The lowest concentration of SARS-CoV-2 viral copies this assay can detect is 250 copies / mL. A negative result does not preclude SARS-CoV-2 infection and should not be used as the sole basis for treatment or other patient management decisions.  A negative result may occur with improper specimen collection / handling, submission of specimen other than nasopharyngeal swab, presence of viral mutation(s) within the areas targeted by this assay, and inadequate number of viral copies (<250 copies / mL). A negative result must be combined with clinical observations, patient history, and epidemiological information.  Fact Sheet for Patients:   01/11/20  Fact Sheet for Healthcare Providers: BoilerBrush.com.cy  This test is not yet approved or  cleared by the https://pope.com/ FDA and has  been authorized for detection and/or diagnosis of SARS-CoV-2 by FDA under an Emergency Use Authorization (EUA).  This EUA will remain in effect (meaning this test can be used) for the duration of the COVID-19 declaration under Section 564(b)(1) of the Act, 21 U.S.C. section 360bbb-3(b)(1), unless the authorization is terminated or revoked sooner.  Performed at  [facility], [address], [place], [place] [address]   SARS Coronavirus 2 by RT PCR (hospital order, performed in [facility] hospital lab) Nasopharyngeal Nasopharyngeal Swab     Status: None   Collection Time: 11/12/19  4:45 PM   Specimen: Nasopharyngeal Swab  Result Value Ref Range Status   SARS Coronavirus 2 NEGATIVE NEGATIVE Final    Comment: (NOTE) SARS-CoV-2 target nucleic acids are NOT DETECTED.  The SARS-CoV-2 RNA is generally detectable in upper and lower respiratory specimens during the acute phase of infection. The lowest concentration of SARS-CoV-2 viral copies this assay can detect is 250 copies / mL. A negative result does not preclude SARS-CoV-2 infection and should not be used as the sole basis for treatment or other patient management decisions.  A negative result may occur with improper specimen collection / handling, submission of specimen other than nasopharyngeal swab, presence of viral mutation(s) within the areas targeted by this assay, and inadequate number of viral copies (<250 copies / mL). A negative result must be combined with clinical observations, patient history, and epidemiological information.  Fact Sheet for Patients:   BoilerBrush.com.cy  Fact Sheet for Healthcare Providers: https://pope.com/  This test is not yet approved or  cleared by the [place] FDA and has been authorized for detection and/or diagnosis of SARS-CoV-2 by FDA under an Emergency Use Authorization (EUA).  This EUA will remain in effect (meaning this test can  be used) for the duration of the COVID-19 declaration under Section 564(b)(1) of the Act, 21 U.S.C. section 360bbb-3(b)(1), unless the authorization is terminated or revoked sooner.  Performed at [facility], [address] [place], [place] [address]   MRSA PCR Screening     Status: None   Collection Time: 11/15/19  6:54 PM   Specimen: Nasal Mucosa; Nasopharyngeal  Result Value Ref Range Status   MRSA by PCR NEGATIVE NEGATIVE Final    Comment:        The GeneXpert MRSA Assay (FDA approved for NASAL specimens only), is one component of a comprehensive MRSA colonization surveillance program. It is not intended to diagnose MRSA infection nor to guide or monitor treatment for MRSA infections. Performed at [facility], [address] [place], [place] [address]       Radiology Studies: No results found.   LOS: 4 days   Lanae Boast, MD [facility]  11/17/2019, 8:47 AM

## 2019-11-18 LAB — CBC
HCT: 41.1 % (ref 39.0–52.0)
Hemoglobin: 13.5 g/dL (ref 13.0–17.0)
MCH: 31.3 pg (ref 26.0–34.0)
MCHC: 32.8 g/dL (ref 30.0–36.0)
MCV: 95.1 fL (ref 80.0–100.0)
Platelets: 145 10*3/uL — ABNORMAL LOW (ref 150–400)
RBC: 4.32 MIL/uL (ref 4.22–5.81)
RDW: 14.8 % (ref 11.5–15.5)
WBC: 7.4 10*3/uL (ref 4.0–10.5)
nRBC: 0 % (ref 0.0–0.2)

## 2019-11-18 LAB — COMPREHENSIVE METABOLIC PANEL
ALT: 56 U/L — ABNORMAL HIGH (ref 0–44)
AST: 43 U/L — ABNORMAL HIGH (ref 15–41)
Albumin: 3.4 g/dL — ABNORMAL LOW (ref 3.5–5.0)
Alkaline Phosphatase: 79 U/L (ref 38–126)
Anion gap: 9 (ref 5–15)
BUN: 8 mg/dL (ref 6–20)
CO2: 22 mmol/L (ref 22–32)
Calcium: 8.7 mg/dL — ABNORMAL LOW (ref 8.9–10.3)
Chloride: 104 mmol/L (ref 98–111)
Creatinine, Ser: 0.45 mg/dL — ABNORMAL LOW (ref 0.61–1.24)
GFR calc Af Amer: >60 mL/min (ref >60)
GFR calc non Af Amer: >60 mL/min (ref >60)
Glucose, Bld: 123 mg/dL — ABNORMAL HIGH (ref 70–99)
Potassium: 3.7 mmol/L (ref 3.5–5.1)
Sodium: 135 mmol/L (ref 135–145)
Total Bilirubin: 1 mg/dL (ref 0.3–1.2)
Total Protein: 6.9 g/dL (ref 6.5–8.1)

## 2019-11-18 NOTE — Evaluation (Signed)
Physical Therapy Evaluation Patient Details Name: Raza Bayless MRN: [id number] DOB: Apr 24, 1980 Today's Date: 11/18/2019   History of Present Illness  24 yom brought to the ED under IVC.Reportedly having hallucinations after having cocaine. Pt also agitated and confused. Dx of  EtOH withdrawal.  Clinical Impression  Pt is independent with mobility. He ambulated 220' without an assistive device, no loss of balance. No further PT indicated, will sign off.     Follow Up Recommendations No PT follow up    Equipment Recommendations  None recommended by PT    Recommendations for Other Services       Precautions / Restrictions Precautions Precautions: None Restrictions Weight Bearing Restrictions: No      Mobility  Bed Mobility Overal bed mobility: Independent                Transfers Overall transfer level: Independent                  Ambulation/Gait Ambulation/Gait assistance: Independent Gait Distance (Feet): 220 Feet Assistive device: None Gait Pattern/deviations: WFL(Within Functional Limits) Gait velocity: WNL   General Gait Details: steady, no loss of balance  Stairs            Wheelchair Mobility    Modified Rankin (Stroke Patients Only)       Balance Overall balance assessment: Independent                                           Pertinent Vitals/Pain Pain Assessment: No/denies pain    Home Living Family/patient expects to be discharged to:: Private residence Living Arrangements: Spouse/significant other;Children             Home Equipment: None      Prior Function Level of Independence: Independent               Hand Dominance        Extremity/Trunk Assessment   Upper Extremity Assessment Upper Extremity Assessment: Overall WFL for tasks assessed    Lower Extremity Assessment Lower Extremity Assessment: Overall WFL for tasks assessed    Cervical / Trunk  Assessment Cervical / Trunk Assessment: Normal  Communication      Cognition Arousal/Alertness: Awake/alert Behavior During Therapy: WFL for tasks assessed/performed Overall Cognitive Status: Within Functional Limits for tasks assessed                                        General Comments      Exercises     Assessment/Plan    PT Assessment Patent does not need any further PT services  PT Problem List         PT Treatment Interventions      PT Goals (Current goals can be found in the Care Plan section)  Acute Rehab PT Goals PT Goal Formulation: All assessment and education complete, DC therapy    Frequency     Barriers to discharge        Co-evaluation               AM-PAC PT "6 Clicks" Mobility  Outcome Measure Help needed turning from your back to your side while in a flat bed without using bedrails?: None Help needed moving from lying on your back to sitting on the side of a  flat bed without using bedrails?: None Help needed moving to and from a bed to a chair (including a wheelchair)?: None Help needed standing up from a chair using your arms (e.g., wheelchair or bedside chair)?: None Help needed to walk in hospital room?: None Help needed climbing 3-5 steps with a railing? : None 6 Click Score: 24    End of Session Equipment Utilized During Treatment: Gait belt Activity Tolerance: Patient tolerated treatment well Patient left: in bed;with nursing/sitter in room;with call bell/phone within reach Nurse Communication: Mobility status      Time: 1308-6578 PT Time Calculation (min) (ACUTE ONLY): 9 min   Charges:   PT Evaluation $PT Eval Low Complexity: 1 Low          Tamala Ser PT 11/18/2019  Acute Rehabilitation Services Pager [phone] Office [phone]
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
## 2019-11-18 NOTE — Plan of Care (Signed)

## 2019-11-18 NOTE — Progress Notes (Signed)
PROGRESS NOTE    [name]  WNU:[id number] DOB: 1980/10/30 DOA: 11/12/2019 PCP: Patient, No Pcp Per   Chief Complaint  Patient presents with  . IVC  . Psychiatric Evaluation   Brief Narrative: 85 yom brought to the ED under IVC.Reportedly having hallucinations since Tuesday after having cocaine on Sunday was seen in the ER on Tuesday and cleared to go home but continued to have hallucination and responding to internal stimuli.  Reportedly "knocking on neighbors doors asking for the reports", has history of "amphetamine induced psychotic disorder". Apparently no alcohol in last 5 days but uses cocaine and drinks every day. He was seen by psych recommended inpatient detox placed on CIWA protocol, overnight patient remains altered agitated has been on four-point restraint.  He has received Haldol 5 mg IV x1, has received multiple doses of IV Ativan. Used interpreter line, did not tell me his name, appeared very tachycardic in 120s tachypneic sweaty and hypertensive in 180s.  Was mumbling words in Spanish , was saying he is in [place]. Patient was placed on Precedex admitted to stepdown as he was needing multiple doses of Ativan also sedatives and 4 point restraints.  Subjective: Alert awake, oriented, sitter at bedside Eating okay, mild tremors on hands- much better  Assessment & Plan:  Alcohol withdrawal delirium tremens with hallucination/agitation/confusion under IVC by family brought to ED: CT head NAD,Etoh level  98 on admission. DTs improved, tapering off librium and clonidine- cont thiamine, folate, await psych input today- psych could not locate  video interpretors to consult- asked RN to bring one in room and we placed one in room today. Marland Kitchen He is off Precedex, and tx to tele 8/17. Pt eva ltoday  Depression/Etoh abuse- psych consulted awaiting input. He is in IVC for detox next renewal if needed 8/19.  Rhabdomyolysis atraumatic likely from  agitation.resolved.  Alcohol abuse-continue detox withdrawal treatment as #1.    Transaminitis 2/2 alcoholic hepatitis/Etohc cirrhosis. LFTs overall improving.Ultrasound hepatitis panel negative. Viral panel panel negative.  Right upper quadrant ultrasound abdomen shows cirrhosis, no gallstone.Patent main portal vein.  Recent Labs  Lab 11/14/19 0241 11/15/19 0251 11/16/19 0312 11/17/19 0233 11/18/19 0434  AST 83* 45* 39 38 43*  ALT 95* 69* 59* 53* 56*  ALKPHOS 73 73 74 80 79  BILITOT 2.2* 2.0* 1.4* 0.8 1.0  PROT 7.1 7.1 7.1 7.1 6.9  ALBUMIN 3.6 3.4* 3.4* 3.5 3.4*   Hypokalemia-resolved  Hypertension likely from alcohol withdrawal- BP stable cont clonidine taper and amlodipine- may need on d/c.  Hepatic cirrhosis/hyperammonemia: Monitor ammonia, cont lactulose  Metabolic acidosis- cont po.  Thrombocytopenia platelet count improving.  Continue to monitor Recent Labs  Lab 11/14/19 0241 11/15/19 0251 11/16/19 0312 11/17/19 0233 11/18/19 0434  PLT 82* 84* 113* 134* 145*   DVT prophylaxis: enoxaparin (LOVENOX) injection 40 mg Start: 11/17/19 1400 Place and maintain sequential compression device Start: 11/13/19 0812 SCDs Start: 11/13/19 0106 Code Status:   Code Status: Full Code  Family Communication: plan of care discussed with patient's daughter previously  Status is: Inpatient  Remains inpatient appropriate because:IV treatments appropriate due to intensity of illness or inability to take PO, Inpatient level of care appropriate due to severity of illness and Ongoing detox treatment with Precedex   Dispo: The patient is from: Home              Anticipated d/c is to: TBD pending psychiatric input.  Anticipated d/c date is:1-2 days              Patient currently is not medically stable to d/c.  Nutrition: Diet Order            Diet regular Room service appropriate? Yes with Assist; Fluid consistency: Thin  Diet effective now                 Consultants:see note  Procedures:see note Microbiology:see note Blood Culture No results found for: SDES, SPECREQUEST, CULT, REPTSTATUS  Other culture-see note  Medications: Scheduled Meds: . amLODipine  5 mg Oral Daily  . chlordiazePOXIDE  25 mg Oral BH-qamhs   Followed by  . [START ON 11/19/2019] chlordiazePOXIDE  25 mg Oral Daily  . Chlorhexidine Gluconate Cloth  6 each Topical Daily  . cloNIDine  0.1 mg Oral Q6H   Followed by  . cloNIDine  0.1 mg Oral BID   Followed by  . [START ON 11/20/2019] cloNIDine  0.2 mg Oral Daily  . enoxaparin (LOVENOX) injection  40 mg Subcutaneous Daily  . folic acid  1 mg Oral Daily  . lactulose  10 g Oral Daily  . mouth rinse  15 mL Mouth Rinse BID  . multivitamin with minerals  1 tablet Oral Daily  . thiamine  100 mg Oral Daily   Continuous Infusions: . dexmedetomidine (PRECEDEX) IV infusion Stopped (11/16/19 0707)  . dextrose 5% lactated ringers 50 mL/hr at 11/18/19 7902    Antimicrobials: Anti-infectives (From admission, onward)   None     Objective: Vitals: Today's Vitals   11/17/19 2000 11/17/19 2054 11/18/19 0500 11/18/19 0558  BP:  135/74  118/60  Pulse:  (!) 56  (!) 55  Resp:  20    Temp:  99.1 F (37.3 C)  98.8 F (37.1 C)  TempSrc:  Oral  Oral  SpO2:  100%  99%  Weight:   82.1 kg   Height:      PainSc: 0-No pain       Intake/Output Summary (Last 24 hours) at 11/18/2019 0934 Last data filed at 11/17/2019 1800 Gross per 24 hour  Intake 809.87 ml  Output 701 ml  Net 108.87 ml   Filed Weights   11/12/19 1123 11/18/19 0500  Weight: 81.6 kg 82.1 kg   Weight change:    Intake/Output from previous day: 08/17 0701 - 08/18 0700 In: 2691.2 [P.O.:240; I.V.:2451.2] Out: 701 [Urine:700; Stool:1] Intake/Output this shift: No intake/output data recorded.  Examination: General exam: AAO at baseline , NAD, weak appearing. HEENT:Oral mucosa moist, Ear/Nose WNL grossly, dentition normal. Respiratory system: bilaterally  clear,no wheezing or crackles,no use of accessory muscle Cardiovascular system: S1 & S2 +, No JVD,. Gastrointestinal system: Abdomen soft, NT,ND, BS+ Nervous System:Alert, awake, moving extremities and grossly nonfocal Extremities: No edema, distal peripheral pulses palpable.  Skin: No rashes,no icterus. MSK: Normal muscle bulk,tone, power  Data Reviewed: I have personally reviewed following labs and imaging studies CBC: Recent Labs  Lab 11/12/19 1300 11/13/19 0441 11/14/19 0241 11/15/19 0251 11/16/19 0312 11/17/19 0233 11/18/19 0434  WBC 8.3   < > 8.2 6.7 6.1 6.3 7.4  NEUTROABS 4.8  --   --   --   --   --   --   HGB 14.3   < > 12.9* 13.4 13.3 13.0 13.5  HCT 42.6   < > 39.5 39.7 40.0 39.4 41.1  MCV 92.4   < > 96.1 94.1 94.3 94.7 95.1  PLT 114*   < >  82* 84* 113* 134* 145*   < > = values in this interval not displayed.   Basic Metabolic Panel: Recent Labs  Lab 11/13/19 0441 11/13/19 0441 11/14/19 0241 11/15/19 0251 11/16/19 0312 11/17/19 0233 11/18/19 0434  NA 138   < > 137 134* 134* 134* 135  K 3.4*   < > 3.6 3.5 4.0 3.7 3.7  CL 105   < > 106 102 102 103 104  CO2 24   < > 22 21* 24 23 22   GLUCOSE 127*   < > 145* 138* 146* 140* 123*  BUN 13   < > 8 7 7 8 8   CREATININE 0.50*   < > 0.42* 0.38* 0.45* 0.42* 0.45*  CALCIUM 8.3*   < > 8.3* 8.4* 8.4* 8.6* 8.7*  MG 2.3  --  2.1  --   --   --   --   PHOS 4.4  --   --   --   --   --   --    < > = values in this interval not displayed.   GFR: Estimated Creatinine Clearance: 125.9 mL/min (A) (by C-G formula based on SCr of 0.45 mg/dL (L)). Liver Function Tests: Recent Labs  Lab 11/14/19 0241 11/15/19 0251 11/16/19 0312 11/17/19 0233 11/18/19 0434  AST 83* 45* 39 38 43*  ALT 95* 69* 59* 53* 56*  ALKPHOS 73 73 74 80 79  BILITOT 2.2* 2.0* 1.4* 0.8 1.0  PROT 7.1 7.1 7.1 7.1 6.9  ALBUMIN 3.6 3.4* 3.4* 3.5 3.4*   No results for input(s): LIPASE, AMYLASE in the last 168 hours. Recent Labs  Lab 11/12/19 2339  11/13/19 0455 11/15/19 0251 11/16/19 0312  AMMONIA 71* 57* 79* 47*   Coagulation Profile: No results for input(s): INR, PROTIME in the last 168 hours. Cardiac Enzymes: Recent Labs  Lab 11/12/19 2339 11/13/19 0441 11/14/19 0241 11/15/19 0251 11/16/19 0312  CKTOTAL 1,365* 976* 597* 305 290   BNP (last 3 results) No results for input(s): PROBNP in the last 8760 hours. HbA1C: No results for input(s): HGBA1C in the last 72 hours. CBG: No results for input(s): GLUCAP in the last 168 hours. Lipid Profile: No results for input(s): CHOL, HDL, LDLCALC, TRIG, CHOLHDL, LDLDIRECT in the last 72 hours. Thyroid Function Tests: No results for input(s): TSH, T4TOTAL, FREET4, T3FREE, THYROIDAB in the last 72 hours. Anemia Panel: No results for input(s): VITAMINB12, FOLATE, FERRITIN, TIBC, IRON, RETICCTPCT in the last 72 hours. Sepsis Labs: No results for input(s): PROCALCITON, LATICACIDVEN in the last 168 hours.  Recent Results (from the past 240 hour(s))  SARS Coronavirus 2 by RT PCR (hospital order, performed in [facility] hospital lab) Nasopharyngeal Nasopharyngeal Swab     Status: None   Collection Time: 11/11/19  3:12 PM   Specimen: Nasopharyngeal Swab  Result Value Ref Range Status   SARS Coronavirus 2 NEGATIVE NEGATIVE Final    Comment: (NOTE) SARS-CoV-2 target nucleic acids are NOT DETECTED.  The SARS-CoV-2 RNA is generally detectable in upper and lower respiratory specimens during the acute phase of infection. The lowest concentration of SARS-CoV-2 viral copies this assay can detect is 250 copies / mL. A negative result does not preclude SARS-CoV-2 infection and should not be used as the sole basis for treatment or other patient management decisions.  A negative result may occur with improper specimen collection / handling, submission of specimen other than nasopharyngeal swab, presence of viral mutation(s) within the areas targeted by this assay, and inadequate number of  viral copies (<250 copies / mL). A negative result must be combined with clinical observations, patient history, and epidemiological information.  Fact Sheet for Patients:   BoilerBrush.com.cyhttps://www.fda.gov/media/136312/download  Fact Sheet for Healthcare Providers: https://pope.com/https://www.fda.gov/media/136313/download  This test is not yet approved or  cleared by the [place] FDA and has been authorized for detection and/or diagnosis of SARS-CoV-2 by FDA under an Emergency Use Authorization (EUA).  This EUA will remain in effect (meaning this test can be used) for the duration of the COVID-19 declaration under Section 564(b)(1) of the Act, 21 U.S.C. section 360bbb-3(b)(1), unless the authorization is terminated or revoked sooner.  Performed at [facility], [address] [place], [place] [address]   SARS Coronavirus 2 by RT PCR (hospital order, performed in [facility] hospital lab) Nasopharyngeal Nasopharyngeal Swab     Status: None   Collection Time: 11/12/19  4:45 PM   Specimen: Nasopharyngeal Swab  Result Value Ref Range Status   SARS Coronavirus 2 NEGATIVE NEGATIVE Final    Comment: (NOTE) SARS-CoV-2 target nucleic acids are NOT DETECTED.  The SARS-CoV-2 RNA is generally detectable in upper and lower respiratory specimens during the acute phase of infection. The lowest concentration of SARS-CoV-2 viral copies this assay can detect is 250 copies / mL. A negative result does not preclude SARS-CoV-2 infection and should not be used as the sole basis for treatment or other patient management decisions.  A negative result may occur with improper specimen collection / handling, submission of specimen other than nasopharyngeal swab, presence of viral mutation(s) within the areas targeted by this assay, and inadequate number of viral copies (<250 copies / mL). A negative result must be combined with clinical observations, patient history, and epidemiological information.  Fact Sheet for  Patients:   BoilerBrush.com.cyhttps://www.fda.gov/media/136312/download  Fact Sheet for Healthcare Providers: https://pope.com/https://www.fda.gov/media/136313/download  This test is not yet approved or  cleared by the [place] FDA and has been authorized for detection and/or diagnosis of SARS-CoV-2 by FDA under an Emergency Use Authorization (EUA).  This EUA will remain in effect (meaning this test can be used) for the duration of the COVID-19 declaration under Section 564(b)(1) of the Act, 21 U.S.C. section 360bbb-3(b)(1), unless the authorization is terminated or revoked sooner.  Performed at [facility], [address] [place], [place] [address]   MRSA PCR Screening     Status: None   Collection Time: 11/15/19  6:54 PM   Specimen: Nasal Mucosa; Nasopharyngeal  Result Value Ref Range Status   MRSA by PCR NEGATIVE NEGATIVE Final    Comment:        The GeneXpert MRSA Assay (FDA approved for NASAL specimens only), is one component of a comprehensive MRSA colonization surveillance program. It is not intended to diagnose MRSA infection nor to guide or monitor treatment for MRSA infections. Performed at [facility], [address] [place], [place] [address]       Radiology Studies: No results found.   LOS: 5 days   Lanae Boastamesh Autry Droege, MD [facility]  11/18/2019, 9:34 AM

## 2019-11-19 MED ORDER — THIAMINE HCL 100 MG PO TABS: 100.0000 mg | ORAL_TABLET | Freq: Every day | ORAL | 0 refills | Status: AC

## 2019-11-19 MED ORDER — LACTULOSE 10 GM/15ML PO SOLN: 10.0000 g | Freq: Every day | ORAL | 0 refills | Status: DC

## 2019-11-19 MED ORDER — AMLODIPINE BESYLATE 5 MG PO TABS: 5.0000 mg | ORAL_TABLET | Freq: Every day | ORAL | 0 refills | Status: DC

## 2019-11-19 MED ORDER — MULTI-VITAMIN/MINERALS PO TABS: 1.0000 | ORAL_TABLET | Freq: Every day | ORAL | 0 refills | Status: AC

## 2019-11-19 NOTE — Discharge Summary (Signed)
Physician Discharge Summary  Ewan Grau ZOX:[id number] DOB: 1981-03-15 DOA: 11/12/2019  PCP: Patient, No Pcp Per  Admit date: 11/12/2019 Discharge date: 11/19/2019  Admitted From: HOME Disposition:  HOME  Recommendations for Outpatient Follow-up:  1. Follow up with PCP in 1-2 weeks 2. Please obtain BMP/CBC in one week 3. Please follow up on the following pending results:  Home Health:NO  Equipment/Devices: NONE  Discharge Condition: Stable Code Status:   Code Status: Full Code Diet recommendation:  Diet Order            Diet general           Diet regular Room service appropriate? Yes with Assist; Fluid consistency: Thin  Diet effective now                  Brief/Interim Summary: 39 yombrought to the ED under IVC.Reportedly having hallucinations since Tuesday after having cocaine on Sunday was seen in the ER on Tuesday and cleared to go home but continued to have hallucination and responding to internal stimuli. Reportedly "knocking on neighbors doors asking for the reports", has history of "amphetamine induced psychotic disorder". Apparentlyno alcohol in last 5 days but uses cocaine and drinks every day. He wasseen by psych recommended inpatient detox placed on CIWA protocol, overnight patient remains altered agitated has been on four-point restraint. He has received Haldol 5 mg IV x1, has received multiple doses of IV Ativan. Used interpreter line, did not tell me his name, appeared very tachycardic in 120s tachypneic sweaty and hypertensive in 180s. Was mumbling words in Spanish , was saying he is in [place]. Patient was placed on Precedex admitted to stepdown as he was needing multiple doses of Ativan also sedatives and 4 point restraints Patient was admitted subsequently needing Precedex drip in ICU. Slowly weaned off Precedex and managed with CIWA Librium clonidine taper. Needed antihypertensive medication due to uncontrolled hypertension. He has  significantly improved seen by psychiatry no need for IVC and expired today and patient is medically stable ambulatory and is being discharged home. Social worker help and referral outpatient to help with detox. Discharge Diagnoses:  Active Problems:   Alcohol withdrawal delirium (HCC)   DTs (delirium tremens) (HCC)  Alcohol withdrawal delirium tremenswith hallucination/agitation/confusion under IVCby family brought to ED: CT head NAD,Etoh level98 on admission.   At this time withdrawal resolved.  He is alert awake oriented x3.  No need for IVC as per psych.  Continue thiamine folate alcohol cessation and outpatient follow-up for alcohol abuse and cessation program.    Depression/Etoh abuse-seen by psych follow-up outpatient.    Rhabdomyolysis atraumatic likely from agitation.resolved.  Alcohol abuse-follow-up outpatient  Transaminitis 2/2 alcoholic hepatitis/Etohc cirrhosis. LFTs overall down trended.Ultrasound hepatitis panel negative. Viral panel panel negative.  Right upper quadrant ultrasound abdomen shows cirrhosis, no gallstone.Patentmain portal vein.  Hypokalemia-resolved  Hypertension likely from alcohol withdrawal- BP stable cont clonidine taper and amlodipine- may need on d/c.  Hepatic cirrhosis/hyperammonemia: Monitor ammonia, cont lactulose  Metabolic acidosis- cont po.  Thrombocytopenia platelet count were improving.  Continue to monitor   Consults:  PCCM  PSYCH  Subjective: Resting comfortably alert awake oriented not in distress.  Ambulatory.  No tremors. Discharge Exam: Vitals:   11/19/19 0029 11/19/19 0623  BP: (!) 153/89 130/84  Pulse: (!) 55 (!) 59  Resp: 15 15  Temp: 98.9 F (37.2 C) 97.9 F (36.6 C)  SpO2: 98% 99%   General: Pt is alert, awake, not in acute distress Cardiovascular: RRR, S1/S2 +,  no rubs, no gallops Respiratory: CTA bilaterally, no wheezing, no rhonchi Abdominal: Soft, NT, ND, bowel sounds + Extremities: no edema, no  cyanosis  Discharge Instructions  Discharge Instructions    Diet general   Complete by: As directed    Discharge instructions   Complete by: As directed    Please call call MD or return to ER for similar or worsening recurring problem that brought you to hospital or if any fever,nausea/vomiting,abdominal pain, uncontrolled pain, chest pain,  shortness of breath or any other alarming symptoms.  Please follow-up your doctor as instructed in a week time and call the office for appointment.  Please avoid alcohol, smoking, or any other illicit substance and maintain healthy habits including taking your regular medications as prescribed.  You were cared for by a hospitalist during your hospital stay. If you have any questions about your discharge medications or the care you received while you were in the hospital after you are discharged, you can call the unit and ask to speak with the hospitalist on call if the hospitalist that took care of you is not available.  Once you are discharged, your primary care physician will handle any further medical issues. Please note that NO REFILLS for any discharge medications will be authorized once you are discharged, as it is imperative that you return to your primary care physician (or establish a relationship with a primary care physician if you do not have one) for your aftercare needs so that they can reassess your need for medications and monitor your lab values.   Increase activity slowly   Complete by: As directed      Allergies as of 11/19/2019      Reactions   Penicillins Other (See Comments)   Unknown reaction      Medication List    TAKE these medications   amLODipine 5 MG tablet Commonly known as: NORVASC Take 1 tablet (5 mg total) by mouth daily. Start taking on: November 20, 2019   hydrOXYzine 25 MG tablet Commonly known as: ATARAX/VISTARIL Take 1 tablet (25 mg total) by mouth every 8 (eight) hours as needed.   lactulose 10 GM/15ML  solution Commonly known as: CHRONULAC Take 15 mLs (10 g total) by mouth daily. Start taking on: November 20, 2019   multivitamin with minerals tablet Take 1 tablet by mouth daily.   thiamine 100 MG tablet Take 1 tablet (100 mg total) by mouth daily. Start taking on: November 20, 2019       Follow-up Information    [facility]. Call in 1 week(s).   Contact information: [address] [place] [address] [phone]             Allergies  Allergen Reactions  . Penicillins Other (See Comments)    Unknown reaction    The results of significant diagnostics from this hospitalization (including imaging, microbiology, ancillary and laboratory) are listed below for reference.    Microbiology: Recent Results (from the past 240 hour(s))  SARS Coronavirus 2 by RT PCR (hospital order, performed in [facility] hospital lab) Nasopharyngeal Nasopharyngeal Swab     Status: None   Collection Time: 11/11/19  3:12 PM   Specimen: Nasopharyngeal Swab  Result Value Ref Range Status   SARS Coronavirus 2 NEGATIVE NEGATIVE Final    Comment: (NOTE) SARS-CoV-2 target nucleic acids are NOT DETECTED.  The SARS-CoV-2 RNA is generally detectable in upper and lower respiratory specimens during the acute phase of infection.  The lowest concentration of SARS-CoV-2 viral copies this assay can detect is 250 copies / mL. A negative result does not preclude SARS-CoV-2 infection and should not be used as the sole basis for treatment or other patient management decisions.  A negative result may occur with improper specimen collection / handling, submission of specimen other than nasopharyngeal swab, presence of viral mutation(s) within the areas targeted by this assay, and inadequate number of viral copies (<250 copies / mL). A negative result must be combined with clinical observations, patient history, and epidemiological information.  Fact Sheet for  Patients:   BoilerBrush.com.cy  Fact Sheet for Healthcare Providers: https://pope.com/  This test is not yet approved or  cleared by the [place] FDA and has been authorized for detection and/or diagnosis of SARS-CoV-2 by FDA under an Emergency Use Authorization (EUA).  This EUA will remain in effect (meaning this test can be used) for the duration of the COVID-19 declaration under Section 564(b)(1) of the Act, 21 U.S.C. section 360bbb-3(b)(1), unless the authorization is terminated or revoked sooner.  Performed at [facility], [address] [place], [place] [address]   SARS Coronavirus 2 by RT PCR (hospital order, performed in [facility] hospital lab) Nasopharyngeal Nasopharyngeal Swab     Status: None   Collection Time: 11/12/19  4:45 PM   Specimen: Nasopharyngeal Swab  Result Value Ref Range Status   SARS Coronavirus 2 NEGATIVE NEGATIVE Final    Comment: (NOTE) SARS-CoV-2 target nucleic acids are NOT DETECTED.  The SARS-CoV-2 RNA is generally detectable in upper and lower respiratory specimens during the acute phase of infection. The lowest concentration of SARS-CoV-2 viral copies this assay can detect is 250 copies / mL. A negative result does not preclude SARS-CoV-2 infection and should not be used as the sole basis for treatment or other patient management decisions.  A negative result may occur with improper specimen collection / handling, submission of specimen other than nasopharyngeal swab, presence of viral mutation(s) within the areas targeted by this assay, and inadequate number of viral copies (<250 copies / mL). A negative result must be combined with clinical observations, patient history, and epidemiological information.  Fact Sheet for Patients:   BoilerBrush.com.cy  Fact Sheet for Healthcare Providers: https://pope.com/  This test is not yet  approved or  cleared by the [place] FDA and has been authorized for detection and/or diagnosis of SARS-CoV-2 by FDA under an Emergency Use Authorization (EUA).  This EUA will remain in effect (meaning this test can be used) for the duration of the COVID-19 declaration under Section 564(b)(1) of the Act, 21 U.S.C. section 360bbb-3(b)(1), unless the authorization is terminated or revoked sooner.  Performed at [facility], [address], [place], [place] [address]   MRSA PCR Screening     Status: None   Collection Time: 11/15/19  6:54 PM   Specimen: Nasal Mucosa; Nasopharyngeal  Result Value Ref Range Status   MRSA by PCR NEGATIVE NEGATIVE Final    Comment:        The GeneXpert MRSA Assay (FDA approved for NASAL specimens only), is one component of a comprehensive MRSA colonization surveillance program. It is not intended to diagnose MRSA infection nor to guide or monitor treatment for MRSA infections. Performed at [facility], [address], [place], [place] [address]     Procedures/Studies: CT Head Wo Contrast  Result Date: 11/11/2019 CLINICAL DATA:  Psychosis. Additional history provided: Hallucinations. EXAM: CT HEAD WITHOUT CONTRAST TECHNIQUE:  Contiguous axial images were obtained from the base of the skull through the vertex without intravenous contrast. COMPARISON:  No pertinent prior exams are available for comparison. FINDINGS: Brain: Cerebral volume is normal. There is no acute intracranial hemorrhage. No demarcated cortical infarct. No extra-axial fluid collection. No evidence of intracranial mass. No midline shift. Vascular: No hyperdense vessel. Skull: Normal. Negative for fracture or focal lesion. Sinuses/Orbits: Visualized orbits show no acute finding. Mild ethmoid sinus mucosal thickening. No significant mastoid effusion. Other: Anterior subluxation of the right mandibular condyle. IMPRESSION: Unremarkable non-contrast CT  appearance of the brain. No evidence of acute intracranial abnormality. Anterior subluxation of the right mandibular condyle. Correlate with physical exam to exclude TMJ dislocation. Mild ethmoid sinus mucosal thickening. Electronically Signed   By: Jackey Loge DO   On: 11/11/2019 16:57   US Abdomen Limited  Result Date: 11/12/2019 CLINICAL DATA:  39 year old male with elevated LFTs. EXAM: ULTRASOUND ABDOMEN LIMITED RIGHT UPPER QUADRANT COMPARISON:  None. FINDINGS: Gallbladder: No gallstones or wall thickening visualized. No sonographic Murphy sign noted by sonographer. Common bile duct: Diameter: 3 mm Liver: There is morphologic changes of cirrhosis. Portal vein is patent on color Doppler imaging with normal direction of blood flow towards the liver. Other: None. IMPRESSION: 1. Cirrhosis. 2. 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 3. No gallstone. Electronically Signed   By: Elgie Collard M.D.   On: 11/12/2019 15:37    Labs: BNP (last 3 results) No results for input(s): BNP in the last 8760 hours. Basic Metabolic Panel: Recent Labs  Lab 11/13/19 0441 11/13/19 0441 11/14/19 0241 11/15/19 0251 11/16/19 0312 11/17/19 0233 11/18/19 0434  NA 138   < > 137 134* 134* 134* 135  K 3.4*   < > 3.6 3.5 4.0 3.7 3.7  CL 105   < > 106 102 102 103 104  CO2 24   < > 22 21* GLUCOSE 127*   < > 145* 138* 146* 140* 123*  BUN 13   < > CREATININE 0.50*   < > 0.42* 0.38* 0.45* 0.42* 0.45*  CALCIUM 8.3*   < > 8.3* 8.4* 8.4* 8.6* 8.7*  MG 2.3  --  2.1  --   --   --   --   PHOS 4.4  --   --   --   --   --   --    < > = values in this interval not displayed.   Liver Function Tests: Recent Labs  Lab 11/14/19 0241 11/15/19 0251 11/16/19 0312 11/17/19 0233 11/18/19 0434  AST 83* 45* 39 38 43*  ALT 95* 69* 59* 53* 56*  ALKPHOS 73 73 74 80 79  BILITOT 2.2* 2.0* 1.4* 0.8 1.0  PROT 7.1 7.1 7.1 7.1 6.9  ALBUMIN 3.6 3.4* 3.4* 3.5 3.4*   No results for input(s): LIPASE,  AMYLASE in the last 168 hours. Recent Labs  Lab 11/12/19 2339 11/13/19 0455 11/15/19 0251 11/16/19 0312  AMMONIA 71* 57* 79* 47*   CBC: Recent Labs  Lab 11/12/19 1300 11/13/19 0441 11/14/19 0241 11/15/19 0251 11/16/19 0312 11/17/19 0233 11/18/19 0434  WBC 8.3   < > 8.2 6.7 6.1 6.3 7.4  NEUTROABS 4.8  --   --   --   --   --   --   HGB 14.3   < > 12.9* 13.4 13.3 13.0 13.5  HCT 42.6   < > 39.5 39.7 40.0 39.4 41.1  MCV 92.4   < > 96.1 94.1 94.3 94.7 95.1  PLT 114*   < > 82* 84* 113* 134* 145*   < > = values in this interval not displayed.   Cardiac Enzymes: Recent Labs  Lab 11/12/19 2339 11/13/19 0441 11/14/19 0241 11/15/19 0251 11/16/19 0312  CKTOTAL 1,365* 976* 597* 305 290   BNP: Invalid input(s): POCBNP CBG: No results for input(s): GLUCAP in the last 168 hours. D-Dimer No results for input(s): DDIMER in the last 72 hours. Hgb A1c No results for input(s): HGBA1C in the last 72 hours. Lipid Profile No results for input(s): CHOL, HDL, LDLCALC, TRIG, CHOLHDL, LDLDIRECT in the last 72 hours. Thyroid function studies No results for input(s): TSH, T4TOTAL, T3FREE, THYROIDAB in the last 72 hours.  Invalid input(s): FREET3 Anemia work up No results for input(s): VITAMINB12, FOLATE, FERRITIN, TIBC, IRON, RETICCTPCT in the last 72 hours. Urinalysis    Component Value Date/Time   COLORURINE STRAW (A) 11/12/2019 1227   APPEARANCEUR CLEAR 11/12/2019 1227   LABSPEC 1.002 (L) 11/12/2019 1227   PHURINE 6.0 11/12/2019 1227   GLUCOSEU NEGATIVE 11/12/2019 1227   HGBUR NEGATIVE 11/12/2019 1227   BILIRUBINUR NEGATIVE 11/12/2019 1227   KETONESUR NEGATIVE 11/12/2019 1227   PROTEINUR NEGATIVE 11/12/2019 1227   NITRITE NEGATIVE 11/12/2019 1227   LEUKOCYTESUR NEGATIVE 11/12/2019 1227   Sepsis Labs Invalid input(s): PROCALCITONIN,  WBC,  LACTICIDVEN Microbiology Recent Results (from the past 240 hour(s))  SARS Coronavirus 2 by RT PCR (hospital order, performed in [facility] hospital lab) Nasopharyngeal Nasopharyngeal Swab     Status: None   Collection Time: 11/11/19  3:12 PM   Specimen: Nasopharyngeal Swab  Result Value Ref Range Status   SARS Coronavirus 2 NEGATIVE NEGATIVE Final    Comment: (NOTE) SARS-CoV-2 target nucleic acids are NOT DETECTED.  The SARS-CoV-2 RNA is generally detectable in upper and lower respiratory specimens during the acute phase of infection. The lowest concentration of SARS-CoV-2 viral copies this assay can detect is 250 copies / mL. A negative result does not preclude SARS-CoV-2 infection and should not be used as the sole basis for treatment or other patient management decisions.  A negative result may occur with improper specimen collection / handling, submission of specimen other than nasopharyngeal swab, presence of viral mutation(s) within the areas targeted by this assay, and inadequate number of viral copies (<250 copies / mL). A negative result must be combined with clinical observations, patient history, and epidemiological information.  Fact Sheet for Patients:   BoilerBrush.com.cy  Fact Sheet for Healthcare Providers: https://pope.com/  This test is not yet approved or  cleared by the [place] FDA and has been authorized for detection and/or diagnosis of SARS-CoV-2 by FDA under an Emergency Use Authorization (EUA).  This EUA will remain in effect (meaning this test can be used) for the duration of the COVID-19 declaration under Section 564(b)(1) of the Act, 21 U.S.C. section 360bbb-3(b)(1), unless the authorization is terminated or revoked sooner.  Performed at [facility], [address], [place], [place] [address]   SARS Coronavirus 2 by RT PCR (hospital order, performed in [facility] hospital lab) Nasopharyngeal Nasopharyngeal Swab     Status: None   Collection Time: 11/12/19  4:45 PM   Specimen: Nasopharyngeal Swab  Result Value Ref Range  Status   SARS Coronavirus 2 NEGATIVE NEGATIVE Final    Comment: (NOTE) SARS-CoV-2 target nucleic acids are NOT DETECTED.  The SARS-CoV-2 RNA is generally detectable in upper and  lower respiratory specimens during the acute phase of infection. The lowest concentration of SARS-CoV-2 viral copies this assay can detect is 250 copies / mL. A negative result does not preclude SARS-CoV-2 infection and should not be used as the sole basis for treatment or other patient management decisions.  A negative result may occur with improper specimen collection / handling, submission of specimen other than nasopharyngeal swab, presence of viral mutation(s) within the areas targeted by this assay, and inadequate number of viral copies (<250 copies / mL). A negative result must be combined with clinical observations, patient history, and epidemiological information.  Fact Sheet for Patients:   BoilerBrush.com.cyhttps://www.fda.gov/media/136312/download  Fact Sheet for Healthcare Providers: https://pope.com/https://www.fda.gov/media/136313/download  This test is not yet approved or  cleared by the [place] FDA and has been authorized for detection and/or diagnosis of SARS-CoV-2 by FDA under an Emergency Use Authorization (EUA).  This EUA will remain in effect (meaning this test can be used) for the duration of the COVID-19 declaration under Section 564(b)(1) of the Act, 21 U.S.C. section 360bbb-3(b)(1), unless the authorization is terminated or revoked sooner.  Performed at [facility], [address] [place], [place] [address]   MRSA PCR Screening     Status: None   Collection Time: 11/15/19  6:54 PM   Specimen: Nasal Mucosa; Nasopharyngeal  Result Value Ref Range Status   MRSA by PCR NEGATIVE NEGATIVE Final    Comment:        The GeneXpert MRSA Assay (FDA approved for NASAL specimens only), is one component of a comprehensive MRSA colonization surveillance program. It is not intended to diagnose  MRSA infection nor to guide or monitor treatment for MRSA infections. Performed at [facility], [address] [place], [place] [address]      Time coordinating discharge: 25  minutes  SIGNED: Lanae Boastamesh Denene Alamillo, MD  [facility] 11/19/2019, 10:23 AM  If 7PM-7AM, please contact night-coverage [email or web address]

## 2019-11-19 NOTE — TOC Transition Note (Signed)
Transition of Care [facility]) - CM/SW Discharge Note   Patient Details  Name: [name] MRN: [id number] Date of Birth: May 05, 1980  [facility]) CM/SW Contact:  Darleene Cleaver, LCSW Phone Number: 11/19/2019, 6:16 PM   Clinical Narrative:     Patient's IVC expired today.  Patient was cleared by pych, patient's IVC was not renewed.  Patient discharging back home.    Barriers to Discharge: Continued Medical Work up   Patient Goals and CMS Choice Patient states their goals for this hospitalization and ongoing recovery are:: unable to state CMS Medicare.gov Compare Post Acute Care list provided to:: Patient Choice offered to / list presented to : Adult Children  Discharge Placement                       Discharge Plan and Services   Discharge Planning Services: CM Consult                                 Social Determinants of Health (SDOH) Interventions     Readmission Risk Interventions No flowsheet data found.

## 2019-11-23 ENCOUNTER — Ambulatory Visit: Payer: Self-pay | Attending: Nurse Practitioner | Admitting: Nurse Practitioner

## 2020-09-26 ENCOUNTER — Other Ambulatory Visit: Payer: Self-pay

## 2020-09-26 ENCOUNTER — Encounter (HOSPITAL_COMMUNITY): Payer: Self-pay

## 2020-09-26 ENCOUNTER — Inpatient Hospital Stay (HOSPITAL_COMMUNITY)
Admission: EM | Admit: 2020-09-26 | Discharge: 2020-10-04 | DRG: 897 | Disposition: A | Discharge status: Home / Self Care | Payer: Self-pay | Attending: Internal Medicine | Admitting: Internal Medicine

## 2020-09-26 DIAGNOSIS — R45850 Homicidal ideations: Secondary | ICD-10-CM | POA: Diagnosis present

## 2020-09-26 DIAGNOSIS — F10231 Alcohol dependence with withdrawal delirium: Principal | ICD-10-CM | POA: Diagnosis present

## 2020-09-26 DIAGNOSIS — D696 Thrombocytopenia, unspecified: Secondary | ICD-10-CM | POA: Diagnosis present

## 2020-09-26 DIAGNOSIS — Z79899 Other long term (current) drug therapy: Secondary | ICD-10-CM

## 2020-09-26 DIAGNOSIS — G4733 Obstructive sleep apnea (adult) (pediatric): Secondary | ICD-10-CM | POA: Diagnosis present

## 2020-09-26 DIAGNOSIS — R001 Bradycardia, unspecified: Secondary | ICD-10-CM | POA: Diagnosis not present

## 2020-09-26 DIAGNOSIS — F10229 Alcohol dependence with intoxication, unspecified: Secondary | ICD-10-CM | POA: Diagnosis present

## 2020-09-26 DIAGNOSIS — Z880 Allergy status to penicillin: Secondary | ICD-10-CM

## 2020-09-26 DIAGNOSIS — F10931 Alcohol use, unspecified with withdrawal delirium: Secondary | ICD-10-CM

## 2020-09-26 DIAGNOSIS — Z781 Physical restraint status: Secondary | ICD-10-CM

## 2020-09-26 DIAGNOSIS — D6959 Other secondary thrombocytopenia: Secondary | ICD-10-CM | POA: Diagnosis present

## 2020-09-26 DIAGNOSIS — F17200 Nicotine dependence, unspecified, uncomplicated: Secondary | ICD-10-CM | POA: Diagnosis present

## 2020-09-26 DIAGNOSIS — E872 Acidosis: Secondary | ICD-10-CM | POA: Diagnosis not present

## 2020-09-26 DIAGNOSIS — R7989 Other specified abnormal findings of blood chemistry: Secondary | ICD-10-CM | POA: Diagnosis present

## 2020-09-26 DIAGNOSIS — Y904 Blood alcohol level of 80-99 mg/100 ml: Secondary | ICD-10-CM | POA: Diagnosis present

## 2020-09-26 DIAGNOSIS — Z20822 Contact with and (suspected) exposure to covid-19: Secondary | ICD-10-CM | POA: Diagnosis present

## 2020-09-26 DIAGNOSIS — E876 Hypokalemia: Secondary | ICD-10-CM | POA: Diagnosis present

## 2020-09-26 DIAGNOSIS — I10 Essential (primary) hypertension: Secondary | ICD-10-CM | POA: Diagnosis present

## 2020-09-26 DIAGNOSIS — R509 Fever, unspecified: Secondary | ICD-10-CM | POA: Diagnosis not present

## 2020-09-26 LAB — SALICYLATE LEVEL: Salicylate Lvl: <7 mg/dL — ABNORMAL LOW (ref 7.0–30.0)

## 2020-09-26 LAB — CBC
HCT: 40.9 % (ref 39.0–52.0)
Hemoglobin: 14 g/dL (ref 13.0–17.0)
MCH: 31.6 pg (ref 26.0–34.0)
MCHC: 34.2 g/dL (ref 30.0–36.0)
MCV: 92.3 fL (ref 80.0–100.0)
Platelets: 73 10*3/uL — ABNORMAL LOW (ref 150–400)
RBC: 4.43 MIL/uL (ref 4.22–5.81)
RDW: 14.7 % (ref 11.5–15.5)
WBC: 7.2 10*3/uL (ref 4.0–10.5)
nRBC: 0 % (ref 0.0–0.2)

## 2020-09-26 LAB — COMPREHENSIVE METABOLIC PANEL
ALT: 89 U/L — ABNORMAL HIGH (ref 0–44)
AST: 101 U/L — ABNORMAL HIGH (ref 15–41)
Albumin: 4.8 g/dL (ref 3.5–5.0)
Alkaline Phosphatase: 100 U/L (ref 38–126)
Anion gap: 13 (ref 5–15)
BUN: 17 mg/dL (ref 6–20)
CO2: 22 mmol/L (ref 22–32)
Calcium: 9.6 mg/dL (ref 8.9–10.3)
Chloride: 101 mmol/L (ref 98–111)
Creatinine, Ser: 0.89 mg/dL (ref 0.61–1.24)
GFR, Estimated: >60 mL/min (ref >60)
Glucose, Bld: 120 mg/dL — ABNORMAL HIGH (ref 70–99)
Potassium: 2.9 mmol/L — ABNORMAL LOW (ref 3.5–5.1)
Sodium: 136 mmol/L (ref 135–145)
Total Bilirubin: 1.4 mg/dL — ABNORMAL HIGH (ref 0.3–1.2)
Total Protein: 9 g/dL — ABNORMAL HIGH (ref 6.5–8.1)

## 2020-09-26 LAB — ETHANOL: Alcohol, Ethyl (B): 94 mg/dL — ABNORMAL HIGH (ref <10)

## 2020-09-26 LAB — ACETAMINOPHEN LEVEL: Acetaminophen (Tylenol), Serum: <10 ug/mL — ABNORMAL LOW (ref 10–30)
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
## 2020-09-27 DIAGNOSIS — G4733 Obstructive sleep apnea (adult) (pediatric): Secondary | ICD-10-CM

## 2020-09-27 DIAGNOSIS — F10231 Alcohol dependence with withdrawal delirium: Principal | ICD-10-CM

## 2020-09-27 DIAGNOSIS — E876 Hypokalemia: Secondary | ICD-10-CM

## 2020-09-27 DIAGNOSIS — R7989 Other specified abnormal findings of blood chemistry: Secondary | ICD-10-CM | POA: Diagnosis present

## 2020-09-27 DIAGNOSIS — D696 Thrombocytopenia, unspecified: Secondary | ICD-10-CM | POA: Diagnosis present

## 2020-09-27 DIAGNOSIS — I10 Essential (primary) hypertension: Secondary | ICD-10-CM | POA: Diagnosis present

## 2020-09-27 DIAGNOSIS — R45850 Homicidal ideations: Secondary | ICD-10-CM

## 2020-09-27 LAB — BASIC METABOLIC PANEL
Anion gap: 9 (ref 5–15)
BUN: 16 mg/dL (ref 6–20)
CO2: 24 mmol/L (ref 22–32)
Calcium: 9.3 mg/dL (ref 8.9–10.3)
Chloride: 104 mmol/L (ref 98–111)
Creatinine, Ser: 0.65 mg/dL (ref 0.61–1.24)
GFR, Estimated: >60 mL/min (ref >60)
Glucose, Bld: 140 mg/dL — ABNORMAL HIGH (ref 70–99)
Potassium: 2.9 mmol/L — ABNORMAL LOW (ref 3.5–5.1)
Sodium: 137 mmol/L (ref 135–145)

## 2020-09-27 LAB — BLOOD GAS, ARTERIAL
Acid-base deficit: 0.9 mmol/L (ref 0.0–2.0)
Bicarbonate: 23.3 mmol/L (ref 20.0–28.0)
FIO2: 32
O2 Content: 3 L/min
O2 Saturation: 98.5 %
Patient temperature: 98.6
pCO2 arterial: 39 mmHg (ref 32.0–48.0)
pH, Arterial: 7.393 (ref 7.350–7.450)
pO2, Arterial: 118 mmHg — ABNORMAL HIGH (ref 83.0–108.0)

## 2020-09-27 LAB — RESP PANEL BY RT-PCR (FLU A&B, COVID) ARPGX2
Influenza A by PCR: NEGATIVE
Influenza B by PCR: NEGATIVE
SARS Coronavirus 2 by RT PCR: NEGATIVE

## 2020-09-27 LAB — MAGNESIUM: Magnesium: 1.9 mg/dL (ref 1.7–2.4)

## 2020-09-27 MED ORDER — POTASSIUM CHLORIDE 10 MEQ/100ML IV SOLN
10.0000 meq | INTRAVENOUS | Status: AC
  Administered 2020-09-27 – 2020-09-28 (×6): 10 meq via INTRAVENOUS
  Filled 2020-09-27 (×3): qty 100

## 2020-09-27 MED ORDER — LORAZEPAM 2 MG/ML IJ SOLN
1.0000 mg | INTRAMUSCULAR | Status: AC | PRN
  Administered 2020-09-27: 4 mg via INTRAVENOUS
  Administered 2020-09-28 (×4): 2 mg via INTRAVENOUS
  Administered 2020-09-28: 3 mg via INTRAVENOUS
  Filled 2020-09-27 (×2): qty 1
  Filled 2020-09-27: qty 2
  Filled 2020-09-27 (×2): qty 1
  Filled 2020-09-27 (×2): qty 2
  Filled 2020-09-27: qty 1

## 2020-09-27 MED ORDER — LORAZEPAM 1 MG PO TABS: 0.0000 mg | ORAL_TABLET | Freq: Four times a day (QID) | ORAL | Status: DC

## 2020-09-27 MED ORDER — DEXMEDETOMIDINE HCL IN NACL 200 MCG/50ML IV SOLN
0.4000 ug/kg/h | INTRAVENOUS | Status: DC
  Administered 2020-09-27: 0.9 ug/kg/h via INTRAVENOUS
  Administered 2020-09-27: 0.4 ug/kg/h via INTRAVENOUS
  Administered 2020-09-27: 1.2 ug/kg/h via INTRAVENOUS
  Administered 2020-09-27: 0.4 ug/kg/h via INTRAVENOUS
  Administered 2020-09-27: 1.2 ug/kg/h via INTRAVENOUS
  Filled 2020-09-27 (×3): qty 50

## 2020-09-27 MED ORDER — LORAZEPAM 1 MG PO TABS: 0.0000 mg | ORAL_TABLET | Freq: Two times a day (BID) | ORAL | Status: DC

## 2020-09-27 MED ORDER — THIAMINE HCL 100 MG/ML IJ SOLN: 100.0000 mg | Freq: Every day | INTRAMUSCULAR | Status: DC

## 2020-09-27 MED ORDER — ACETAMINOPHEN 650 MG RE SUPP: 650.0000 mg | Freq: Four times a day (QID) | RECTAL | Status: DC | PRN

## 2020-09-27 MED ORDER — ADULT MULTIVITAMIN W/MINERALS CH
1.0000 | ORAL_TABLET | Freq: Every day | ORAL | Status: DC
  Administered 2020-09-27 – 2020-10-04 (×6): 1 via ORAL
  Filled 2020-09-27 (×6): qty 1

## 2020-09-27 MED ORDER — THIAMINE HCL 100 MG PO TABS: 100.0000 mg | ORAL_TABLET | Freq: Every day | ORAL | Status: DC

## 2020-09-27 MED ORDER — LORAZEPAM 2 MG/ML IJ SOLN: 0.0000 mg | Freq: Two times a day (BID) | INTRAMUSCULAR | Status: DC

## 2020-09-27 MED ORDER — LABETALOL HCL 5 MG/ML IV SOLN
5.0000 mg | INTRAVENOUS | Status: DC | PRN
  Administered 2020-09-27 – 2020-09-30 (×4): 5 mg via INTRAVENOUS
  Filled 2020-09-27 (×6): qty 4

## 2020-09-27 MED ORDER — ONDANSETRON HCL 4 MG PO TABS: 4.0000 mg | ORAL_TABLET | Freq: Four times a day (QID) | ORAL | Status: DC | PRN

## 2020-09-27 MED ORDER — LORAZEPAM 2 MG/ML IJ SOLN
4.0000 mg | Freq: Once | INTRAMUSCULAR | Status: AC
  Administered 2020-09-27: 4 mg via INTRAVENOUS

## 2020-09-27 MED ORDER — LORAZEPAM 1 MG PO TABS
2.0000 mg | ORAL_TABLET | Freq: Once | ORAL | Status: AC
  Administered 2020-09-27: 2 mg via ORAL
  Filled 2020-09-27: qty 2

## 2020-09-27 MED ORDER — FOLIC ACID 1 MG PO TABS
1.0000 mg | ORAL_TABLET | Freq: Every day | ORAL | Status: DC
  Administered 2020-09-27: 1 mg via ORAL
  Filled 2020-09-27: qty 1

## 2020-09-27 MED ORDER — HALOPERIDOL LACTATE 5 MG/ML IJ SOLN: 3.0000 mg | Freq: Once | INTRAMUSCULAR | Status: AC

## 2020-09-27 MED ORDER — LORAZEPAM 2 MG/ML IJ SOLN
0.0000 mg | Freq: Four times a day (QID) | INTRAMUSCULAR | Status: DC
  Administered 2020-09-27: 03:00:00 2 mg via INTRAVENOUS
  Filled 2020-09-27: qty 1

## 2020-09-27 MED ORDER — AMLODIPINE BESYLATE 5 MG PO TABS
5.0000 mg | ORAL_TABLET | Freq: Every day | ORAL | Status: DC
  Administered 2020-09-27: 5 mg via ORAL
  Filled 2020-09-27: qty 1

## 2020-09-27 MED ORDER — LORAZEPAM 2 MG/ML IJ SOLN
0.0000 mg | Freq: Three times a day (TID) | INTRAMUSCULAR | Status: AC
  Administered 2020-09-29 – 2020-10-01 (×3): 1 mg via INTRAVENOUS
  Filled 2020-09-27 (×3): qty 1

## 2020-09-27 MED ORDER — POTASSIUM CHLORIDE 10 MEQ/100ML IV SOLN
10.0000 meq | INTRAVENOUS | Status: DC
  Administered 2020-09-27 (×4): 10 meq via INTRAVENOUS
  Filled 2020-09-27 (×4): qty 100

## 2020-09-27 MED ORDER — LORAZEPAM 2 MG/ML IJ SOLN
0.0000 mg | INTRAMUSCULAR | Status: AC
  Administered 2020-09-27: 2 mg via INTRAVENOUS
  Administered 2020-09-27: 4 mg via INTRAVENOUS
  Administered 2020-09-27: 2 mg via INTRAVENOUS
  Administered 2020-09-28: 4 mg via INTRAVENOUS
  Administered 2020-09-28: 3 mg via INTRAVENOUS
  Administered 2020-09-28: 2 mg via INTRAVENOUS
  Administered 2020-09-28 (×2): 4 mg via INTRAVENOUS
  Administered 2020-09-29: 1 mg via INTRAVENOUS
  Administered 2020-09-29 (×2): 2 mg via INTRAVENOUS
  Filled 2020-09-27 (×4): qty 2
  Filled 2020-09-27: qty 1
  Filled 2020-09-27: qty 2
  Filled 2020-09-27 (×5): qty 1

## 2020-09-27 MED ORDER — MIDAZOLAM 50MG/50ML (1MG/ML) PREMIX INFUSION
0.5000 mg/h | INTRAVENOUS | Status: DC
  Administered 2020-09-27: 2 mg/h via INTRAVENOUS
  Filled 2020-09-27: qty 50

## 2020-09-27 MED ORDER — POTASSIUM CHLORIDE CRYS ER 20 MEQ PO TBCR
40.0000 meq | EXTENDED_RELEASE_TABLET | Freq: Once | ORAL | Status: AC
  Administered 2020-09-27: 40 meq via ORAL
  Filled 2020-09-27: qty 2

## 2020-09-27 MED ORDER — BISACODYL 10 MG RE SUPP: 10.0000 mg | Freq: Every day | RECTAL | Status: DC | PRN

## 2020-09-27 MED ORDER — MIDAZOLAM BOLUS VIA INFUSION
2.0000 mg | Freq: Once | INTRAVENOUS | Status: AC
  Administered 2020-09-27: 2 mg via INTRAVENOUS
  Filled 2020-09-27: qty 2

## 2020-09-27 MED ORDER — ONDANSETRON HCL 4 MG/2ML IJ SOLN: 4.0000 mg | Freq: Four times a day (QID) | INTRAMUSCULAR | Status: DC | PRN

## 2020-09-27 MED ORDER — HALOPERIDOL LACTATE 5 MG/ML IJ SOLN
INTRAMUSCULAR | Status: AC
  Administered 2020-09-27: 2 mg via INTRAVENOUS
  Filled 2020-09-27: qty 1

## 2020-09-27 MED ORDER — PHENOBARBITAL SODIUM 130 MG/ML IJ SOLN
130.0000 mg | Freq: Once | INTRAMUSCULAR | Status: DC
  Filled 2020-09-27: qty 2

## 2020-09-27 MED ORDER — ENOXAPARIN SODIUM 40 MG/0.4ML IJ SOSY: 40.0000 mg | PREFILLED_SYRINGE | INTRAMUSCULAR | Status: DC

## 2020-09-27 MED ORDER — CHLORHEXIDINE GLUCONATE CLOTH 2 % EX PADS
6.0000 | MEDICATED_PAD | Freq: Every day | CUTANEOUS | Status: DC
  Administered 2020-09-27 – 2020-09-30 (×4): 6 via TOPICAL

## 2020-09-27 MED ORDER — LORAZEPAM 1 MG PO TABS
1.0000 mg | ORAL_TABLET | ORAL | Status: AC | PRN
Start: 2020-09-27 — End: 2020-09-30
  Administered 2020-09-27 – 2020-09-29 (×2): 2 mg via ORAL
  Filled 2020-09-27 (×2): qty 2

## 2020-09-27 MED ORDER — LORAZEPAM 2 MG/ML IJ SOLN
1.0000 mg | Freq: Once | INTRAMUSCULAR | Status: AC
  Administered 2020-09-27: 1 mg via INTRAVENOUS
  Filled 2020-09-27: qty 1

## 2020-09-27 MED ORDER — MAGNESIUM SULFATE 2 GM/50ML IV SOLN
2.0000 g | Freq: Once | INTRAVENOUS | Status: AC
  Administered 2020-09-27: 2 g via INTRAVENOUS
  Filled 2020-09-27: qty 50

## 2020-09-27 MED ORDER — LACTATED RINGERS IV SOLN: INTRAVENOUS | Status: DC

## 2020-09-27 MED ORDER — THIAMINE HCL 100 MG PO TABS
100.0000 mg | ORAL_TABLET | Freq: Every day | ORAL | Status: DC
  Administered 2020-09-27 – 2020-10-04 (×6): 100 mg via ORAL
  Filled 2020-09-27 (×6): qty 1

## 2020-09-27 MED ORDER — ACETAMINOPHEN 325 MG PO TABS
650.0000 mg | ORAL_TABLET | Freq: Four times a day (QID) | ORAL | Status: DC | PRN
  Administered 2020-09-30: 650 mg via ORAL
  Filled 2020-09-27: qty 2

## 2020-09-27 MED ORDER — HALOPERIDOL LACTATE 5 MG/ML IJ SOLN
2.0000 mg | Freq: Four times a day (QID) | INTRAMUSCULAR | Status: DC | PRN
  Administered 2020-09-28 – 2020-09-30 (×3): 2 mg via INTRAVENOUS
  Filled 2020-09-27 (×4): qty 1

## 2020-09-27 MED ORDER — THIAMINE HCL 100 MG/ML IJ SOLN
100.0000 mg | Freq: Every day | INTRAMUSCULAR | Status: DC
  Administered 2020-09-28 – 2020-09-29 (×2): 100 mg via INTRAVENOUS
  Filled 2020-09-27 (×2): qty 2

## 2020-09-27 MED ORDER — THIAMINE HCL 100 MG/ML IJ SOLN
Freq: Once | INTRAVENOUS | Status: AC
  Filled 2020-09-27: qty 1000

## 2020-09-27 MED ORDER — HALOPERIDOL LACTATE 5 MG/ML IJ SOLN
INTRAMUSCULAR | Status: AC
  Administered 2020-09-27: 3 mg via INTRAVENOUS
  Filled 2020-09-27: qty 1

## 2020-09-27 MED ORDER — DEXTROSE IN LACTATED RINGERS 5 % IV SOLN: INTRAVENOUS | Status: DC

## 2020-09-27 MED ORDER — DEXMEDETOMIDINE HCL IN NACL 200 MCG/50ML IV SOLN
0.4000 ug/kg/h | INTRAVENOUS | Status: DC
  Administered 2020-09-27: 0.7 ug/kg/h via INTRAVENOUS
  Administered 2020-09-27: 0.6 ug/kg/h via INTRAVENOUS
  Administered 2020-09-28: 0.9 ug/kg/h via INTRAVENOUS
  Administered 2020-09-28: 0.8 ug/kg/h via INTRAVENOUS
  Administered 2020-09-28: 0.9 ug/kg/h via INTRAVENOUS
  Administered 2020-09-28: 0.6 ug/kg/h via INTRAVENOUS
  Administered 2020-09-28 (×2): 0.9 ug/kg/h via INTRAVENOUS
  Administered 2020-09-28 – 2020-09-29 (×2): 0.7 ug/kg/h via INTRAVENOUS
  Administered 2020-09-29: 0.9 ug/kg/h via INTRAVENOUS
  Administered 2020-09-29: 0.3 ug/kg/h via INTRAVENOUS
  Administered 2020-09-29: 0.9 ug/kg/h via INTRAVENOUS
  Filled 2020-09-27: qty 100
  Filled 2020-09-27 (×10): qty 50
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
## 2020-09-27 NOTE — ED Notes (Signed)
Pt is prominently Spanish speaking.  Tremors are noted.  Sitter at bedside.

## 2020-09-27 NOTE — ED Notes (Signed)
Breakfast tray provided. 

## 2020-09-27 NOTE — Progress Notes (Signed)
eLink Physician-Brief Progress Note Patient Name: Nathanial Arrighi DOB: [birth year] MRN: [id number]   Date of Service  09/27/2020  HPI/Events of Note  Patient is in 4 point restraints and needs an order for the restraints.  eICU Interventions  Restraints ordered. Versed gtt discontinued.        Migdalia Dk 09/27/2020, 8:54 PM
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
## 2020-09-27 NOTE — BH Assessment (Signed)
09/27/20 3:36 am  Per RN R. Geraldo Pitter pt just got some ativan, and he is shaking and detoxing. He is not able to participate in a TTS assessment at this time.

## 2020-09-28 DIAGNOSIS — R45850 Homicidal ideations: Secondary | ICD-10-CM

## 2020-09-28 LAB — RAPID URINE DRUG SCREEN, HOSP PERFORMED
Amphetamines: NOT DETECTED
Barbiturates: NOT DETECTED
Benzodiazepines: POSITIVE — AB
Cocaine: NOT DETECTED
Opiates: NOT DETECTED
Tetrahydrocannabinol: NOT DETECTED

## 2020-09-28 LAB — COMPREHENSIVE METABOLIC PANEL
ALT: 86 U/L — ABNORMAL HIGH (ref 0–44)
AST: 95 U/L — ABNORMAL HIGH (ref 15–41)
Albumin: 3.8 g/dL (ref 3.5–5.0)
Alkaline Phosphatase: 84 U/L (ref 38–126)
Anion gap: 9 (ref 5–15)
BUN: 10 mg/dL (ref 6–20)
CO2: 20 mmol/L — ABNORMAL LOW (ref 22–32)
Calcium: 9 mg/dL (ref 8.9–10.3)
Chloride: 107 mmol/L (ref 98–111)
Creatinine, Ser: 0.51 mg/dL — ABNORMAL LOW (ref 0.61–1.24)
GFR, Estimated: >60 mL/min (ref >60)
Glucose, Bld: 126 mg/dL — ABNORMAL HIGH (ref 70–99)
Potassium: 3.8 mmol/L (ref 3.5–5.1)
Sodium: 136 mmol/L (ref 135–145)
Total Bilirubin: 1.7 mg/dL — ABNORMAL HIGH (ref 0.3–1.2)
Total Protein: 7.3 g/dL (ref 6.5–8.1)

## 2020-09-28 LAB — CBC
HCT: 39.8 % (ref 39.0–52.0)
Hemoglobin: 13.2 g/dL (ref 13.0–17.0)
MCH: 32 pg (ref 26.0–34.0)
MCHC: 33.2 g/dL (ref 30.0–36.0)
MCV: 96.6 fL (ref 80.0–100.0)
Platelets: 64 10*3/uL — ABNORMAL LOW (ref 150–400)
RBC: 4.12 MIL/uL — ABNORMAL LOW (ref 4.22–5.81)
RDW: 15 % (ref 11.5–15.5)
WBC: 7.5 10*3/uL (ref 4.0–10.5)
nRBC: 0 % (ref 0.0–0.2)

## 2020-09-28 LAB — MAGNESIUM: Magnesium: 1.6 mg/dL — ABNORMAL LOW (ref 1.7–2.4)

## 2020-09-28 MED ORDER — SODIUM CHLORIDE 0.9 % IV SOLN
2.0000 g | Freq: Three times a day (TID) | INTRAVENOUS | Status: DC
  Administered 2020-09-28 – 2020-09-29 (×3): 2 g via INTRAVENOUS
  Filled 2020-09-28 (×3): qty 2

## 2020-09-28 MED ORDER — HYDRALAZINE HCL 20 MG/ML IJ SOLN
10.0000 mg | INTRAMUSCULAR | Status: AC | PRN
  Administered 2020-09-28 – 2020-09-29 (×3): 20 mg via INTRAVENOUS
  Filled 2020-09-28 (×3): qty 1

## 2020-09-28 MED ORDER — MAGNESIUM SULFATE 4 GM/100ML IV SOLN
4.0000 g | Freq: Once | INTRAVENOUS | Status: AC
  Administered 2020-09-28: 4 g via INTRAVENOUS
  Filled 2020-09-28: qty 100

## 2020-09-28 NOTE — Progress Notes (Signed)
eLink Physician-Brief Progress Note Patient Name: [name] DOB: 05/31/1980 MRN: [id number]   Date of Service  09/28/2020  HPI/Events of Note  Patient needs restraints orders renewed.  eICU Interventions  Restraints order entered.        Migdalia Dk 09/28/2020, 9:42 PM

## 2020-09-28 NOTE — Progress Notes (Signed)
eLink Physician-Brief Progress Note Patient Name: [name] DOB: 02/28/1981 MRN: [id number]   Date of Service  09/28/2020  HPI/Events of Note  Patient is hypertensive, heart rate < 70 so PRN iv Labetalol  cannot be given.  eICU Interventions  PRN iv Hydralazine ordered.        [name] [name] 09/28/2020, 2:43 AM
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
## 2020-09-28 NOTE — Progress Notes (Signed)
Mg 1.6  Replaced per protocol  

## 2020-09-29 ENCOUNTER — Inpatient Hospital Stay (HOSPITAL_COMMUNITY): Payer: Self-pay

## 2020-09-29 DIAGNOSIS — I10 Essential (primary) hypertension: Secondary | ICD-10-CM

## 2020-09-29 DIAGNOSIS — R451 Restlessness and agitation: Secondary | ICD-10-CM

## 2020-09-29 DIAGNOSIS — R7989 Other specified abnormal findings of blood chemistry: Secondary | ICD-10-CM

## 2020-09-29 LAB — BASIC METABOLIC PANEL
Anion gap: 7 (ref 5–15)
BUN: 11 mg/dL (ref 6–20)
CO2: 21 mmol/L — ABNORMAL LOW (ref 22–32)
Calcium: 8.4 mg/dL — ABNORMAL LOW (ref 8.9–10.3)
Chloride: 106 mmol/L (ref 98–111)
Creatinine, Ser: 0.43 mg/dL — ABNORMAL LOW (ref 0.61–1.24)
GFR, Estimated: >60 mL/min (ref >60)
Glucose, Bld: 106 mg/dL — ABNORMAL HIGH (ref 70–99)
Potassium: 3.5 mmol/L (ref 3.5–5.1)
Sodium: 134 mmol/L — ABNORMAL LOW (ref 135–145)

## 2020-09-29 LAB — COMPREHENSIVE METABOLIC PANEL
ALT: 63 U/L — ABNORMAL HIGH (ref 0–44)
AST: 47 U/L — ABNORMAL HIGH (ref 15–41)
Albumin: 3.6 g/dL (ref 3.5–5.0)
Alkaline Phosphatase: 81 U/L (ref 38–126)
Anion gap: 6 (ref 5–15)
BUN: 8 mg/dL (ref 6–20)
CO2: 22 mmol/L (ref 22–32)
Calcium: 8.6 mg/dL — ABNORMAL LOW (ref 8.9–10.3)
Chloride: 108 mmol/L (ref 98–111)
Creatinine, Ser: 0.4 mg/dL — ABNORMAL LOW (ref 0.61–1.24)
GFR, Estimated: >60 mL/min (ref >60)
Glucose, Bld: 131 mg/dL — ABNORMAL HIGH (ref 70–99)
Potassium: 3.2 mmol/L — ABNORMAL LOW (ref 3.5–5.1)
Sodium: 136 mmol/L (ref 135–145)
Total Bilirubin: 1.8 mg/dL — ABNORMAL HIGH (ref 0.3–1.2)
Total Protein: 7.2 g/dL (ref 6.5–8.1)

## 2020-09-29 LAB — CBC
HCT: 39.7 % (ref 39.0–52.0)
Hemoglobin: 13.2 g/dL (ref 13.0–17.0)
MCH: 32.1 pg (ref 26.0–34.0)
MCHC: 33.2 g/dL (ref 30.0–36.0)
MCV: 96.6 fL (ref 80.0–100.0)
Platelets: 74 10*3/uL — ABNORMAL LOW (ref 150–400)
RBC: 4.11 MIL/uL — ABNORMAL LOW (ref 4.22–5.81)
RDW: 14.5 % (ref 11.5–15.5)
WBC: 5.2 10*3/uL (ref 4.0–10.5)
nRBC: 0 % (ref 0.0–0.2)

## 2020-09-29 LAB — MAGNESIUM
Magnesium: 1.7 mg/dL (ref 1.7–2.4)
Magnesium: 1.9 mg/dL (ref 1.7–2.4)

## 2020-09-29 LAB — GLUCOSE, CAPILLARY
Glucose-Capillary: 106 mg/dL — ABNORMAL HIGH (ref 70–99)
Glucose-Capillary: 111 mg/dL — ABNORMAL HIGH (ref 70–99)
Glucose-Capillary: 72 mg/dL (ref 70–99)

## 2020-09-29 LAB — MRSA PCR SCREENING: MRSA by PCR: NEGATIVE

## 2020-09-29 LAB — PHOSPHORUS: Phosphorus: 2.5 mg/dL (ref 2.5–4.6)

## 2020-09-29 MED ORDER — MAGNESIUM SULFATE 2 GM/50ML IV SOLN
2.0000 g | Freq: Once | INTRAVENOUS | Status: AC
  Administered 2020-09-29: 2 g via INTRAVENOUS
  Filled 2020-09-29: qty 50

## 2020-09-29 MED ORDER — POTASSIUM CHLORIDE 10 MEQ/100ML IV SOLN
10.0000 meq | INTRAVENOUS | Status: AC
  Administered 2020-09-29 (×6): 10 meq via INTRAVENOUS
  Filled 2020-09-29 (×7): qty 100

## 2020-09-29 MED ORDER — SODIUM CHLORIDE 0.9 % IV SOLN
2.0000 g | Freq: Three times a day (TID) | INTRAVENOUS | Status: DC
  Administered 2020-09-29 – 2020-09-30 (×2): 2 g via INTRAVENOUS
  Filled 2020-09-29 (×2): qty 2

## 2020-09-29 MED ORDER — FOLIC ACID 5 MG/ML IJ SOLN
1.0000 mg | Freq: Every day | INTRAMUSCULAR | Status: DC
  Administered 2020-09-29 – 2020-09-30 (×2): 1 mg via INTRAVENOUS
  Filled 2020-09-29 (×2): qty 0.2

## 2020-09-29 MED ORDER — LORAZEPAM 2 MG/ML IJ SOLN
2.0000 mg | Freq: Four times a day (QID) | INTRAMUSCULAR | Status: DC
  Administered 2020-09-29 – 2020-09-30 (×4): 2 mg via INTRAVENOUS
  Filled 2020-09-29 (×4): qty 1

## 2020-09-29 MED ORDER — POTASSIUM CHLORIDE CRYS ER 20 MEQ PO TBCR
40.0000 meq | EXTENDED_RELEASE_TABLET | Freq: Once | ORAL | Status: AC
  Administered 2020-09-29: 40 meq via ORAL
  Filled 2020-09-29: qty 2

## 2020-09-29 MED ORDER — HALOPERIDOL LACTATE 5 MG/ML IJ SOLN
5.0000 mg | Freq: Four times a day (QID) | INTRAMUSCULAR | Status: DC
  Administered 2020-09-29 – 2020-09-30 (×5): 5 mg via INTRAVENOUS
  Filled 2020-09-29 (×5): qty 1

## 2020-09-29 MED ORDER — QUETIAPINE FUMARATE 25 MG PO TABS
25.0000 mg | ORAL_TABLET | Freq: Two times a day (BID) | ORAL | Status: DC
  Administered 2020-09-29 – 2020-10-04 (×11): 25 mg via ORAL
  Filled 2020-09-29 (×11): qty 1

## 2020-09-29 MED ORDER — LACTATED RINGERS IV SOLN: INTRAVENOUS | Status: DC
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
## 2020-09-29 NOTE — Plan of Care (Signed)
Discussed with patient through interpreter plan of care for the evening, pain management and restraints with some teach back displayed.  Problem: Education: Goal: Knowledge of General Education information will improve Description: Including pain rating scale, medication(s)/side effects and non-pharmacologic comfort measures Outcome: Progressing   Problem: Health Behavior/Discharge Planning: Goal: Ability to manage health-related needs will improve Outcome: Progressing

## 2020-09-29 NOTE — Consult Note (Signed)
Psych consult placed for homicidal ideations.  Patient is seen and attempted to assess, however he remains sedated.  He is observed to be deep in slumber, audible snoring, and remains in four-point restraints.  Safety sitter is at bedside.  Patient is also currently on Precedex, in which nursing staff reports adjusting throughout the day to begin to wean.  Nursing staff also reports some visual hallucinations, delusions of grandeur when patient is awake.  Will attempt to reassess tomorrow.  Patient remains under IVC at this time.  -We will place order for quetiapine 25 mg p.o. twice daily to manage psychosis. EKG normal  -Will attempt to reassess tomorrow.

## 2020-09-29 NOTE — Progress Notes (Signed)
K+ 3.2, Mg 1.7 Replaced per protocol

## 2020-09-30 LAB — BASIC METABOLIC PANEL
Anion gap: 7 (ref 5–15)
BUN: 9 mg/dL (ref 6–20)
CO2: 21 mmol/L — ABNORMAL LOW (ref 22–32)
Calcium: 8.8 mg/dL — ABNORMAL LOW (ref 8.9–10.3)
Chloride: 107 mmol/L (ref 98–111)
Creatinine, Ser: 0.32 mg/dL — ABNORMAL LOW (ref 0.61–1.24)
GFR, Estimated: >60 mL/min (ref >60)
Glucose, Bld: 103 mg/dL — ABNORMAL HIGH (ref 70–99)
Potassium: 3.9 mmol/L (ref 3.5–5.1)
Sodium: 135 mmol/L (ref 135–145)

## 2020-09-30 LAB — GLUCOSE, CAPILLARY
Glucose-Capillary: 108 mg/dL — ABNORMAL HIGH (ref 70–99)
Glucose-Capillary: 168 mg/dL — ABNORMAL HIGH (ref 70–99)
Glucose-Capillary: 121 mg/dL — ABNORMAL HIGH (ref 70–99)

## 2020-09-30 LAB — MAGNESIUM: Magnesium: 1.9 mg/dL (ref 1.7–2.4)

## 2020-09-30 MED ORDER — MAGNESIUM SULFATE 2 GM/50ML IV SOLN
2.0000 g | Freq: Once | INTRAVENOUS | Status: AC
  Administered 2020-09-30: 2 g via INTRAVENOUS
  Filled 2020-09-30: qty 50

## 2020-09-30 MED ORDER — LIP MEDEX EX OINT
TOPICAL_OINTMENT | CUTANEOUS | Status: AC
  Filled 2020-09-30: qty 7

## 2020-09-30 MED ORDER — CARVEDILOL 6.25 MG PO TABS
6.2500 mg | ORAL_TABLET | Freq: Two times a day (BID) | ORAL | Status: DC
  Administered 2020-09-30 – 2020-10-04 (×9): 6.25 mg via ORAL
  Filled 2020-09-30 (×9): qty 1

## 2020-09-30 MED ORDER — AMLODIPINE BESYLATE 10 MG PO TABS
10.0000 mg | ORAL_TABLET | Freq: Every day | ORAL | Status: DC
  Administered 2020-09-30 – 2020-10-04 (×5): 10 mg via ORAL
  Filled 2020-09-30 (×5): qty 1

## 2020-09-30 MED ORDER — POTASSIUM CHLORIDE 10 MEQ/100ML IV SOLN
10.0000 meq | INTRAVENOUS | Status: AC
  Administered 2020-09-30 (×4): 10 meq via INTRAVENOUS
  Filled 2020-09-30 (×4): qty 100

## 2020-09-30 MED ORDER — FOLIC ACID 1 MG PO TABS
1.0000 mg | ORAL_TABLET | Freq: Every day | ORAL | Status: DC
  Administered 2020-10-01 – 2020-10-04 (×4): 1 mg via ORAL
  Filled 2020-09-30 (×4): qty 1

## 2020-09-30 MED ORDER — GUAIFENESIN-DM 100-10 MG/5ML PO SYRP
5.0000 mL | ORAL_SOLUTION | ORAL | Status: DC | PRN
  Administered 2020-09-30 – 2020-10-02 (×2): 5 mL via ORAL
  Filled 2020-09-30 (×2): qty 10

## 2020-09-30 MED ORDER — PHENOBARBITAL 32.4 MG PO TABS
32.4000 mg | ORAL_TABLET | Freq: Three times a day (TID) | ORAL | Status: AC
  Administered 2020-10-02 – 2020-10-03 (×6): 32.4 mg via ORAL
  Filled 2020-09-30 (×6): qty 1

## 2020-09-30 MED ORDER — PHENOBARBITAL 32.4 MG PO TABS
64.8000 mg | ORAL_TABLET | Freq: Three times a day (TID) | ORAL | Status: AC
  Administered 2020-09-30 – 2020-10-01 (×6): 64.8 mg via ORAL
  Filled 2020-09-30 (×6): qty 2
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
## 2020-09-30 NOTE — Plan of Care (Signed)
Discussed with patient plan of care for the evening, pain management and medications with some teach back displayed  Problem: Education: Goal: Knowledge of General Education information will improve Description: Including pain rating scale, medication(s)/side effects and non-pharmacologic comfort measures Outcome: Progressing   Problem: Health Behavior/Discharge Planning: Goal: Ability to manage health-related needs will improve Outcome: Progressing   

## 2020-10-01 LAB — GLUCOSE, CAPILLARY
Glucose-Capillary: 106 mg/dL — ABNORMAL HIGH (ref 70–99)
Glucose-Capillary: 140 mg/dL — ABNORMAL HIGH (ref 70–99)
Glucose-Capillary: 104 mg/dL — ABNORMAL HIGH (ref 70–99)

## 2020-10-01 LAB — HEMOGLOBIN A1C
Hgb A1c MFr Bld: 5.7 % — ABNORMAL HIGH (ref 4.8–5.6)
Mean Plasma Glucose: 117 mg/dL

## 2020-10-01 NOTE — Progress Notes (Signed)
[facility]                                                                              Patient Demographics  [name], is a 40 y.o. male, DOB - 07-17-1980, MWU:[id number]  Admit date - 09/26/2020   Admitting Physician Rolly Salter, MD  Outpatient Primary MD for the patient is Pcp, No  Outpatient specialists:   LOS - 4  days   Medical records reviewed and are as summarized below:    No chief complaint on file.      Brief summary   Patient is a 40 year old male with history of alcohol abuse, DTs, multiple IVCs in the past, psychosis, alcoholic hepatitis, hypertension was brought in by [employer] is IVC.  Patient was firing gun in his house with the kids in the house, was hallucinating and seeing people that were not there.  Alcohol level was 94, labs showed potassium 2.9. Patient had refractory agitation and DTs and required Ativan along with Haldol and Valium, multiple providers and law enforcement to hold him down.  CCM was consulted for Precedex, currently off. Patient transferred to [facility] service, assumed care on 7/2   Assessment & Plan    Principal Problem:   DTs (delirium tremens) (HCC) and acute withdrawals with underlying history of alcohol abuse-present on admission -Continue CIWA protocol with Ativan, off Precedex drip -Currently on phenobarb p.o. taper, will continue -Still somewhat tremulous however mental status improving, alert and oriented -Follow electrolytes  Active Problems:   Hypokalemia, hypomagnesemia -Resolved  Mild transaminitis -Likely due to alcoholic hepatitis, currently improving  Essential hypertension -BP currently stable  Firing gun at home, suspected Homicidal behavior, IVCed.,  Acute psychosis, DTs -Psychiatry following, recommended psychiatric inpatient admission when medically cleared -Continue Seroquel 25 mg p.o. twice daily -Continue sitter  Code Status: Full CODE STATUS DVT Prophylaxis:  Place  and maintain sequential compression device Start: 09/27/20 2044   Level of Care: Level of care: Progressive Family Communication: No family at the bedside  Disposition Plan:     Status is: Inpatient  Remains inpatient appropriate because:Inpatient level of care appropriate due to severity of illness  Dispo: The patient is from: Home              Anticipated d/c is to: Inpt psych                Patient currently is not medically stable to d/c.   Difficult to place patient No      Time Spent in minutes 35 minutes  Procedures:  None  Consultants:   CCM Psychiatry  Antimicrobials:   Anti-infectives (From admission, onward)    Start     Dose/Rate Route Frequency Ordered Stop   09/29/20 2200  ceFEPIme (MAXIPIME) 2 g in sodium chloride 0.9 % 100 mL IVPB  Status:  Discontinued        2 g 200 mL/hr over 30 Minutes Intravenous Every 8 hours 09/29/20 1414 09/30/20 0707   09/28/20 2200  aztreonam (AZACTAM) 2 g in sodium chloride 0.9 % 100 mL IVPB  Status:  Discontinued        2  g 200 mL/hr over 30 Minutes Intravenous Every 8 hours 09/28/20 2045 09/29/20 1414          Medications  Scheduled Meds:  amLODipine  10 mg Oral Daily   carvedilol  6.25 mg Oral BID WC   folic acid  1 mg Oral Daily   LORazepam  0-4 mg Intravenous Q8H   multivitamin with minerals  1 tablet Oral Daily   PHENobarbital  64.8 mg Oral TID   Followed by   Melene Muller ON 10/02/2020] phenobarbital  32.4 mg Oral TID   QUEtiapine  25 mg Oral BID   thiamine  100 mg Oral Daily   Continuous Infusions: PRN Meds:.acetaminophen **OR** acetaminophen, bisacodyl, guaiFENesin-dextromethorphan, labetalol, ondansetron **OR** ondansetron (ZOFRAN) IV      Subjective:   [name] was seen and examined today.  Fairly alert and awake, no acute issues overnight, still somewhat tremulous.  No pain.  CIWA 5 at 4 AM Objective:   Vitals:   09/30/20 2307 10/01/20 0400 10/01/20 0456 10/01/20 1120  BP: (!)  147/86 107/88 95/78 112/65  Pulse: 75 89 78 70  Resp: (!) 22  15 18   Temp: 99.8 F (37.7 C)  98.2 F (36.8 C) (!) 96 F (35.6 C)  TempSrc: Oral  Oral   SpO2: 98%  92% 100%  Weight: 76.4 kg     Height:        Intake/Output Summary (Last 24 hours) at 10/01/2020 1121 Last data filed at 09/30/2020 1950 Gross per 24 hour  Intake 717.89 ml  Output 1000 ml  Net -282.11 ml     Wt Readings from Last 3 Encounters:  09/30/20 76.4 kg  11/18/19 82.1 kg     Exam General: Alert and oriented x 3, NAD Cardiovascular: S1 S2 auscultated, no murmurs, RRR Respiratory: CTA B Gastrointestinal: Soft, nontender, nondistended, + bowel sounds Ext: no pedal edema bilaterally Neuro: moving all 4 extremities spontaneously   Data Reviewed:  I have personally reviewed following labs and imaging studies  Micro Results Recent Results (from the past 240 hour(s))  Resp Panel by RT-PCR (Flu A&B, Covid) Nasopharyngeal Swab     Status: None   Collection Time: 09/27/20  3:16 AM   Specimen: Nasopharyngeal Swab; Nasopharyngeal(NP) swabs in vial transport medium  Result Value Ref Range Status   SARS Coronavirus 2 by RT PCR NEGATIVE NEGATIVE Final    Comment: (NOTE) SARS-CoV-2 target nucleic acids are NOT DETECTED.  The SARS-CoV-2 RNA is generally detectable in upper respiratory specimens during the acute phase of infection. The lowest concentration of SARS-CoV-2 viral copies this assay can detect is 138 copies/mL. A negative result does not preclude SARS-Cov-2 infection and should not be used as the sole basis for treatment or other patient management decisions. A negative result may occur with  improper specimen collection/handling, submission of specimen other than nasopharyngeal swab, presence of viral mutation(s) within the areas targeted by this assay, and inadequate number of viral copies(<138 copies/mL). A negative result must be combined with clinical observations, patient history, and  epidemiological information. The expected result is Negative.  Fact Sheet for Patients:  09/29/20  Fact Sheet for Healthcare Providers:  BloggerCourse.com  This test is no t yet approved or cleared by the [place] FDA and  has been authorized for detection and/or diagnosis of SARS-CoV-2 by FDA under an Emergency Use Authorization (EUA). This EUA will remain  in effect (meaning this test can be used) for the duration of the COVID-19 declaration under Section 564(b)(1) of  the Act, 21 U.S.C.section 360bbb-3(b)(1), unless the authorization is terminated  or revoked sooner.       Influenza A by PCR NEGATIVE NEGATIVE Final   Influenza B by PCR NEGATIVE NEGATIVE Final    Comment: (NOTE) The Xpert Xpress SARS-CoV-2/FLU/RSV plus assay is intended as an aid in the diagnosis of influenza from Nasopharyngeal swab specimens and should not be used as a sole basis for treatment. Nasal washings and aspirates are unacceptable for Xpert Xpress SARS-CoV-2/FLU/RSV testing.  Fact Sheet for Patients: BloggerCourse.com  Fact Sheet for Healthcare Providers: Serious[employer]ker.it  This test is not yet approved or cleared by the [place] FDA and has been authorized for detection and/or diagnosis of SARS-CoV-2 by FDA under an Emergency Use Authorization (EUA). This EUA will remain in effect (meaning this test can be used) for the duration of the COVID-19 declaration under Section 564(b)(1) of the Act, 21 U.S.C. section 360bbb-3(b)(1), unless the authorization is terminated or revoked.  Performed at [facility], [address], [place], [place] [address]   Culture, blood (routine x 2)     Status: None (Preliminary result)   Collection Time: 09/28/20  8:49 PM   Specimen: BLOOD  Result Value Ref Range Status   Specimen Description   Final    BLOOD RIGHT  HAND Performed at [facility], [address] [place], [place] [address]    Special Requests   Final    BOTTLES DRAWN AEROBIC ONLY Blood Culture adequate volume Performed at [facility], [address] [place], [place] [address]    Culture   Final    NO GROWTH 2 DAYS Performed at [facility], [address], [place], [place] [address]    Report Status PENDING  Incomplete  Culture, blood (routine x 2)     Status: None (Preliminary result)   Collection Time: 09/28/20  8:49 PM   Specimen: BLOOD  Result Value Ref Range Status   Specimen Description   Final    BLOOD LEFT WRIST Performed at [facility], [address] [place], [place] [address]    Special Requests   Final    BOTTLES DRAWN AEROBIC AND ANAEROBIC Blood Culture adequate volume Performed at [facility], [address], [place], [place] [address]    Culture   Final    NO GROWTH 2 DAYS Performed at [facility], [address], [place], [place] [address]    Report Status PENDING  Incomplete  MRSA PCR Screening     Status: None   Collection Time: 09/29/20  7:17 PM  Result Value Ref Range Status   MRSA by PCR NEGATIVE NEGATIVE Final    Comment:        The GeneXpert MRSA Assay (FDA approved for NASAL specimens only), is one component of a comprehensive MRSA colonization surveillance program. It is not intended to diagnose MRSA infection nor to guide or monitor treatment for MRSA infections. Performed at [facility], [address], [place], [place] [address]     Radiology Reports DG CHEST PORT 1 VIEW  Result Date: 09/29/2020 CLINICAL DATA:  Fever EXAM: PORTABLE CHEST 1 VIEW COMPARISON:  08/08/2009 FINDINGS: Lungs volumes are small, but are symmetric and are clear. No pneumothorax or pleural effusion. Cardiac size within normal limits. Pulmonary vascularity is normal. Osseous structures are  age-appropriate. No acute bone abnormality. IMPRESSION: No active disease. Electronically Signed   By: Helyn Numbers MD  On: 09/29/2020 05:20    Lab Data:  CBC: Recent Labs  Lab 09/26/20 2254 09/28/20 0245 09/29/20 1527  WBC 7.2 7.5 5.2  HGB 14.0 13.2 13.2  HCT 40.9 39.8 39.7  MCV 92.3 96.6 96.6  PLT 73* 64* 74*   Basic Metabolic Panel: Recent Labs  Lab 09/27/20 1347 09/28/20 0245 09/29/20 0330 09/29/20 1527 09/30/20 0237  NA 137 136 136 134* 135  K 2.9* 3.8 3.2* 3.5 3.9  CL 104 107 108 106 107  CO2 24 20* 22 21* 21*  GLUCOSE 140* 126* 131* 106* 103*  BUN 16 10 8 11 9   CREATININE 0.65 0.51* 0.40* 0.43* 0.32*  CALCIUM 9.3 9.0 8.6* 8.4* 8.8*  MG 1.9 1.6* 1.7 1.9 1.9  PHOS  --   --  2.5  --   --    GFR: Estimated Creatinine Clearance: 111.9 mL/min (A) (by C-G formula based on SCr of 0.32 mg/dL (L)). Liver Function Tests: Recent Labs  Lab 09/26/20 2254 09/28/20 0245 09/29/20 0330  AST 101* 95* 47*  ALT 89* 86* 63*  ALKPHOS 100 84 81  BILITOT 1.4* 1.7* 1.8*  PROT 9.0* 7.3 7.2  ALBUMIN 4.8 3.8 3.6   No results for input(s): LIPASE, AMYLASE in the last 168 hours. No results for input(s): AMMONIA in the last 168 hours. Coagulation Profile: No results for input(s): INR, PROTIME in the last 168 hours. Cardiac Enzymes: No results for input(s): CKTOTAL, CKMB, CKMBINDEX, TROPONINI in the last 168 hours. BNP (last 3 results) No results for input(s): PROBNP in the last 8760 hours. HbA1C: Recent Labs    09/29/20 1527  HGBA1C 5.7*   CBG: Recent Labs  Lab 09/29/20 2332 09/30/20 0556 09/30/20 1145 09/30/20 1751 10/01/20 0556  GLUCAP 106* 108* 168* 121* 106*   Lipid Profile: No results for input(s): CHOL, HDL, LDLCALC, TRIG, CHOLHDL, LDLDIRECT in the last 72 hours. Thyroid Function Tests: No results for input(s): TSH, T4TOTAL, FREET4, T3FREE, THYROIDAB in the last 72 hours. Anemia Panel: No results for input(s): VITAMINB12, FOLATE, FERRITIN, TIBC, IRON,  RETICCTPCT in the last 72 hours. Urine analysis:    Component Value Date/Time   COLORURINE STRAW (A) 11/12/2019 1227   APPEARANCEUR CLEAR 11/12/2019 1227   LABSPEC 1.002 (L) 11/12/2019 1227   PHURINE 6.0 11/12/2019 1227   GLUCOSEU NEGATIVE 11/12/2019 1227   HGBUR NEGATIVE 11/12/2019 1227   BILIRUBINUR NEGATIVE 11/12/2019 1227   KETONESUR NEGATIVE 11/12/2019 1227   PROTEINUR NEGATIVE 11/12/2019 1227   NITRITE NEGATIVE 11/12/2019 1227   LEUKOCYTESUR NEGATIVE 11/12/2019 1227     Gussie Murton M.D. [facility] 10/01/2020, 11:21 AM  Available via Epic secure chat 7am-7pm After 7 pm, please refer to night coverage provider listed on amion.

## 2020-10-01 NOTE — Progress Notes (Signed)
Patient transported with Charge RN and sitter in stable condition to room 1444;  Report given and told may have to give prn BP medication for SBP>160.  It wasn't needed and bedtime medications were given prior to transfer.

## 2020-10-01 NOTE — Progress Notes (Signed)
Pt continues to sleep throughout day, wake to eat and go to BR, very unsteady on his feet. He wake to take meds and drink liquids then back to sleep , tremors continues. Wife at bedside. Wake and talking with her. Will continue to monitor.SRP, RN

## 2020-10-02 LAB — BASIC METABOLIC PANEL
Anion gap: 7 (ref 5–15)
BUN: 14 mg/dL (ref 6–20)
CO2: 24 mmol/L (ref 22–32)
Calcium: 9.1 mg/dL (ref 8.9–10.3)
Chloride: 104 mmol/L (ref 98–111)
Creatinine, Ser: 0.53 mg/dL — ABNORMAL LOW (ref 0.61–1.24)
GFR, Estimated: >60 mL/min (ref >60)
Glucose, Bld: 98 mg/dL (ref 70–99)
Potassium: 3.8 mmol/L (ref 3.5–5.1)
Sodium: 135 mmol/L (ref 135–145)

## 2020-10-02 LAB — GLUCOSE, CAPILLARY
Glucose-Capillary: 110 mg/dL — ABNORMAL HIGH (ref 70–99)
Glucose-Capillary: 113 mg/dL — ABNORMAL HIGH (ref 70–99)
Glucose-Capillary: 116 mg/dL — ABNORMAL HIGH (ref 70–99)
Glucose-Capillary: 120 mg/dL — ABNORMAL HIGH (ref 70–99)
Glucose-Capillary: 95 mg/dL (ref 70–99)

## 2020-10-02 LAB — MAGNESIUM: Magnesium: 2.1 mg/dL (ref 1.7–2.4)

## 2020-10-02 NOTE — TOC Progression Note (Signed)
IVC renewed with magistrate 10/02/2020. TOC to follow.

## 2020-10-02 NOTE — Progress Notes (Signed)
[facility]                                                                              Patient Demographics  [name], is a 40 y.o. male, DOB - Jan 10, 1981, [id number]  Admit date - 09/26/2020   Admitting Physician Johnny Salter, MD  Outpatient Primary MD for the patient is [name]  Outpatient specialists:   LOS - 5  days   Medical records reviewed and are as summarized below:    Miller chief complaint on file.      Brief summary   Patient is a 40 year old male with history of alcohol abuse, DTs, multiple IVCs in the past, psychosis, alcoholic hepatitis, hypertension was brought in by [employer] is IVC.  Patient was firing gun in his house with the kids in the house, was hallucinating and seeing people that were not there.  Alcohol level was 94, labs showed potassium 2.9. Patient had refractory agitation and DTs and required Ativan along with Haldol and Valium, multiple providers and law enforcement to hold him down.  CCM was consulted for Precedex, currently off. Patient transferred to [facility] service, assumed care on 7/2   Assessment & Plan    Principal Problem:   DTs (delirium tremens) (HCC) and acute withdrawals with underlying history of alcohol abuse-present on admission -Continue CIWA protocol with Ativan, off Precedex drip -Overall improving, alert and oriented, continue phenobarb taper  Active Problems:   Hypokalemia, hypomagnesemia -Resolved  Mild transaminitis -Likely due to alcoholic hepatitis, currently improving  Essential hypertension -BP stable  Firing gun at home, suspected Homicidal behavior, IVCed.,  Acute psychosis, DTs -Psychiatry following, recommended psychiatric inpatient admission when medically cleared -Continue Seroquel 25 mg p.o. twice daily -Continue sitter  Code Status: Full CODE STATUS DVT Prophylaxis:  Place and maintain sequential compression device Start: 09/27/20 2044   Level of Care: Level of  care: Progressive Family Communication: Miller family at the bedside  Disposition Plan:     Status is: Inpatient  Remains inpatient appropriate because:Inpatient level of care appropriate due to severity of illness  Dispo: The patient is from: Home              Anticipated d/c is to: Inpt psych                Patient currently is not medically stable to d/c.  Needs IVC renewed, plan for discharge to inpatient psychiatric facility   Difficult to place patient Miller      Time Spent in minutes 25 minutes  Procedures:  None  Consultants:   CCM Psychiatry  Antimicrobials:   Anti-infectives (From admission, onward)    Start     Dose/Rate Route Frequency Ordered Stop   09/29/20 2200  ceFEPIme (MAXIPIME) 2 g in sodium chloride 0.9 % 100 mL IVPB  Status:  Discontinued        2 g 200 mL/hr over 30 Minutes Intravenous Every 8 hours 09/29/20 1414 09/30/20 0707   09/28/20 2200  aztreonam (AZACTAM) 2 g in sodium chloride 0.9 % 100 mL IVPB  Status:  Discontinued        2 g  200 mL/hr over 30 Minutes Intravenous Every 8 hours 09/28/20 2045 09/29/20 1414          Medications  Scheduled Meds:  amLODipine  10 mg Oral Daily   carvedilol  6.25 mg Oral BID WC   folic acid  1 mg Oral Daily   multivitamin with minerals  1 tablet Oral Daily   phenobarbital  32.4 mg Oral TID   QUEtiapine  25 mg Oral BID   thiamine  100 mg Oral Daily   Continuous Infusions: PRN Meds:.acetaminophen **OR** acetaminophen, bisacodyl, guaiFENesin-dextromethorphan, labetalol, ondansetron **OR** ondansetron (ZOFRAN) IV      Subjective:   [name] was seen and examined today.  Alert and awake, Miller acute issues overnight.  Miller fevers.  Miller pain tolerating diet.  Still somewhat tremulous.   Objective:   Vitals:   10/02/20 0700 10/02/20 0800 10/02/20 0900 10/02/20 1100  BP: 138/79     Pulse: 67     Resp: 18 14 14 18   Temp: 98.3 F (36.8 C)     TempSrc: Oral     SpO2: 97%     Weight:       Height:        Intake/Output Summary (Last 24 hours) at 10/02/2020 1301 Last data filed at 10/02/2020 0915 Gross per 24 hour  Intake 805 ml  Output --  Net 805 ml     Wt Readings from Last 3 Encounters:  09/30/20 76.4 kg  11/18/19 82.1 kg    Physical Exam General: Alert and oriented x 3, NAD Cardiovascular: S1 S2 clear, RRR. Miller pedal edema b/l Respiratory: CTAB, Miller wheezing, rales or rhonchi Gastrointestinal: Soft, nontender, nondistended, NBS Ext: Miller pedal edema bilaterally Neuro: Miller new deficits Psych: alert and oriented   Data Reviewed:  I have personally reviewed following labs and imaging studies  Micro Results Recent Results (from the past 240 hour(s))  Resp Panel by RT-PCR (Flu A&B, Covid) Nasopharyngeal Swab     Status: None   Collection Time: 09/27/20  3:16 AM   Specimen: Nasopharyngeal Swab; Nasopharyngeal(NP) swabs in vial transport medium  Result Value Ref Range Status   SARS Coronavirus 2 by RT PCR NEGATIVE NEGATIVE Final    Comment: (NOTE) SARS-CoV-2 target nucleic acids are NOT DETECTED.  The SARS-CoV-2 RNA is generally detectable in upper respiratory specimens during the acute phase of infection. The lowest concentration of SARS-CoV-2 viral copies this assay can detect is 138 copies/mL. A negative result does not preclude SARS-Cov-2 infection and should not be used as the sole basis for treatment or other patient management decisions. A negative result may occur with  improper specimen collection/handling, submission of specimen other than nasopharyngeal swab, presence of viral mutation(s) within the areas targeted by this assay, and inadequate number of viral copies(<138 copies/mL). A negative result must be combined with clinical observations, patient history, and epidemiological information. The expected result is Negative.  Fact Sheet for Patients:  09/29/20  Fact Sheet for Healthcare Providers:   BloggerCourse.com  This test is Miller t yet approved or cleared by the [employer] FDA and  has been authorized for detection and/or diagnosis of SARS-CoV-2 by FDA under an Emergency Use Authorization (EUA). This EUA will remain  in effect (meaning this test can be used) for the duration of the COVID-19 declaration under Section 564(b)(1) of the Act, 21 U.S.C.section 360bbb-3(b)(1), unless the authorization is terminated  or revoked sooner.       Influenza A by PCR NEGATIVE NEGATIVE Final  Influenza B by PCR NEGATIVE NEGATIVE Final    Comment: (NOTE) The Xpert Xpress SARS-CoV-2/FLU/RSV plus assay is intended as an aid in the diagnosis of influenza from Nasopharyngeal swab specimens and should not be used as a sole basis for treatment. Nasal washings and aspirates are unacceptable for Xpert Xpress SARS-CoV-2/FLU/RSV testing.  Fact Sheet for Patients: BloggerCourse.com  Fact Sheet for Healthcare Providers: SeriousBroker[employer]it  This test is not yet approved or cleared by the [place] FDA and has been authorized for detection and/or diagnosis of SARS-CoV-2 by FDA under an Emergency Use Authorization (EUA). This EUA will remain in effect (meaning this test can be used) for the duration of the COVID-19 declaration under Section 564(b)(1) of the Act, 21 U.S.C. section 360bbb-3(b)(1), unless the authorization is terminated or revoked.  Performed at [facility], [address], [place], [place] [address]   Culture, blood (routine x 2)     Status: None (Preliminary result)   Collection Time: 09/28/20  8:49 PM   Specimen: BLOOD  Result Value Ref Range Status   Specimen Description   Final    BLOOD RIGHT HAND Performed at [facility], [address] [place], [place] [address]    Special Requests   Final    BOTTLES DRAWN AEROBIC ONLY Blood Culture adequate  volume Performed at [facility], [address], [place], [place] [address]    Culture   Final    Miller GROWTH 3 DAYS Performed at [facility], [address] [place], [place] [address]    Report Status PENDING  Incomplete  Culture, blood (routine x 2)     Status: None (Preliminary result)   Collection Time: 09/28/20  8:49 PM   Specimen: BLOOD  Result Value Ref Range Status   Specimen Description   Final    BLOOD LEFT WRIST Performed at [facility], [address], [place], [place] [address]    Special Requests   Final    BOTTLES DRAWN AEROBIC AND ANAEROBIC Blood Culture adequate volume Performed at [facility], [address] [place], [place] [address]    Culture   Final    Miller GROWTH 3 DAYS Performed at [facility], [address] [place], [place] [address]    Report Status PENDING  Incomplete  MRSA PCR Screening     Status: None   Collection Time: 09/29/20  7:17 PM  Result Value Ref Range Status   MRSA by PCR NEGATIVE NEGATIVE Final    Comment:        The GeneXpert MRSA Assay (FDA approved for NASAL specimens only), is one component of a comprehensive MRSA colonization surveillance program. It is not intended to diagnose MRSA infection nor to guide or monitor treatment for MRSA infections. Performed at [facility], [address], [place], [place] [address]     Radiology Reports DG CHEST PORT 1 VIEW  Result Date: 09/29/2020 CLINICAL DATA:  Fever EXAM: PORTABLE CHEST 1 VIEW COMPARISON:  08/08/2009 FINDINGS: Lungs volumes are small, but are symmetric and are clear. Miller pneumothorax or pleural effusion. Cardiac size within normal limits. Pulmonary vascularity is normal. Osseous structures are age-appropriate. Miller acute bone abnormality. IMPRESSION: Miller active disease. Electronically Signed   By: Helyn Numbers MD   On: 09/29/2020 05:20    Lab Data:  CBC: Recent Labs  Lab  09/26/20 2254 09/28/20 0245 09/29/20 1527  WBC 7.2 7.5 5.2  HGB 14.0 13.2  13.2  HCT 40.9 39.8 39.7  MCV 92.3 96.6 96.6  PLT 73* 64* 74*   Basic Metabolic Panel: Recent Labs  Lab 09/28/20 0245 09/29/20 0330 09/29/20 1527 09/30/20 0237 10/02/20 0346  NA 136 136 134* 135 135  K 3.8 3.2* 3.5 3.9 3.8  CL 107 108 106 107 104  CO2 20* 22 21* 21* 24  GLUCOSE 126* 131* 106* 103* 98  BUN 10 8 11 9 14   CREATININE 0.51* 0.40* 0.43* 0.32* 0.53*  CALCIUM 9.0 8.6* 8.4* 8.8* 9.1  MG 1.6* 1.7 1.9 1.9 2.1  PHOS  --  2.5  --   --   --    GFR: Estimated Creatinine Clearance: 111.9 mL/min (A) (by C-G formula based on SCr of 0.53 mg/dL (L)). Liver Function Tests: Recent Labs  Lab 09/26/20 2254 09/28/20 0245 09/29/20 0330  AST 101* 95* 47*  ALT 89* 86* 63*  ALKPHOS 100 84 81  BILITOT 1.4* 1.7* 1.8*  PROT 9.0* 7.3 7.2  ALBUMIN 4.8 3.8 3.6   Miller results for input(s): LIPASE, AMYLASE in the last 168 hours. Miller results for input(s): AMMONIA in the last 168 hours. Coagulation Profile: Miller results for input(s): INR, PROTIME in the last 168 hours. Cardiac Enzymes: Miller results for input(s): CKTOTAL, CKMB, CKMBINDEX, TROPONINI in the last 168 hours. BNP (last 3 results) Miller results for input(s): PROBNP in the last 8760 hours. HbA1C: Recent Labs    09/29/20 1527  HGBA1C 5.7*   CBG: Recent Labs  Lab 10/01/20 1123 10/01/20 1908 10/02/20 0327 10/02/20 0700 10/02/20 1117  GLUCAP 140* 104* 113* 95 110*   Lipid Profile: Miller results for input(s): CHOL, HDL, LDLCALC, TRIG, CHOLHDL, LDLDIRECT in the last 72 hours. Thyroid Function Tests: Miller results for input(s): TSH, T4TOTAL, FREET4, T3FREE, THYROIDAB in the last 72 hours. Anemia Panel: Miller results for input(s): VITAMINB12, FOLATE, FERRITIN, TIBC, IRON, RETICCTPCT in the last 72 hours. Urine analysis:    Component Value Date/Time   COLORURINE STRAW (A) 11/12/2019 1227   APPEARANCEUR CLEAR 11/12/2019 1227   LABSPEC 1.002 (L) 11/12/2019  1227   PHURINE 6.0 11/12/2019 1227   GLUCOSEU NEGATIVE 11/12/2019 1227   HGBUR NEGATIVE 11/12/2019 1227   BILIRUBINUR NEGATIVE 11/12/2019 1227   KETONESUR NEGATIVE 11/12/2019 1227   PROTEINUR NEGATIVE 11/12/2019 1227   NITRITE NEGATIVE 11/12/2019 1227   LEUKOCYTESUR NEGATIVE 11/12/2019 1227     Danyia Borunda M.D. [facility] 10/02/2020, 1:01 PM  Available via Epic secure chat 7am-7pm After 7 pm, please refer to night coverage provider listed on amion.

## 2020-10-03 LAB — BASIC METABOLIC PANEL
Anion gap: 8 (ref 5–15)
BUN: 11 mg/dL (ref 6–20)
CO2: 25 mmol/L (ref 22–32)
Calcium: 8.8 mg/dL — ABNORMAL LOW (ref 8.9–10.3)
Chloride: 101 mmol/L (ref 98–111)
Creatinine, Ser: 0.47 mg/dL — ABNORMAL LOW (ref 0.61–1.24)
GFR, Estimated: >60 mL/min (ref >60)
Glucose, Bld: 103 mg/dL — ABNORMAL HIGH (ref 70–99)
Potassium: 3.8 mmol/L (ref 3.5–5.1)
Sodium: 134 mmol/L — ABNORMAL LOW (ref 135–145)

## 2020-10-03 LAB — GLUCOSE, CAPILLARY
Glucose-Capillary: 134 mg/dL — ABNORMAL HIGH (ref 70–99)
Glucose-Capillary: 105 mg/dL — ABNORMAL HIGH (ref 70–99)
Glucose-Capillary: 111 mg/dL — ABNORMAL HIGH (ref 70–99)
Glucose-Capillary: 119 mg/dL — ABNORMAL HIGH (ref 70–99)

## 2020-10-03 NOTE — Progress Notes (Addendum)
[facility]                                                                              Patient Demographics  [name], is a 40 y.o. male, DOB - [birth year], FTD:[id number]  Admit date - 09/26/2020   Admitting Physician Rolly Salter, MD  Outpatient Primary MD for the patient is Pcp, No  Outpatient specialists:   LOS - 6  days   Medical records reviewed and are as summarized below:    No chief complaint on file.      Brief summary   Patient is a 40 year old male with history of alcohol abuse, DTs, multiple IVCs in the past, psychosis, alcoholic hepatitis, hypertension was brought in by [employer] is IVC.  Patient was firing gun in his house with the kids in the house, was hallucinating and seeing people that were not there.  Alcohol level was 94, labs showed potassium 2.9. Patient had refractory agitation and DTs and required Ativan along with Haldol and Valium, multiple providers and law enforcement to hold him down.  CCM was consulted for Precedex, currently off. Patient transferred to [facility] service, assumed care on 7/2  IVC renewed on 7/3  Assessment & Plan    Principal Problem:   DTs (delirium tremens) (HCC) and acute withdrawals with underlying history of alcohol abuse-present on admission -Continue CIWA protocol with Ativan, off Precedex drip - will complete phenobarb taper tonight  -OOB, PT eval to assess safety for ambulation   Active Problems:   Hypokalemia, hypomagnesemia -Resolved  Mild transaminitis -Likely due to alcoholic hepatitis, will recheck in a.m.  Essential hypertension -BP stable, continue Coreg, amlodipine  Firing gun at home, suspected Homicidal behavior, IVCed.,  Acute psychosis, DTs -Psychiatry following, recommended psychiatric inpatient admission when medically cleared -Continue Seroquel 25 mg p.o. twice daily -Continue sitter  Code Status: Full CODE STATUS DVT Prophylaxis:  Place and maintain sequential  compression device Start: 09/27/20 2044   Level of Care: Level of care: Progressive Family Communication: No family at the bedside  Disposition Plan:     Status is: Inpatient  Remains inpatient appropriate because:Inpatient level of care appropriate due to severity of illness  Dispo: The patient is from: Home              Anticipated d/c is to: Inpt psych                Patient currently is not medically stable to d/c.     Difficult to place patient No      Time Spent in minutes 25 minutes  Procedures:  None  Consultants:   CCM Psychiatry  Antimicrobials:   Anti-infectives (From admission, onward)    Start     Dose/Rate Route Frequency Ordered Stop   09/29/20 2200  ceFEPIme (MAXIPIME) 2 g in sodium chloride 0.9 % 100 mL IVPB  Status:  Discontinued        2 g 200 mL/hr over 30 Minutes Intravenous Every 8 hours 09/29/20 1414 09/30/20 0707   09/28/20 2200  aztreonam (AZACTAM) 2 g in sodium chloride 0.9 % 100 mL IVPB  Status:  Discontinued  2 g 200 mL/hr over 30 Minutes Intravenous Every 8 hours 09/28/20 2045 09/29/20 1414          Medications  Scheduled Meds:  amLODipine  10 mg Oral Daily   carvedilol  6.25 mg Oral BID WC   folic acid  1 mg Oral Daily   multivitamin with minerals  1 tablet Oral Daily   phenobarbital  32.4 mg Oral TID   QUEtiapine  25 mg Oral BID   thiamine  100 mg Oral Daily   Continuous Infusions: PRN Meds:.acetaminophen **OR** acetaminophen, bisacodyl, guaiFENesin-dextromethorphan, labetalol, ondansetron **OR** ondansetron (ZOFRAN) IV      Subjective:   Elie Leppo was seen and examined today.  Alert and oriented, denies any specific complaints.  Sitter at the bedside.  No acute issues overnight.  No pain.    Objective:   Vitals:   10/02/20 1700 10/02/20 1911 10/03/20 0604 10/03/20 1126  BP:  132/79 134/84 117/71  Pulse:  70 65 (!) 59  Resp: 12 20 20 16   Temp:  98.8 F (37.1 C) 98.6 F (37 C) 98.5 F  (36.9 C)  TempSrc:  Oral Oral Oral  SpO2:  99% 99% 99%  Weight:      Height:        Intake/Output Summary (Last 24 hours) at 10/03/2020 1242 Last data filed at 10/03/2020 0830 Gross per 24 hour  Intake 480 ml  Output --  Net 480 ml     Wt Readings from Last 3 Encounters:  09/30/20 76.4 kg  11/18/19 82.1 kg   Physical Exam General: Alert and oriented x 3, pleasant and cooperative Cardiovascular: S1 S2 clear, RRR. No pedal edema b/l Respiratory: CTAB, no wheezing, Gastrointestinal: Soft, nontender, nondistended, NBS Ext: no pedal edema bilaterally Neuro: no new deficits Psych: Normal affect and demeanor    Data Reviewed:  I have personally reviewed following labs and imaging studies  Micro Results Recent Results (from the past 240 hour(s))  Resp Panel by RT-PCR (Flu A&B, Covid) Nasopharyngeal Swab     Status: None   Collection Time: 09/27/20  3:16 AM   Specimen: Nasopharyngeal Swab; Nasopharyngeal(NP) swabs in vial transport medium  Result Value Ref Range Status   SARS Coronavirus 2 by RT PCR NEGATIVE NEGATIVE Final    Comment: (NOTE) SARS-CoV-2 target nucleic acids are NOT DETECTED.  The SARS-CoV-2 RNA is generally detectable in upper respiratory specimens during the acute phase of infection. The lowest concentration of SARS-CoV-2 viral copies this assay can detect is 138 copies/mL. A negative result does not preclude SARS-Cov-2 infection and should not be used as the sole basis for treatment or other patient management decisions. A negative result may occur with  improper specimen collection/handling, submission of specimen other than nasopharyngeal swab, presence of viral mutation(s) within the areas targeted by this assay, and inadequate number of viral copies(<138 copies/mL). A negative result must be combined with clinical observations, patient history, and epidemiological information. The expected result is Negative.  Fact Sheet for Patients:   09/29/20  Fact Sheet for Healthcare Providers:  BloggerCourse.com  This test is no t yet approved or cleared by the [place] FDA and  has been authorized for detection and/or diagnosis of SARS-CoV-2 by FDA under an Emergency Use Authorization (EUA). This EUA will remain  in effect (meaning this test can be used) for the duration of the COVID-19 declaration under Section 564(b)(1) of the Act, 21 U.S.C.section 360bbb-3(b)(1), unless the authorization is terminated  or revoked sooner.  Influenza A by PCR NEGATIVE NEGATIVE Final   Influenza B by PCR NEGATIVE NEGATIVE Final    Comment: (NOTE) The Xpert Xpress SARS-CoV-2/FLU/RSV plus assay is intended as an aid in the diagnosis of influenza from Nasopharyngeal swab specimens and should not be used as a sole basis for treatment. Nasal washings and aspirates are unacceptable for Xpert Xpress SARS-CoV-2/FLU/RSV testing.  Fact Sheet for Patients: BloggerCourse.comhttps://www.fda.gov/media/152166/download  Fact Sheet for Healthcare Providers: SeriousBroker.ithttps://www.fda.gov/media/152162/download  This test is not yet approved or cleared by the [place] FDA and has been authorized for detection and/or diagnosis of SARS-CoV-2 by FDA under an Emergency Use Authorization (EUA). This EUA will remain in effect (meaning this test can be used) for the duration of the COVID-19 declaration under Section 564(b)(1) of the Act, 21 U.S.C. section 360bbb-3(b)(1), unless the authorization is terminated or revoked.  Performed at [facility], [address] [place], [place] [address]   Culture, blood (routine x 2)     Status: None (Preliminary result)   Collection Time: 09/28/20  8:49 PM   Specimen: BLOOD  Result Value Ref Range Status   Specimen Description   Final    BLOOD RIGHT HAND Performed at [facility], [address] [place], [place] [address]     Special Requests   Final    BOTTLES DRAWN AEROBIC ONLY Blood Culture adequate volume Performed at [facility], [address] [place], [place] [address]    Culture   Final    NO GROWTH 4 DAYS Performed at [facility], [address] [place], [place] [address]    Report Status PENDING  Incomplete  Culture, blood (routine x 2)     Status: None (Preliminary result)   Collection Time: 09/28/20  8:49 PM   Specimen: BLOOD  Result Value Ref Range Status   Specimen Description   Final    BLOOD LEFT WRIST Performed at [facility], [address] [place], [place] [address]    Special Requests   Final    BOTTLES DRAWN AEROBIC AND ANAEROBIC Blood Culture adequate volume Performed at [facility], [address] [place], [place] [address]    Culture   Final    NO GROWTH 4 DAYS Performed at [facility], [address] [place], [place] [address]    Report Status PENDING  Incomplete  MRSA PCR Screening     Status: None   Collection Time: 09/29/20  7:17 PM  Result Value Ref Range Status   MRSA by PCR NEGATIVE NEGATIVE Final    Comment:        The GeneXpert MRSA Assay (FDA approved for NASAL specimens only), is one component of a comprehensive MRSA colonization surveillance program. It is not intended to diagnose MRSA infection nor to guide or monitor treatment for MRSA infections. Performed at [facility], [address] [place], [place] [address]     Radiology Reports DG CHEST PORT 1 VIEW  Result Date: 09/29/2020 CLINICAL DATA:  Fever EXAM: PORTABLE CHEST 1 VIEW COMPARISON:  08/08/2009 FINDINGS: Lungs volumes are small, but are symmetric and are clear. No pneumothorax or pleural effusion. Cardiac size within normal limits. Pulmonary vascularity is normal. Osseous structures are age-appropriate. No acute bone abnormality. IMPRESSION: No active disease. Electronically Signed   By: Helyn NumbersAshesh   Parikh MD   On: 09/29/2020 05:20    Lab Data:  CBC: Recent Labs  Lab 09/26/20 2254 09/28/20 0245 09/29/20 1527  WBC 7.2 7.5 5.2  HGB 14.0 13.2 13.2  HCT 40.9 39.8 39.7  MCV 92.3 96.6 96.6  PLT 73* 64* 74*   Basic Metabolic Panel: Recent Labs  Lab 09/28/20 0245 09/29/20 0330 09/29/20 1527 09/30/20 0237 10/02/20 0346 10/03/20 0423  NA 136 136 134* 135 135 134*  K 3.8 3.2* 3.5 3.9 3.8 3.8  CL 107 108 106 107 104 101  CO2 20* 22 21* 21* 24 25  GLUCOSE 126* 131* 106* 103* 98 103*  BUN 10 8 11 9 14 11   CREATININE 0.51* 0.40* 0.43* 0.32* 0.53* 0.47*  CALCIUM 9.0 8.6* 8.4* 8.8* 9.1 8.8*  MG 1.6* 1.7 1.9 1.9 2.1  --   PHOS  --  2.5  --   --   --   --    GFR: Estimated Creatinine Clearance: 111.9 mL/min (A) (by C-G formula based on SCr of 0.47 mg/dL (L)). Liver Function Tests: Recent Labs  Lab 09/26/20 2254 09/28/20 0245 09/29/20 0330  AST 101* 95* 47*  ALT 89* 86* 63*  ALKPHOS 100 84 81  BILITOT 1.4* 1.7* 1.8*  PROT 9.0* 7.3 7.2  ALBUMIN 4.8 3.8 3.6   No results for input(s): LIPASE, AMYLASE in the last 168 hours. No results for input(s): AMMONIA in the last 168 hours. Coagulation Profile: No results for input(s): INR, PROTIME in the last 168 hours. Cardiac Enzymes: No results for input(s): CKTOTAL, CKMB, CKMBINDEX, TROPONINI in the last 168 hours. BNP (last 3 results) No results for input(s): PROBNP in the last 8760 hours. HbA1C: No results for input(s): HGBA1C in the last 72 hours.  CBG: Recent Labs  Lab 10/02/20 1117 10/02/20 1729 10/02/20 2352 10/03/20 0606 10/03/20 1120  GLUCAP 110* 120* 116* 134* 105*   Lipid Profile: No results for input(s): CHOL, HDL, LDLCALC, TRIG, CHOLHDL, LDLDIRECT in the last 72 hours. Thyroid Function Tests: No results for input(s): TSH, T4TOTAL, FREET4, T3FREE, THYROIDAB in the last 72 hours. Anemia Panel: No results for input(s): VITAMINB12, FOLATE, FERRITIN, TIBC, IRON, RETICCTPCT in the last 72 hours. Urine  analysis:    Component Value Date/Time   COLORURINE STRAW (A) 11/12/2019 1227   APPEARANCEUR CLEAR 11/12/2019 1227   LABSPEC 1.002 (L) 11/12/2019 1227   PHURINE 6.0 11/12/2019 1227   GLUCOSEU NEGATIVE 11/12/2019 1227   HGBUR NEGATIVE 11/12/2019 1227   BILIRUBINUR NEGATIVE 11/12/2019 1227   KETONESUR NEGATIVE 11/12/2019 1227   PROTEINUR NEGATIVE 11/12/2019 1227   NITRITE NEGATIVE 11/12/2019 1227   LEUKOCYTESUR NEGATIVE 11/12/2019 1227     Riven Mabile M.D. [facility] 10/03/2020, 12:42 PM  Available via Epic secure chat 7am-7pm After 7 pm, please refer to night coverage provider listed on amion.

## 2020-10-03 NOTE — Consult Note (Signed)
[facility] Face-to-Face Psychiatry Consult progress note Patient Identification: [name] MRN:  [id number] Principal Diagnosis: DTs (delirium tremens) (HCC) Diagnosis:  Principal Problem:   DTs (delirium tremens) (HCC) Active Problems:   Hypokalemia   LFT elevation   Hyperbilirubinemia   Accelerated hypertension   Thrombocytopenia (HCC)   firing gun at home, suspected Homicidal behavior, IVCed.    Total Time spent with patient: 20 minutes  Subjective:   [name] is a 40 y.o. male patient admitted with alcohol abuse, DTs, psychosis, and homicidal ideations.   Pt is seen and examined today. Interpreter services (ID [phone], Vicky) were used due to language barrier. Pt states his mood is good . Pt rates his mood at 10/10 (10 is the best mood). Pt slept well last night. Pt states his appetite is ok, sometimes he feels hungry and other times he doesn't want to eat. Pt rates his anxiety at 0/10 (10 is the worst anxiety) Currently, Pt denies any suicidal ideation, homicidal ideation and, visual and auditory hallucination. He states sometimes he sees people who are actually not there. He doesn't remember when was the last time he saw someone but it wasn't last night. He states he heard voices in the past of people talking but denies any commands hallucinations. He states he had been using cocaine in the past. He denies using any other drugs.  Pt denies any headache, nausea, vomiting, dizziness, chest pain, SOB, abdominal pain, diarrhea, and constipation. Pt denies any medication side effects and has been tolerating it well. Pt denies any other concerns.  Talked to wife Lourdes  [phone]  via telephone interpreter services. Wife states if he is not hallucinating now then she feels safe for him to come home.  She states that she does not have any gun at her house.  She states she does not know from where Ahsan got that gun but it was taken away by the police.  She states  she still has bullets at her house but she will call the police to take them away.  Safety planning done with wife Lordes.  Told to remove all the dangerous objects from home.  She agreed with the plan.   Past Medical History: History reviewed. No pertinent past medical history. History reviewed. No pertinent surgical history. Family History: History reviewed. No pertinent family history. Family Psychiatric  History: Unknown  Social History:  Social History   Substance and Sexual Activity  Alcohol Use Yes   Comment: drinks 6 beers daily     Social History   Substance and Sexual Activity  Drug Use Yes   Types: Cocaine   Comment: states that he used cocaine on one occasion last night    Social History   Socioeconomic History   Marital status: Single    Spouse name: Not on file   Number of children: Not on file   Years of education: Not on file   Highest education level: Not on file  Occupational History   Not on file  Tobacco Use   Smoking status: Some Days    Pack years: 0.00   Smokeless tobacco: Never  Substance and Sexual Activity   Alcohol use: Yes    Comment: drinks 6 beers daily   Drug use: Yes    Types: Cocaine    Comment: states that he used cocaine on one occasion last night   Sexual activity: Not on file  Other Topics Concern   Not on file  Social History Narrative   Not  on file   Social Determinants of Health   Financial Resource Strain: Not on file  Food Insecurity: Not on file  Transportation Needs: Not on file  Physical Activity: Not on file  Stress: Not on file  Social Connections: Not on file   Additional Social History:    Allergies:   Allergies  Allergen Reactions   Penicillins Swelling    Per his wife: eyes swelled shut.    Labs:  Results for orders placed or performed during the hospital encounter of 09/26/20 (from the past 48 hour(s))  Glucose, capillary     Status: Abnormal   Collection Time: 10/01/20  7:08 PM  Result Value Ref  Range   Glucose-Capillary 104 (H) 70 - 99 mg/dL    Comment: Glucose reference range applies only to samples taken after fasting for at least 8 hours.  Glucose, capillary     Status: Abnormal   Collection Time: 10/02/20  3:27 AM  Result Value Ref Range   Glucose-Capillary 113 (H) 70 - 99 mg/dL    Comment: Glucose reference range applies only to samples taken after fasting for at least 8 hours.  Basic metabolic panel     Status: Abnormal   Collection Time: 10/02/20  3:46 AM  Result Value Ref Range   Sodium 135 135 - 145 mmol/L   Potassium 3.8 3.5 - 5.1 mmol/L   Chloride 104 98 - 111 mmol/L   CO2 24 22 - 32 mmol/L   Glucose, Bld 98 70 - 99 mg/dL    Comment: Glucose reference range applies only to samples taken after fasting for at least 8 hours.   BUN 14 6 - 20 mg/dL   Creatinine, Ser 8.41 (L) 0.61 - 1.24 mg/dL   Calcium 9.1 8.9 - 32.4 mg/dL   GFR, Estimated >40 >10 mL/min    Comment: (NOTE) Calculated using the CKD-EPI Creatinine Equation (2021)    Anion gap 7 5 - 15    Comment: Performed at [facility], [address], [place], [place] [address]  Magnesium     Status: None   Collection Time: 10/02/20  3:46 AM  Result Value Ref Range   Magnesium 2.1 1.7 - 2.4 mg/dL    Comment: Performed at [facility], [address], [place], [place] [address]  Glucose, capillary     Status: None   Collection Time: 10/02/20  7:00 AM  Result Value Ref Range   Glucose-Capillary 95 70 - 99 mg/dL    Comment: Glucose reference range applies only to samples taken after fasting for at least 8 hours.  Glucose, capillary     Status: Abnormal   Collection Time: 10/02/20 11:17 AM  Result Value Ref Range   Glucose-Capillary 110 (H) 70 - 99 mg/dL    Comment: Glucose reference range applies only to samples taken after fasting for at least 8 hours.  Glucose, capillary     Status: Abnormal   Collection Time: 10/02/20  5:29 PM  Result Value Ref Range    Glucose-Capillary 120 (H) 70 - 99 mg/dL    Comment: Glucose reference range applies only to samples taken after fasting for at least 8 hours.  Glucose, capillary     Status: Abnormal   Collection Time: 10/02/20 11:52 PM  Result Value Ref Range   Glucose-Capillary 116 (H) 70 - 99 mg/dL    Comment: Glucose reference range applies only to samples taken after fasting for at least 8 hours.  Basic metabolic panel  Status: Abnormal   Collection Time: 10/03/20  4:23 AM  Result Value Ref Range   Sodium 134 (L) 135 - 145 mmol/L   Potassium 3.8 3.5 - 5.1 mmol/L   Chloride 101 98 - 111 mmol/L   CO2 25 22 - 32 mmol/L   Glucose, Bld 103 (H) 70 - 99 mg/dL    Comment: Glucose reference range applies only to samples taken after fasting for at least 8 hours.   BUN 11 6 - 20 mg/dL   Creatinine, Ser 1.610.47 (L) 0.61 - 1.24 mg/dL   Calcium 8.8 (L) 8.9 - 10.3 mg/dL   GFR, Estimated >09>60 >60>60 mL/min    Comment: (NOTE) Calculated using the CKD-EPI Creatinine Equation (2021)    Anion gap 8 5 - 15    Comment: Performed at [facility], [address] [place], [place] [address]  Glucose, capillary     Status: Abnormal   Collection Time: 10/03/20  6:06 AM  Result Value Ref Range   Glucose-Capillary 134 (H) 70 - 99 mg/dL    Comment: Glucose reference range applies only to samples taken after fasting for at least 8 hours.  Glucose, capillary     Status: Abnormal   Collection Time: 10/03/20 11:20 AM  Result Value Ref Range   Glucose-Capillary 105 (H) 70 - 99 mg/dL    Comment: Glucose reference range applies only to samples taken after fasting for at least 8 hours.    Current Facility-Administered Medications  Medication Dose Route Frequency Provider Last Rate Last Admin   acetaminophen (TYLENOL) tablet 650 mg  650 mg Oral Q6H PRN Rolly SalterPatel, Pranav M, MD   650 mg at 09/30/20 2136   Or   acetaminophen (TYLENOL) suppository 650 mg  650 mg Rectal Q6H PRN Rolly SalterPatel, Pranav M, MD       amLODipine  (NORVASC) tablet 10 mg  10 mg Oral Daily Lorin GlassSmith, Daniel C, MD   10 mg at 10/03/20 0931   bisacodyl (DULCOLAX) suppository 10 mg  10 mg Rectal Daily PRN Rolly SalterPatel, Pranav M, MD       carvedilol (COREG) tablet 6.25 mg  6.25 mg Oral BID WC Lorin GlassSmith, Daniel C, MD   6.25 mg at 10/03/20 0931   folic acid (FOLVITE) tablet 1 mg  1 mg Oral Daily Len ChildsBell, Michelle T, RPH   1 mg at 10/03/20 0931   guaiFENesin-dextromethorphan (ROBITUSSIN DM) 100-10 MG/5ML syrup 5 mL  5 mL Oral Q4H PRN Lorin GlassSmith, Daniel C, MD   5 mL at 10/02/20 1928   labetalol (NORMODYNE) injection 5 mg  5 mg Intravenous Q2H PRN Rolly SalterPatel, Pranav M, MD   5 mg at 09/30/20 0434   multivitamin with minerals tablet 1 tablet  1 tablet Oral Daily Rolly SalterPatel, Pranav M, MD   1 tablet at 10/03/20 0931   ondansetron (ZOFRAN) tablet 4 mg  4 mg Oral Q6H PRN Rolly SalterPatel, Pranav M, MD       Or   ondansetron [facility](ZOFRAN) injection 4 mg  4 mg Intravenous Q6H PRN Rolly SalterPatel, Pranav M, MD       PHENobarbital (LUMINAL) tablet 32.4 mg  32.4 mg Oral TID Lorin GlassSmith, Daniel C, MD   32.4 mg at 10/03/20 0931   QUEtiapine (SEROQUEL) tablet 25 mg  25 mg Oral BID Maryagnes AmosStarkes-Perry, Takia S, FNP   25 mg at 10/03/20 0931   thiamine tablet 100 mg  100 mg Oral Daily Rolly SalterPatel, Pranav M, MD   100 mg at 10/03/20 [id number]    Musculoskeletal: Strength & Muscle  Tone: within normal limits Gait & Station: normal Patient leans: N/A    Psychiatric Specialty Exam: Physical Exam Vitals and nursing note reviewed.  Constitutional:      General: He is not in acute distress.    Appearance: Normal appearance. He is normal weight. He is not ill-appearing, toxic-appearing or diaphoretic.  HENT:     Head: Normocephalic and atraumatic.  Pulmonary:     Effort: Pulmonary effort is normal.  Musculoskeletal:        General: Normal range of motion.  Neurological:     General: No focal deficit present.     Mental Status: He is alert and oriented to person, place, and time.  Psychiatric:        Mood and Affect: Mood normal.         Behavior: Behavior normal.    Review of Systems  Constitutional:  Negative for chills and fever.  Eyes:  Negative for blurred vision.  Respiratory:  Negative for cough and wheezing.   Cardiovascular:  Negative for chest pain.  Gastrointestinal:  Negative for nausea and vomiting.  Musculoskeletal:  Negative for myalgias.  Skin:  Negative for rash.  Neurological:  Negative for dizziness, focal weakness and headaches.  Psychiatric/Behavioral:  Negative for depression, hallucinations, memory loss and suicidal ideas. The patient is not nervous/anxious and does not have insomnia.    Blood pressure 117/71, pulse (!) 59, temperature 98.5 F (36.9 C), temperature source Oral, resp. rate 16, height 5\' 6"  (1.676 m), weight 76.4 kg, SpO2 99 %.Body mass index is 27.2 kg/m.  General Appearance: Casual  Eye Contact:  Fair  Speech:  Clear and Coherent  Volume:  Normal  Mood:  Euthymic  Affect:  Appropriate  Thought Process:  Goal Directed and Linear  Orientation:  Full (Time, Place, and Person)  Thought Content:  Logical  Suicidal Thoughts:  No  Homicidal Thoughts:  No  Memory:  Immediate;   Good Recent;   Good  Judgement:  Fair  Insight:  Good  Psychomotor Activity:  Normal  Concentration:  Concentration: Good  Recall:  Good  Fund of Knowledge:  Fair  Language:  Good  Akathisia:  No  Handed:  Right  AIMS (if indicated):     Assets:  Communication Skills Desire for Improvement Housing Physical Health Social Support  ADL's:  Intact  Cognition:  WNL  Sleep:   good    Physical Exam: Physical Exam Vitals and nursing note reviewed.  Constitutional:      General: He is not in acute distress.    Appearance: Normal appearance. He is normal weight. He is not ill-appearing, toxic-appearing or diaphoretic.  HENT:     Head: Normocephalic and atraumatic.  Pulmonary:     Effort: Pulmonary effort is normal.  Musculoskeletal:        General: Normal range of motion.  Neurological:      General: No focal deficit present.     Mental Status: He is alert and oriented to person, place, and time.  Psychiatric:        Mood and Affect: Mood normal.        Behavior: Behavior normal.   Review of Systems  Constitutional:  Negative for chills and fever.  Eyes:  Negative for blurred vision.  Respiratory:  Negative for cough and wheezing.   Cardiovascular:  Negative for chest pain.  Gastrointestinal:  Negative for nausea and vomiting.  Musculoskeletal:  Negative for myalgias.  Skin:  Negative for rash.  Neurological:  Negative for  dizziness, focal weakness and headaches.  Psychiatric/Behavioral:  Negative for depression, hallucinations, memory loss and suicidal ideas. The patient is not nervous/anxious and does not have insomnia.   Blood pressure 117/71, pulse (!) 59, temperature 98.5 F (36.9 C), temperature source Oral, resp. rate 16, height 5\' 6"  (1.676 m), weight 76.4 kg, SpO2 99 %. Body mass index is 27.2 kg/m.  Treatment Plan Summary: Patient is not suicidal, homicidal, or psychotic.  Patient does not meet criteria for inpatient hospitalization.  Patient is psychiatrically cleared. Safety planning done with wife [place].  Wife feels safe for him to come home.  Wife states that police took the gun away and there is no gun at home. -Patient does not meet criteria for inpatient hospitalization.  Patient is psychiatrically cleared.  -Recommended Psychiatry medication management on outpatient basis. -Will recommend continuing Seroquel 25 mg p.o. twice daily. -Continue thiamine, multivitamin, folic acid. -Recommend follow-up with psychiatry on outpatient basis  -Patient can follow-up at [facility].  Walk-in service.  [facility] -Walk-in service information Please come to [facility] (this facility) during walk in hours for appointment with psychiatrist for further medication management and for therapists for therapy.   Walk in hours are 8-11 AM Monday through  Thursday for medication management.Child and adolescent psychiatrists are only available on Wednesdays and Thursdays during walk in hours.  Therapy walk in hours are Monday-Wednesday 8 AM-1PM.   It is first come, first -serve; it is best to arrive by 7:00 AM.   On Friday from 1 pm to 4 pm for therapy intake only. Please arrive by 12:00 pm as it is  first come, first -serve.    When you arrive please go upstairs for your appointment. If you are unsure of where to go, inform the front desk that you are here for a walk in appointment and they will assist you with directions upstairs.  Address:  [address], in [place], [place] Ph: [phone]     Disposition: Patient does not meet criteria for psychiatric inpatient admission. Discussed crisis plan, support from social network, calling 911, coming to the Emergency Department, and calling Suicide Hotline. Safety planning done with wife Lordes.  Recommended psychiatry Outpatient follow up.  Psychiatry will sign off at this time.   [phone], MD 10/03/2020 1:43 PM

## 2020-10-03 NOTE — Plan of Care (Signed)

## 2020-10-03 NOTE — TOC Progression Note (Signed)
Transition of Care [facility]) - Progression Note    Patient Details  Name: [name] MRN: [id number] Date of Birth: 1980-06-19  Transition of Care [facility] CM/SW Contact  Geni Bers, RN Phone Number: 10/03/2020, 12:23 PM  Clinical Narrative:    Request for [facility] was started.    Expected Discharge Plan: Psychiatric Hospital Barriers to Discharge: Continued Medical Work up  Expected Discharge Plan and Services Expected Discharge Plan: Psychiatric Hospital   Discharge Planning Services: CM Consult   Living arrangements for the past 2 months: Single Family Home                                       Social Determinants of Health (SDOH) Interventions    Readmission Risk Interventions No flowsheet data found.

## 2020-10-03 NOTE — Plan of Care (Signed)
  Problem: Nutrition: Goal: Adequate nutrition will be maintained Outcome: Completed/Met

## 2020-10-04 LAB — COMPREHENSIVE METABOLIC PANEL
ALT: 142 U/L — ABNORMAL HIGH (ref 0–44)
AST: 116 U/L — ABNORMAL HIGH (ref 15–41)
Albumin: 3.9 g/dL (ref 3.5–5.0)
Alkaline Phosphatase: 101 U/L (ref 38–126)
Anion gap: 9 (ref 5–15)
BUN: 10 mg/dL (ref 6–20)
CO2: 25 mmol/L (ref 22–32)
Calcium: 9.1 mg/dL (ref 8.9–10.3)
Chloride: 100 mmol/L (ref 98–111)
Creatinine, Ser: 0.48 mg/dL — ABNORMAL LOW (ref 0.61–1.24)
GFR, Estimated: >60 mL/min (ref >60)
Glucose, Bld: 96 mg/dL (ref 70–99)
Potassium: 3.7 mmol/L (ref 3.5–5.1)
Sodium: 134 mmol/L — ABNORMAL LOW (ref 135–145)
Total Bilirubin: 1.2 mg/dL (ref 0.3–1.2)
Total Protein: 7.9 g/dL (ref 6.5–8.1)

## 2020-10-04 LAB — CULTURE, BLOOD (ROUTINE X 2)
Culture: NO GROWTH
Culture: NO GROWTH
Special Requests: ADEQUATE
Special Requests: ADEQUATE

## 2020-10-04 LAB — CBC
HCT: 42.7 % (ref 39.0–52.0)
Hemoglobin: 14.1 g/dL (ref 13.0–17.0)
MCH: 31.9 pg (ref 26.0–34.0)
MCHC: 33 g/dL (ref 30.0–36.0)
MCV: 96.6 fL (ref 80.0–100.0)
Platelets: 184 10*3/uL (ref 150–400)
RBC: 4.42 MIL/uL (ref 4.22–5.81)
RDW: 13.4 % (ref 11.5–15.5)
WBC: 7.3 10*3/uL (ref 4.0–10.5)
nRBC: 0 % (ref 0.0–0.2)

## 2020-10-04 LAB — GLUCOSE, CAPILLARY: Glucose-Capillary: 128 mg/dL — ABNORMAL HIGH (ref 70–99)

## 2020-10-04 MED ORDER — THIAMINE HCL 100 MG PO TABS: 100.0000 mg | ORAL_TABLET | Freq: Every day | ORAL | 1 refills | Status: DC

## 2020-10-04 MED ORDER — QUETIAPINE FUMARATE 25 MG PO TABS: 25.0000 mg | ORAL_TABLET | Freq: Two times a day (BID) | ORAL | 0 refills | Status: DC

## 2020-10-04 MED ORDER — AMLODIPINE BESYLATE 10 MG PO TABS: 10.0000 mg | ORAL_TABLET | Freq: Every day | ORAL | 3 refills | Status: DC

## 2020-10-04 MED ORDER — CARVEDILOL 6.25 MG PO TABS
6.2500 mg | ORAL_TABLET | Freq: Two times a day (BID) | ORAL | 3 refills | Status: DC
Start: 2020-10-04 — End: 2021-01-06

## 2020-10-04 MED ORDER — FOLIC ACID 1 MG PO TABS: 1.0000 mg | ORAL_TABLET | Freq: Every day | ORAL | 1 refills | Status: DC

## 2020-10-04 NOTE — Progress Notes (Signed)
PT Cancellation Note/ Screen  Patient Details Name: [name] MRN: [id number] DOB: 31-Jan-1981   Cancelled Treatment:    Reason Eval/Treat Not Completed: PT screened, no needs identified, will sign off Per RN, pt independent in room and to d/c today.  No skilled acute PT needs identified by RN.  PT to sign off.   Con Arganbright,KATHrine E 10/04/2020, 10:31 AM Thomasene Mohair PT, DPT Acute Rehabilitation Services Pager: [phone] Office: [phone]

## 2020-10-04 NOTE — Plan of Care (Signed)
Discharge instructions reviewed with patient and spouse, utilized spanish interpreter to review all medications, what they were for and when next dose was due.  Instructions also printed out in [place] and [place].  Patient verbalized understanding of all instructions.  Ambulatory to main entrance to be taken home by wife.

## 2020-10-04 NOTE — Discharge Summary (Signed)
Physician Discharge Summary   Patient ID: [name] MRN: [id number] DOB/AGE: 131/30/1982 40 y.o.  Admit date: 09/26/2020 Discharge date: 10/04/2020  Primary Care Physician: Patient to follow-up with behavioral health center outpatient   Recommendations for Outpatient Follow-up:  Follow up with PCP in 1-2 weeks  Home Health: None  Equipment/Devices:   Discharge Condition: stable  CODE STATUS: FULL  Diet recommendation: Heart healthy diet   Discharge Diagnoses:     DTs (delirium tremens) (HCC) with acute alcohol withdrawals History of alcohol abuse  Hypokalemia, hypomagnesemia  LFT elevation  Mild transaminitis with hyperbilirubinemia  Accelerated hypertension  Thrombocytopenia (HCC)   Consults: Psychiatry CCM    Allergies:   Allergies  Allergen Reactions   Penicillins Swelling    Per his wife: eyes swelled shut.     DISCHARGE MEDICATIONS: Allergies as of 10/04/2020       Reactions   Penicillins Swelling   Per his wife: eyes swelled shut.        Medication List     STOP taking these medications    hydrOXYzine 25 MG tablet Commonly known as: ATARAX/VISTARIL   lactulose 10 GM/15ML solution Commonly known as: CHRONULAC       TAKE these medications    amLODipine 10 MG tablet Commonly known as: NORVASC Take 1 tablet (10 mg total) by mouth daily. What changed:  medication strength how much to take Notes to patient: 10/05/2020    carvedilol 6.25 MG tablet Commonly known as: COREG Take 1 tablet (6.25 mg total) by mouth 2 (two) times daily with a meal. Notes to patient: 10/04/2020 evening dose    folic acid 1 MG tablet Commonly known as: FOLVITE Take 1 tablet (1 mg total) by mouth daily. Start taking on: October 05, 2020 Notes to patient: 10/05/2020    ibuprofen 200 MG tablet Commonly known as: ADVIL Take 200-800 mg by mouth every 6 (six) hours as needed for mild pain.   QUEtiapine 25 MG tablet Commonly known as:  SEROQUEL Take 1 tablet (25 mg total) by mouth 2 (two) times daily. Notes to patient: 10/04/2020 bedtime dose    thiamine 100 MG tablet Take 1 tablet (100 mg total) by mouth daily. Start taking on: October 05, 2020 Notes to patient: 10/05/2020          Brief H and P: For complete details please refer to admission H and P, but in brief Patient is a 40 year old male with history of alcohol abuse, DTs, multiple IVCs in the past, psychosis, alcoholic hepatitis, hypertension was brought in by [employer] is IVC.  Patient was firing gun in his house with the kids in the house, was hallucinating and seeing people that were not there.  Alcohol level was 94, labs showed potassium 2.9. Patient had refractory agitation and DTs and required Ativan along with Haldol and Valium, multiple providers and law enforcement to hold him down.  CCM was consulted for Precedex, currently off. [facility]    Hospital Course:   DTs (delirium tremens) (HCC) and acute withdrawals with underlying history of alcohol abuse-present on admission -Patient was admitted and placed on Ativan with CIWA protocol however patient continued to worsen with  delirium tremens.  He was transferred to ICU and placed on Precedex drip under the care of critical care. -He has been off the Precedex and was transferred out to the floor on 7/2.  Patient was on phenobarbital taper which he has completed on 7/4 evening -Patient was  intermittently admitted at the time of admission, rescinded it today at the time of discharge Psychiatry was consulted, followed up at the time of discharge and felt that patient does not meet criteria for inpatient psychiatric hospitalization at this time.  He was cleared by psychiatry to be discharged home.  Recommended to continue Seroquel 25 mg twice a day and outpatient follow-up with the [facility] walk-in service.    Hypokalemia, hypomagnesemia -Resolved   Mild transaminitis -Likely  due to alcoholic hepatitis, outpatient follow-up of LFTs in 2 weeks   Essential hypertension -BP stable, continue Coreg, amlodipine   Firing gun at home, suspected Homicidal behavior, IVCed.,  Acute psychosis, DTs -Continue Seroquel 25 mg p.o. twice daily.  Patient was cleared by psychiatry to be discharged home.  Day of Discharge S: Currently no acute complaints.  No pain.  Tremors are improving.  BP (!) 144/91 (BP Location: Left Arm)   Pulse 65   Temp 98.6 F (37 C)   Resp 20   Ht 5\' 6"  (1.676 m)   Wt 76.4 kg   SpO2 99%   BMI 27.20 kg/m   Physical Exam: General: Alert and awake oriented x3 not in any acute distress. CVS: S1-S2 clear no murmur rubs or gallops Chest: clear to auscultation bilaterally, no wheezing rales or rhonchi Abdomen: soft nontender, nondistended, normal bowel sounds Extremities: no cyanosis, clubbing or edema noted bilaterally Neuro: no new neurodeficits    Get Medicines reviewed and adjusted: Please take all your medications with you for your next visit with your Primary MD  Please request your Primary MD to go over all hospital tests and procedure/radiological results at the follow up. Please ask your Primary MD to get all Hospital records sent to his/her office.  If you experience worsening of your admission symptoms, develop shortness of breath, life threatening emergency, suicidal or homicidal thoughts you must seek medical attention immediately by calling 911 or calling your MD immediately  if symptoms less severe.  You must read complete instructions/literature along with all the possible adverse reactions/side effects for all the Medicines you take and that have been prescribed to you. Take any new Medicines after you have completely understood and accept all the possible adverse reactions/side effects.   Do not drive when taking pain medications.   Do not take more than prescribed Pain, Sleep and Anxiety Medications  Special Instructions: If  you have smoked or chewed Tobacco  in the last 2 yrs please stop smoking, stop any regular Alcohol  and or any Recreational drug use.  Wear Seat belts while driving.  Please note  You were cared for by a hospitalist during your hospital stay. Once you are discharged, your primary care physician will handle any further medical issues. Please note that NO REFILLS for any discharge medications will be authorized once you are discharged, as it is imperative that you return to your primary care physician (or establish a relationship with a primary care physician if you do not have one) for your aftercare needs so that they can reassess your need for medications and monitor your lab values.   The results of significant diagnostics from this hospitalization (including imaging, microbiology, ancillary and laboratory) are listed below for reference.      Procedures/Studies:  DG CHEST PORT 1 VIEW  Result Date: 09/29/2020 CLINICAL DATA:  Fever EXAM: PORTABLE CHEST 1 VIEW COMPARISON:  08/08/2009 FINDINGS: Lungs volumes are small, but are symmetric and are clear. No pneumothorax or pleural effusion. Cardiac size within normal  limits. Pulmonary vascularity is normal. Osseous structures are age-appropriate. No acute bone abnormality. IMPRESSION: No active disease. Electronically Signed   By: Helyn Numbers MD   On: 09/29/2020 05:20      LAB RESULTS: Basic Metabolic Panel: Recent Labs  Lab 09/29/20 0330 09/29/20 1527 10/02/20 0346 10/03/20 0423 10/04/20 0347  NA 136   < > 135 134* 134*  K 3.2*   < > 3.8 3.8 3.7  CL 108   < > 104 101 100  CO2 22   < > 24 25 25   GLUCOSE 131*   < > 98 103* 96  BUN 8   < > 14 11 10   CREATININE 0.40*   < > 0.53* 0.47* 0.48*  CALCIUM 8.6*   < > 9.1 8.8* 9.1  MG 1.7   < > 2.1  --   --   PHOS 2.5  --   --   --   --    < > = values in this interval not displayed.   Liver Function Tests: Recent Labs  Lab 09/29/20 0330 10/04/20 0347  AST 47* 116*  ALT 63* 142*   ALKPHOS 81 101  BILITOT 1.8* 1.2  PROT 7.2 7.9  ALBUMIN 3.6 3.9   No results for input(s): LIPASE, AMYLASE in the last 168 hours. No results for input(s): AMMONIA in the last 168 hours. CBC: Recent Labs  Lab 09/29/20 1527 10/04/20 0347  WBC 5.2 7.3  HGB 13.2 14.1  HCT 39.7 42.7  MCV 96.6 96.6  PLT 74* 184   Cardiac Enzymes: No results for input(s): CKTOTAL, CKMB, CKMBINDEX, TROPONINI in the last 168 hours. BNP: Invalid input(s): POCBNP CBG: Recent Labs  Lab 10/03/20 2322 10/04/20 0508  GLUCAP 119* 128*       Disposition and Follow-up: Discharge Instructions     Diet - low sodium heart healthy   Complete by: As directed    Discharge instructions   Complete by: As directed    [facility] -Walk-in service information Please come to [facility] (this facility) during walk in hours for appointment with psychiatrist for further medication management and for therapists for therapy.   Walk in hours are 8-11 AM Monday through Thursday for medication management.Child and adolescent psychiatrists are only available on Wednesdays and Thursdays during walk in hours. Therapy walk in hours are Monday-Wednesday 8 AM-1PM.   It is first come, first -serve; it is best to arrive by 7:00 AM.   On Friday from 1 pm to 4 pm for therapy intake only. Please arrive by 12:00 pm as it is  first come, first -serve.     When you arrive please go upstairs for your appointment. If you are unsure of where to go, inform the front desk that you are here for a walk in appointment and they will assist you with directions upstairs.   Address: [address] in [place], [address] Ph: [phone]) [phone]   Increase activity slowly   Complete by: As directed         DISPOSITION: Home   DISCHARGE FOLLOW-UP  Follow-up Information     [facility] Follow up.   Specialty: Urgent Care Why: Walk in hours are 8-11 AM Monday through Thursday for  medication management. Therapy walk in hours are Monday-Wednesday 8 AM-1PM.   It is first come, first -serve; it is best to arrive by 7:00 AM.   On Friday from 1 pm to 4 pm for therapy intake only.  Please arrive by 12:00 pm as it is first come, first -serve. Contact information: [phone] [place] [address] [phone]                 Time coordinating discharge:  35 minutes  Signed:   Thad Ranger M.D. [facility] 10/04/2020, 2:03 PM

## 2020-10-04 NOTE — TOC Progression Note (Signed)
Transition of Care [facility]) - Progression Note    Patient Details  Name: [name] MRN: [id number] Date of Birth: 1981/02/11  Transition of Care [facility]) CM/SW Contact  Geni Bers, RN Phone Number: 10/04/2020, 11:17 AM  Clinical Narrative:    IVC was Rescinded. Faxed to [employer]    Expected Discharge Plan: Psychiatric Hospital Barriers to Discharge: Continued Medical Work up  Expected Discharge Plan and Services Expected Discharge Plan: Psychiatric Hospital   Discharge Planning Services: CM Consult   Living arrangements for the past 2 months: Single Family Home Expected Discharge Date: 10/04/20                                     Social Determinants of Health (SDOH) Interventions    Readmission Risk Interventions No flowsheet data found.

## 2020-12-28 ENCOUNTER — Emergency Department (HOSPITAL_COMMUNITY)
Admission: EM | Admit: 2020-12-28 | Discharge: 2020-12-28 | Disposition: A | Discharge status: Home / Self Care | Payer: Self-pay | Attending: Emergency Medicine | Admitting: Emergency Medicine

## 2020-12-28 ENCOUNTER — Other Ambulatory Visit: Payer: Self-pay

## 2020-12-28 ENCOUNTER — Emergency Department (HOSPITAL_COMMUNITY): Payer: Self-pay

## 2020-12-28 DIAGNOSIS — Z79899 Other long term (current) drug therapy: Secondary | ICD-10-CM | POA: Insufficient documentation

## 2020-12-28 DIAGNOSIS — R1110 Vomiting, unspecified: Secondary | ICD-10-CM | POA: Insufficient documentation

## 2020-12-28 DIAGNOSIS — I10 Essential (primary) hypertension: Secondary | ICD-10-CM | POA: Insufficient documentation

## 2020-12-28 DIAGNOSIS — F17200 Nicotine dependence, unspecified, uncomplicated: Secondary | ICD-10-CM | POA: Insufficient documentation

## 2020-12-28 DIAGNOSIS — D696 Thrombocytopenia, unspecified: Secondary | ICD-10-CM | POA: Insufficient documentation

## 2020-12-28 LAB — CBC WITH DIFFERENTIAL/PLATELET
Abs Immature Granulocytes: 0.01 10*3/uL (ref 0.00–0.07)
Basophils Absolute: 0.1 10*3/uL (ref 0.0–0.1)
Basophils Relative: 2 %
Eosinophils Absolute: 0 10*3/uL (ref 0.0–0.5)
Eosinophils Relative: 1 %
HCT: 35.6 % — ABNORMAL LOW (ref 39.0–52.0)
Hemoglobin: 12 g/dL — ABNORMAL LOW (ref 13.0–17.0)
Immature Granulocytes: 0 %
Lymphocytes Relative: 10 %
Lymphs Abs: 0.3 10*3/uL — ABNORMAL LOW (ref 0.7–4.0)
MCH: 32.4 pg (ref 26.0–34.0)
MCHC: 33.7 g/dL (ref 30.0–36.0)
MCV: 96.2 fL (ref 80.0–100.0)
Monocytes Absolute: 0.4 10*3/uL (ref 0.1–1.0)
Monocytes Relative: 13 %
Neutro Abs: 2.5 10*3/uL (ref 1.7–7.7)
Neutrophils Relative %: 74 %
Platelets: 32 10*3/uL — ABNORMAL LOW (ref 150–400)
RBC: 3.7 MIL/uL — ABNORMAL LOW (ref 4.22–5.81)
RDW: 13.4 % (ref 11.5–15.5)
WBC: 3.3 10*3/uL — ABNORMAL LOW (ref 4.0–10.5)
nRBC: 0 % (ref 0.0–0.2)

## 2020-12-28 LAB — TYPE AND SCREEN
ABO/RH(D): A POS
Antibody Screen: NEGATIVE

## 2020-12-28 LAB — COMPREHENSIVE METABOLIC PANEL
ALT: 71 U/L — ABNORMAL HIGH (ref 0–44)
AST: 99 U/L — ABNORMAL HIGH (ref 15–41)
Albumin: 4.1 g/dL (ref 3.5–5.0)
Alkaline Phosphatase: 118 U/L (ref 38–126)
Anion gap: 9 (ref 5–15)
BUN: 7 mg/dL (ref 6–20)
CO2: 28 mmol/L (ref 22–32)
Calcium: 9.2 mg/dL (ref 8.9–10.3)
Chloride: 99 mmol/L (ref 98–111)
Creatinine, Ser: 0.41 mg/dL — ABNORMAL LOW (ref 0.61–1.24)
GFR, Estimated: >60 mL/min (ref >60)
Glucose, Bld: 115 mg/dL — ABNORMAL HIGH (ref 70–99)
Potassium: 4 mmol/L (ref 3.5–5.1)
Sodium: 136 mmol/L (ref 135–145)
Total Bilirubin: 1.9 mg/dL — ABNORMAL HIGH (ref 0.3–1.2)
Total Protein: 8.4 g/dL — ABNORMAL HIGH (ref 6.5–8.1)

## 2020-12-28 LAB — PROTIME-INR
INR: 1.1 (ref 0.8–1.2)
Prothrombin Time: 14.3 seconds (ref 11.4–15.2)

## 2020-12-28 MED ORDER — IOHEXOL 350 MG/ML SOLN
80.0000 mL | Freq: Once | INTRAVENOUS | Status: AC | PRN
  Administered 2020-12-28: 80 mL via INTRAVENOUS

## 2020-12-28 MED ORDER — LORAZEPAM 2 MG/ML IJ SOLN
2.0000 mg | Freq: Once | INTRAMUSCULAR | Status: AC
  Administered 2020-12-28: 2 mg via INTRAVENOUS
  Filled 2020-12-28: qty 1

## 2020-12-28 NOTE — Discharge Instructions (Addendum)
Call your primary care doctor or specialist as discussed in the next 2-3 days.   Return immediately back to the ER if:  Your symptoms worsen within the next 12-24 hours. You develop new symptoms such as new fevers, persistent vomiting, new pain, shortness of breath, or new weakness or numbness, or if you have any other concerns.  

## 2020-12-28 NOTE — ED Provider Notes (Signed)
[facility]-EMERGENCY DEPT Provider Note   CSN: [id number] Arrival date & time: 12/28/20  1058     History Chief Complaint  Patient presents with   Abnormal Lab    Low platelet    [name] is a 40 y.o. male.  Patient is chronic alcoholic, sent in by his doctor for low platelet count.  Patient states his last drink was last night.  Otherwise no complaints of active bleeding.  No chest pain reported, no reports of fevers no cough.  Patient states he has intermittent vomiting but describes it as nonbloody.  No diarrhea reported.  No bloody stools reported.      No past medical history on file.  Patient Active Problem List   Diagnosis Date Noted   LFT elevation 09/27/2020   Hyperbilirubinemia 09/27/2020   Accelerated hypertension 09/27/2020   Thrombocytopenia (HCC) 09/27/2020   firing gun at home, suspected Homicidal behavior, IVCed.  09/27/2020   Hypokalemia    Alcohol withdrawal delirium (HCC) 11/13/2019   DTs (delirium tremens) (HCC) 11/13/2019    No past surgical history on file.     No family history on file.  Social History   Tobacco Use   Smoking status: Some Days   Smokeless tobacco: Never  Substance Use Topics   Alcohol use: Yes    Comment: drinks 6 beers daily   Drug use: Yes    Types: Cocaine    Comment: states that he used cocaine on one occasion last night    Home Medications Prior to Admission medications   Medication Sig Start Date End Date Taking? Authorizing Provider  cyclobenzaprine (FLEXERIL) 10 MG tablet Take 10 mg by mouth 2 (two) times daily as needed for muscle spasms.   Yes [provider]  indomethacin (INDOCIN) 50 MG capsule Take 50 mg by mouth 2 (two) times daily as needed for mild pain.   Yes [provider]  lisinopril (ZESTRIL) 10 MG tablet Take 10 mg by mouth daily.   Yes [provider]  amLODipine (NORVASC) 10 MG tablet Take 1 tablet (10 mg total) by mouth  daily. Patient not taking: Reported on 12/28/2020 10/04/20 02/01/21  Cathren Harsh, MD  carvedilol (COREG) 6.25 MG tablet Take 1 tablet (6.25 mg total) by mouth 2 (two) times daily with a meal. Patient not taking: Reported on 12/28/2020 10/04/20   Cathren Harsh, MD  folic acid (FOLVITE) 1 MG tablet Take 1 tablet (1 mg total) by mouth daily. Patient not taking: Reported on 12/28/2020 10/05/20   Rai, Delene Ruffini, MD  QUEtiapine (SEROQUEL) 25 MG tablet Take 1 tablet (25 mg total) by mouth 2 (two) times daily. Patient not taking: Reported on 12/28/2020 10/04/20   Cathren Harsh, MD  thiamine 100 MG tablet Take 1 tablet (100 mg total) by mouth daily. Patient not taking: Reported on 12/28/2020 10/05/20   Cathren Harsh, MD    Allergies    Penicillins  Review of Systems   Review of Systems  Constitutional:  Negative for fever.  HENT:  Negative for ear pain and sore throat.   Eyes:  Negative for pain.  Respiratory:  Negative for cough.   Cardiovascular:  Negative for chest pain.  Gastrointestinal:  Negative for blood in stool.  Genitourinary:  Negative for flank pain.  Musculoskeletal:  Negative for back pain.  Skin:  Negative for color change and rash.  Neurological:  Negative for syncope.  All other systems reviewed and are negative.  Physical Exam  Updated Vital Signs BP (!) 160/98   Pulse 66   Temp 98.6 F (37 C) (Oral)   Resp 16   SpO2 98%   Physical Exam Constitutional:      Appearance: He is well-developed.  HENT:     Head: Normocephalic.     Nose: Nose normal.  Eyes:     Extraocular Movements: Extraocular movements intact.  Cardiovascular:     Rate and Rhythm: Normal rate.  Pulmonary:     Effort: Pulmonary effort is normal.  Skin:    Coloration: Skin is not jaundiced.     Comments: No petechia noted.  Neurological:     Mental Status: He is alert. Mental status is at baseline.    ED Results / Procedures / Treatments   Labs (all labs ordered are listed, but only abnormal  results are displayed) Labs Reviewed  CBC WITH DIFFERENTIAL/PLATELET - Abnormal; Notable for the following components:      Result Value   WBC 3.3 (*)    RBC 3.70 (*)    Hemoglobin 12.0 (*)    HCT 35.6 (*)    Platelets 32 (*)    Lymphs Abs 0.3 (*)    All other components within normal limits  COMPREHENSIVE METABOLIC PANEL - Abnormal; Notable for the following components:   Glucose, Bld 115 (*)    Creatinine, Ser 0.41 (*)    Total Protein 8.4 (*)    AST 99 (*)    ALT 71 (*)    Total Bilirubin 1.9 (*)    All other components within normal limits  PROTIME-INR  TYPE AND SCREEN  ABO/RH    EKG None  Radiology CT Abdomen Pelvis W Contrast  Result Date: 12/28/2020 CLINICAL DATA:  Acute nonlocalized abdominal pain. EXAM: CT ABDOMEN AND PELVIS WITH CONTRAST TECHNIQUE: Multidetector CT imaging of the abdomen and pelvis was performed using the standard protocol following bolus administration of intravenous contrast. CONTRAST:  14mL OMNIPAQUE IOHEXOL 350 MG/ML SOLN COMPARISON:  None. FINDINGS: Lower chest: No acute abnormality. Hepatobiliary: Nodular hepatic contour. The hepatic parenchyma is diffusely hypodense compared to the splenic parenchyma consistent with fatty infiltration. No focal liver abnormality. Nonspecific gallbladder wall thickening which can be seen in the setting of chronic liver changes. No gallstones or pericholecystic fluid. No biliary dilatation. Pancreas: No focal lesion. Normal pancreatic contour. No surrounding inflammatory changes. No main pancreatic ductal dilatation. Spleen: The spleen is enlarged measuring up to 15 cm. No focal splenic lesion. Adrenals/Urinary Tract: No adrenal nodule bilaterally. Bilateral kidneys enhance symmetrically. Subcentimeter hypodensities are too small to characterize. No hydronephrosis. No hydroureter. The urinary bladder is unremarkable. Stomach/Bowel: Stomach is within normal limits. No evidence of bowel wall thickening or dilatation.  Appendix appears normal. 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 The main portal, splenic, superior mesenteric veins are patent. No abdominal aorta or iliac aneurysm. No abdominal, pelvic, or inguinal lymphadenopathy. Reproductive: Prostate is unremarkable. Other: No intraperitoneal free fluid. No intraperitoneal free gas. No organized fluid collection. Musculoskeletal: No abdominal wall hernia or abnormality. No suspicious lytic or blastic osseous lesions. No acute displaced fracture. Multilevel degenerative changes of the spine. IMPRESSION: Cirrhosis with portal hypertension. Recommend nonemergent MRI liver protocol for further evaluation. When the patient is clinically stable and able to follow directions and hold their breath (preferably as an outpatient) further evaluation with dedicated abdominal MRI should be considered. Electronically Signed   By: Tish Frederickson M.D.   On: 12/28/2020 15:54    Procedures Procedures   Medications Ordered in ED Medications  LORazepam (ATIVAN) injection 2 mg (2 mg Intravenous Given 12/28/20 1339)  iohexol (OMNIPAQUE) 350 MG/ML injection 80 mL (80 mLs Intravenous Contrast Given 12/28/20 1431)    ED Course  I have reviewed the triage vital signs and the nursing notes.  Pertinent labs & imaging results that were available during my care of the patient were reviewed by me and considered in my medical decision making (see chart for details).    MDM Rules/Calculators/A&P                           Labs show a low platelet count at 36,000 units.  Patient otherwise has no evidence of bleeding or petechia.  Globin stable.  Will advise outpatient follow-up with hematology.  Patient advised to call tomorrow to schedule the appointment.  Advised immediate return for any bleeding lightheadedness weakness trouble breathing or any additional concerns.  Final Clinical Impression(s) / ED Diagnoses Final diagnoses:  Thrombocytopenia [facility])    Rx / DC  Orders ED Discharge Orders     None        Cheryll Cockayne, MD 12/28/20 1642

## 2020-12-28 NOTE — ED Triage Notes (Signed)
Pt sent to ED for abnormal lab, platelet 46

## 2020-12-29 ENCOUNTER — Emergency Department (HOSPITAL_COMMUNITY): Payer: Self-pay

## 2020-12-29 ENCOUNTER — Inpatient Hospital Stay (HOSPITAL_COMMUNITY)
Admission: EM | Admit: 2020-12-29 | Discharge: 2021-01-06 | DRG: 871 | Disposition: A | Discharge status: Home / Self Care | Payer: Self-pay | Attending: Internal Medicine | Admitting: Internal Medicine

## 2020-12-29 ENCOUNTER — Encounter (HOSPITAL_COMMUNITY): Payer: Self-pay | Admitting: Internal Medicine

## 2020-12-29 DIAGNOSIS — G4733 Obstructive sleep apnea (adult) (pediatric): Secondary | ICD-10-CM | POA: Diagnosis present

## 2020-12-29 DIAGNOSIS — R4182 Altered mental status, unspecified: Secondary | ICD-10-CM

## 2020-12-29 DIAGNOSIS — N390 Urinary tract infection, site not specified: Secondary | ICD-10-CM | POA: Diagnosis present

## 2020-12-29 DIAGNOSIS — K7682 Hepatic encephalopathy: Secondary | ICD-10-CM | POA: Diagnosis present

## 2020-12-29 DIAGNOSIS — Z880 Allergy status to penicillin: Secondary | ICD-10-CM

## 2020-12-29 DIAGNOSIS — Z781 Physical restraint status: Secondary | ICD-10-CM

## 2020-12-29 DIAGNOSIS — F17200 Nicotine dependence, unspecified, uncomplicated: Secondary | ICD-10-CM | POA: Diagnosis present

## 2020-12-29 DIAGNOSIS — I10 Essential (primary) hypertension: Secondary | ICD-10-CM | POA: Diagnosis present

## 2020-12-29 DIAGNOSIS — J189 Pneumonia, unspecified organism: Secondary | ICD-10-CM | POA: Diagnosis present

## 2020-12-29 DIAGNOSIS — F10239 Alcohol dependence with withdrawal, unspecified: Secondary | ICD-10-CM | POA: Diagnosis present

## 2020-12-29 DIAGNOSIS — Z20822 Contact with and (suspected) exposure to covid-19: Secondary | ICD-10-CM | POA: Diagnosis present

## 2020-12-29 DIAGNOSIS — K766 Portal hypertension: Secondary | ICD-10-CM | POA: Diagnosis present

## 2020-12-29 DIAGNOSIS — E876 Hypokalemia: Secondary | ICD-10-CM | POA: Diagnosis present

## 2020-12-29 DIAGNOSIS — D696 Thrombocytopenia, unspecified: Secondary | ICD-10-CM | POA: Diagnosis present

## 2020-12-29 DIAGNOSIS — D61818 Other pancytopenia: Secondary | ICD-10-CM | POA: Diagnosis present

## 2020-12-29 DIAGNOSIS — K7040 Alcoholic hepatic failure without coma: Secondary | ICD-10-CM | POA: Diagnosis present

## 2020-12-29 DIAGNOSIS — D731 Hypersplenism: Secondary | ICD-10-CM | POA: Diagnosis present

## 2020-12-29 DIAGNOSIS — R319 Hematuria, unspecified: Secondary | ICD-10-CM | POA: Diagnosis not present

## 2020-12-29 DIAGNOSIS — F10931 Alcohol use, unspecified with withdrawal delirium: Secondary | ICD-10-CM | POA: Diagnosis present

## 2020-12-29 DIAGNOSIS — F10231 Alcohol dependence with withdrawal delirium: Secondary | ICD-10-CM | POA: Diagnosis present

## 2020-12-29 DIAGNOSIS — K5900 Constipation, unspecified: Secondary | ICD-10-CM | POA: Diagnosis not present

## 2020-12-29 DIAGNOSIS — R7989 Other specified abnormal findings of blood chemistry: Secondary | ICD-10-CM | POA: Diagnosis present

## 2020-12-29 DIAGNOSIS — A419 Sepsis, unspecified organism: Principal | ICD-10-CM | POA: Diagnosis present

## 2020-12-29 DIAGNOSIS — G9340 Encephalopathy, unspecified: Secondary | ICD-10-CM

## 2020-12-29 DIAGNOSIS — R509 Fever, unspecified: Secondary | ICD-10-CM

## 2020-12-29 DIAGNOSIS — Z4659 Encounter for fitting and adjustment of other gastrointestinal appliance and device: Secondary | ICD-10-CM

## 2020-12-29 DIAGNOSIS — K7030 Alcoholic cirrhosis of liver without ascites: Secondary | ICD-10-CM | POA: Diagnosis present

## 2020-12-29 DIAGNOSIS — F10939 Alcohol use, unspecified with withdrawal, unspecified: Secondary | ICD-10-CM | POA: Diagnosis present

## 2020-12-29 DIAGNOSIS — Z79899 Other long term (current) drug therapy: Secondary | ICD-10-CM

## 2020-12-29 HISTORY — DX: Alcohol use, unspecified with withdrawal delirium: F10.931

## 2020-12-29 HISTORY — DX: Alcohol dependence with withdrawal delirium: F10.231

## 2020-12-29 HISTORY — DX: Unspecified cirrhosis of liver: K74.60

## 2020-12-29 HISTORY — DX: Essential (primary) hypertension: I10

## 2020-12-29 HISTORY — DX: Alcohol abuse, uncomplicated: F10.10

## 2020-12-29 LAB — CBC WITH DIFFERENTIAL/PLATELET
Abs Immature Granulocytes: 0.01 10*3/uL (ref 0.00–0.07)
Basophils Absolute: 0 10*3/uL (ref 0.0–0.1)
Basophils Relative: 1 %
Eosinophils Absolute: 0.1 10*3/uL (ref 0.0–0.5)
Eosinophils Relative: 2 %
HCT: 34.1 % — ABNORMAL LOW (ref 39.0–52.0)
Hemoglobin: 11.5 g/dL — ABNORMAL LOW (ref 13.0–17.0)
Immature Granulocytes: 0 %
Lymphocytes Relative: 15 %
Lymphs Abs: 0.5 10*3/uL — ABNORMAL LOW (ref 0.7–4.0)
MCH: 32.3 pg (ref 26.0–34.0)
MCHC: 33.7 g/dL (ref 30.0–36.0)
MCV: 95.8 fL (ref 80.0–100.0)
Monocytes Absolute: 0.5 10*3/uL (ref 0.1–1.0)
Monocytes Relative: 14 %
Neutro Abs: 2.1 10*3/uL (ref 1.7–7.7)
Neutrophils Relative %: 68 %
Platelets: 32 10*3/uL — ABNORMAL LOW (ref 150–400)
RBC: 3.56 MIL/uL — ABNORMAL LOW (ref 4.22–5.81)
RDW: 13.2 % (ref 11.5–15.5)
WBC: 3.1 10*3/uL — ABNORMAL LOW (ref 4.0–10.5)
nRBC: 0 % (ref 0.0–0.2)

## 2020-12-29 LAB — HIV ANTIBODY (ROUTINE TESTING W REFLEX): HIV Screen 4th Generation wRfx: NONREACTIVE

## 2020-12-29 LAB — COMPREHENSIVE METABOLIC PANEL
ALT: 58 U/L — ABNORMAL HIGH (ref 0–44)
AST: 72 U/L — ABNORMAL HIGH (ref 15–41)
Albumin: 4 g/dL (ref 3.5–5.0)
Alkaline Phosphatase: 99 U/L (ref 38–126)
Anion gap: 5 (ref 5–15)
BUN: 10 mg/dL (ref 6–20)
CO2: 26 mmol/L (ref 22–32)
Calcium: 9.4 mg/dL (ref 8.9–10.3)
Chloride: 104 mmol/L (ref 98–111)
Creatinine, Ser: 0.45 mg/dL — ABNORMAL LOW (ref 0.61–1.24)
GFR, Estimated: >60 mL/min (ref >60)
Glucose, Bld: 109 mg/dL — ABNORMAL HIGH (ref 70–99)
Potassium: 3.2 mmol/L — ABNORMAL LOW (ref 3.5–5.1)
Sodium: 135 mmol/L (ref 135–145)
Total Bilirubin: 1.4 mg/dL — ABNORMAL HIGH (ref 0.3–1.2)
Total Protein: 7.9 g/dL (ref 6.5–8.1)

## 2020-12-29 LAB — RAPID URINE DRUG SCREEN, HOSP PERFORMED
Amphetamines: NOT DETECTED
Barbiturates: NOT DETECTED
Benzodiazepines: POSITIVE — AB
Cocaine: NOT DETECTED
Opiates: NOT DETECTED
Tetrahydrocannabinol: NOT DETECTED

## 2020-12-29 LAB — ABO/RH: ABO/RH(D): A POS

## 2020-12-29 LAB — PROTIME-INR
INR: 1.2 (ref 0.8–1.2)
Prothrombin Time: 15.1 seconds (ref 11.4–15.2)

## 2020-12-29 LAB — PHOSPHORUS: Phosphorus: 3.9 mg/dL (ref 2.5–4.6)

## 2020-12-29 LAB — MAGNESIUM: Magnesium: 2 mg/dL (ref 1.7–2.4)

## 2020-12-29 LAB — AMMONIA: Ammonia: 77 umol/L — ABNORMAL HIGH (ref 9–35)

## 2020-12-29 LAB — RESP PANEL BY RT-PCR (FLU A&B, COVID) ARPGX2
Influenza A by PCR: NEGATIVE
Influenza B by PCR: NEGATIVE
SARS Coronavirus 2 by RT PCR: NEGATIVE

## 2020-12-29 LAB — ETHANOL: Alcohol, Ethyl (B): <10 mg/dL (ref <10)

## 2020-12-29 LAB — MRSA NEXT GEN BY PCR, NASAL: MRSA by PCR Next Gen: NOT DETECTED

## 2020-12-29 LAB — GLUCOSE, CAPILLARY: Glucose-Capillary: 123 mg/dL — ABNORMAL HIGH (ref 70–99)

## 2020-12-29 MED ORDER — THIAMINE HCL 100 MG/ML IJ SOLN
500.0000 mg | Freq: Three times a day (TID) | INTRAVENOUS | Status: AC
  Administered 2020-12-29 – 2021-01-01 (×9): 500 mg via INTRAVENOUS
  Filled 2020-12-29 (×9): qty 5

## 2020-12-29 MED ORDER — MIDAZOLAM HCL 2 MG/2ML IJ SOLN
2.0000 mg | Freq: Once | INTRAMUSCULAR | Status: AC
  Administered 2020-12-29: 2 mg via INTRAVENOUS

## 2020-12-29 MED ORDER — ACETAMINOPHEN 650 MG RE SUPP: 650.0000 mg | Freq: Four times a day (QID) | RECTAL | Status: DC | PRN

## 2020-12-29 MED ORDER — LORAZEPAM 2 MG/ML IJ SOLN
1.0000 mg | INTRAMUSCULAR | Status: AC | PRN
  Administered 2020-12-29: 4 mg via INTRAVENOUS
  Administered 2020-12-30 – 2021-01-01 (×4): 2 mg via INTRAVENOUS
  Filled 2020-12-29 (×6): qty 1

## 2020-12-29 MED ORDER — LORAZEPAM 2 MG/ML IJ SOLN
0.0000 mg | INTRAMUSCULAR | Status: AC
  Administered 2020-12-29: 4 mg via INTRAVENOUS
  Administered 2020-12-30 (×4): 2 mg via INTRAVENOUS
  Administered 2020-12-30: 1 mg via INTRAVENOUS
  Administered 2020-12-30 – 2020-12-31 (×4): 2 mg via INTRAVENOUS
  Administered 2020-12-31: 1 mg via INTRAVENOUS
  Filled 2020-12-29 (×10): qty 1
  Filled 2020-12-29: qty 2

## 2020-12-29 MED ORDER — ACETAMINOPHEN 325 MG PO TABS
650.0000 mg | ORAL_TABLET | Freq: Four times a day (QID) | ORAL | Status: DC | PRN
Start: 2020-12-29 — End: 2020-12-30

## 2020-12-29 MED ORDER — POTASSIUM CHLORIDE CRYS ER 20 MEQ PO TBCR: 40.0000 meq | EXTENDED_RELEASE_TABLET | Freq: Two times a day (BID) | ORAL | Status: DC

## 2020-12-29 MED ORDER — MIDAZOLAM 50MG/50ML (1MG/ML) PREMIX INFUSION
0.5000 mg/h | INTRAVENOUS | Status: DC
  Administered 2020-12-29: 2 mg/h via INTRAVENOUS

## 2020-12-29 MED ORDER — MIDAZOLAM HCL 2 MG/2ML IJ SOLN: 4.0000 mg | Freq: Once | INTRAMUSCULAR | Status: AC

## 2020-12-29 MED ORDER — LACTULOSE 10 GM/15ML PO SOLN
20.0000 g | Freq: Once | ORAL | Status: AC
  Administered 2020-12-29: 20 g via ORAL
  Filled 2020-12-29: qty 30

## 2020-12-29 MED ORDER — ORAL CARE MOUTH RINSE
15.0000 mL | Freq: Two times a day (BID) | OROMUCOSAL | Status: DC
  Administered 2020-12-29 – 2021-01-06 (×15): 15 mL via OROMUCOSAL

## 2020-12-29 MED ORDER — LORAZEPAM 2 MG/ML IJ SOLN
0.0000 mg | Freq: Three times a day (TID) | INTRAMUSCULAR | Status: AC
  Administered 2020-12-31 – 2021-01-01 (×2): 1 mg via INTRAVENOUS
  Administered 2021-01-01 – 2021-01-02 (×4): 2 mg via INTRAVENOUS
  Filled 2020-12-29 (×6): qty 1

## 2020-12-29 MED ORDER — ONDANSETRON HCL 4 MG PO TABS: 4.0000 mg | ORAL_TABLET | Freq: Four times a day (QID) | ORAL | Status: DC | PRN

## 2020-12-29 MED ORDER — MIDAZOLAM 50MG/50ML (1MG/ML) PREMIX INFUSION
0.5000 mg/h | INTRAVENOUS | Status: DC
  Administered 2020-12-29: 0.5 mg/h via INTRAVENOUS
  Administered 2020-12-30 – 2020-12-31 (×3): 2 mg/h via INTRAVENOUS
  Filled 2020-12-29 (×4): qty 50

## 2020-12-29 MED ORDER — SODIUM CHLORIDE 0.9 % IV SOLN
260.0000 mg | Freq: Once | INTRAVENOUS | Status: DC
  Filled 2020-12-29: qty 2

## 2020-12-29 MED ORDER — LORAZEPAM 2 MG/ML IJ SOLN
0.0000 mg | Freq: Four times a day (QID) | INTRAMUSCULAR | Status: DC
  Administered 2020-12-29: 4 mg via INTRAVENOUS
  Administered 2020-12-29: 2 mg via INTRAVENOUS
  Filled 2020-12-29: qty 2
  Filled 2020-12-29 (×2): qty 1

## 2020-12-29 MED ORDER — DEXMEDETOMIDINE HCL IN NACL 200 MCG/50ML IV SOLN: 0.4000 ug/kg/h | INTRAVENOUS | Status: DC

## 2020-12-29 MED ORDER — LISINOPRIL 10 MG PO TABS: 10.0000 mg | ORAL_TABLET | Freq: Every day | ORAL | Status: DC

## 2020-12-29 MED ORDER — LORAZEPAM 1 MG PO TABS: 0.0000 mg | ORAL_TABLET | Freq: Two times a day (BID) | ORAL | Status: DC

## 2020-12-29 MED ORDER — ADULT MULTIVITAMIN W/MINERALS CH: 1.0000 | ORAL_TABLET | Freq: Every day | ORAL | Status: DC

## 2020-12-29 MED ORDER — LORAZEPAM 1 MG PO TABS: 0.0000 mg | ORAL_TABLET | Freq: Four times a day (QID) | ORAL | Status: DC

## 2020-12-29 MED ORDER — METOPROLOL TARTRATE 5 MG/5ML IV SOLN
5.0000 mg | Freq: Four times a day (QID) | INTRAVENOUS | Status: DC | PRN
  Filled 2020-12-29: qty 5

## 2020-12-29 MED ORDER — DEXMEDETOMIDINE HCL IN NACL 200 MCG/50ML IV SOLN
0.4000 ug/kg/h | INTRAVENOUS | Status: DC
  Administered 2020-12-29 – 2020-12-30 (×2): 1.2 ug/kg/h via INTRAVENOUS
  Administered 2020-12-30: 1.1 ug/kg/h via INTRAVENOUS
  Administered 2020-12-30 (×3): 1.2 ug/kg/h via INTRAVENOUS
  Administered 2020-12-30 (×2): 1 ug/kg/h via INTRAVENOUS
  Administered 2020-12-30 (×2): 1.2 ug/kg/h via INTRAVENOUS
  Administered 2020-12-31 (×2): 1.3 ug/kg/h via INTRAVENOUS
  Filled 2020-12-29 (×3): qty 50
  Filled 2020-12-29: qty 100
  Filled 2020-12-29 (×3): qty 50
  Filled 2020-12-29: qty 100
  Filled 2020-12-29 (×5): qty 50

## 2020-12-29 MED ORDER — PHENOBARBITAL SODIUM 130 MG/ML IJ SOLN: 130.0000 mg | Freq: Three times a day (TID) | INTRAMUSCULAR | Status: DC

## 2020-12-29 MED ORDER — LORAZEPAM 2 MG/ML IJ SOLN
1.0000 mg | Freq: Once | INTRAMUSCULAR | Status: AC
  Administered 2020-12-29: 1 mg via INTRAVENOUS

## 2020-12-29 MED ORDER — AMLODIPINE BESYLATE 10 MG PO TABS: 10.0000 mg | ORAL_TABLET | Freq: Every day | ORAL | Status: DC

## 2020-12-29 MED ORDER — FOLIC ACID 1 MG PO TABS: 1.0000 mg | ORAL_TABLET | Freq: Every day | ORAL | Status: DC

## 2020-12-29 MED ORDER — THIAMINE HCL 100 MG/ML IJ SOLN
Freq: Once | INTRAVENOUS | Status: AC
  Filled 2020-12-29: qty 1000

## 2020-12-29 MED ORDER — LACTULOSE 10 GM/15ML PO SOLN: 20.0000 g | Freq: Three times a day (TID) | ORAL | Status: DC

## 2020-12-29 MED ORDER — THIAMINE HCL 100 MG/ML IJ SOLN
200.0000 mg | Freq: Once | INTRAMUSCULAR | Status: AC
  Administered 2020-12-29: 200 mg via INTRAVENOUS
  Filled 2020-12-29: qty 2

## 2020-12-29 MED ORDER — CARVEDILOL 6.25 MG PO TABS
6.2500 mg | ORAL_TABLET | Freq: Two times a day (BID) | ORAL | Status: DC
  Filled 2020-12-29: qty 1

## 2020-12-29 MED ORDER — HALOPERIDOL LACTATE 5 MG/ML IJ SOLN
INTRAMUSCULAR | Status: AC
  Filled 2020-12-29: qty 1

## 2020-12-29 MED ORDER — LORAZEPAM 2 MG/ML IJ SOLN: 0.0000 mg | Freq: Two times a day (BID) | INTRAMUSCULAR | Status: DC

## 2020-12-29 MED ORDER — THIAMINE HCL 100 MG PO TABS
100.0000 mg | ORAL_TABLET | Freq: Every day | ORAL | Status: DC
  Administered 2021-01-02 – 2021-01-06 (×5): 100 mg via ORAL
  Filled 2020-12-29 (×5): qty 1

## 2020-12-29 MED ORDER — LORAZEPAM 1 MG PO TABS: 1.0000 mg | ORAL_TABLET | ORAL | Status: AC | PRN

## 2020-12-29 MED ORDER — HALOPERIDOL LACTATE 5 MG/ML IJ SOLN
5.0000 mg | Freq: Once | INTRAMUSCULAR | Status: AC
  Administered 2020-12-29: 5 mg via INTRAVENOUS

## 2020-12-29 MED ORDER — THIAMINE HCL 100 MG/ML IJ SOLN
100.0000 mg | Freq: Every day | INTRAMUSCULAR | Status: DC
  Administered 2020-12-29: 100 mg via INTRAVENOUS
  Filled 2020-12-29: qty 2

## 2020-12-29 MED ORDER — CHLORHEXIDINE GLUCONATE CLOTH 2 % EX PADS
6.0000 | MEDICATED_PAD | Freq: Every day | CUTANEOUS | Status: DC
  Administered 2020-12-29 – 2020-12-31 (×3): 6 via TOPICAL

## 2020-12-29 MED ORDER — ONDANSETRON HCL 4 MG/2ML IJ SOLN
4.0000 mg | Freq: Four times a day (QID) | INTRAMUSCULAR | Status: DC | PRN
  Administered 2021-01-03: 4 mg via INTRAVENOUS
  Filled 2020-12-29 (×2): qty 2

## 2020-12-29 MED ORDER — HYDRALAZINE HCL 20 MG/ML IJ SOLN
10.0000 mg | Freq: Four times a day (QID) | INTRAMUSCULAR | Status: DC | PRN
  Administered 2020-12-30: 10 mg via INTRAVENOUS
  Administered 2020-12-30: 5 mg via INTRAVENOUS
  Administered 2020-12-30 – 2021-01-02 (×7): 10 mg via INTRAVENOUS
  Filled 2020-12-29 (×11): qty 1

## 2020-12-29 NOTE — Plan of Care (Signed)
?  Problem: Safety: ?Goal: Non-violent Restraint(s) ?Outcome: Progressing ?  ?Problem: Education: ?Goal: Knowledge of General Education information will improve ?Description: Including pain rating scale, medication(s)/side effects and non-pharmacologic comfort measures ?Outcome: Progressing ?  ?Problem: Health Behavior/Discharge Planning: ?Goal: Ability to manage health-related needs will improve ?Outcome: Progressing ?  ?Problem: Clinical Measurements: ?Goal: Ability to maintain clinical measurements within normal limits will improve ?Outcome: Progressing ?Goal: Will remain free from infection ?Outcome: Progressing ?Goal: Diagnostic test results will improve ?Outcome: Progressing ?Goal: Respiratory complications will improve ?Outcome: Progressing ?Goal: Cardiovascular complication will be avoided ?Outcome: Progressing ?  ?Problem: Activity: ?Goal: Risk for activity intolerance will decrease ?Outcome: Progressing ?  ?Problem: Nutrition: ?Goal: Adequate nutrition will be maintained ?Outcome: Progressing ?  ?Problem: Coping: ?Goal: Level of anxiety will decrease ?Outcome: Progressing ?  ?Problem: Elimination: ?Goal: Will not experience complications related to bowel motility ?Outcome: Progressing ?Goal: Will not experience complications related to urinary retention ?Outcome: Progressing ?  ?Problem: Pain Managment: ?Goal: General experience of comfort will improve ?Outcome: Progressing ?  ?Problem: Safety: ?Goal: Ability to remain free from injury will improve ?Outcome: Progressing ?  ?Problem: Skin Integrity: ?Goal: Risk for impaired skin integrity will decrease ?Outcome: Progressing ?  ?Problem: Education: ?Goal: Knowledge of disease or condition will improve ?Outcome: Progressing ?Goal: Understanding of discharge needs will improve ?Outcome: Progressing ?  ?Problem: Health Behavior/Discharge Planning: ?Goal: Ability to identify changes in lifestyle to reduce recurrence of condition will improve ?Outcome:  Progressing ?Goal: Identification of resources available to assist in meeting health care needs will improve ?Outcome: Progressing ?  ?Problem: Physical Regulation: ?Goal: Complications related to the disease process, condition or treatment will be avoided or minimized ?Outcome: Progressing ?  ?Problem: Safety: ?Goal: Ability to remain free from injury will improve ?Outcome: Progressing ?  ?

## 2020-12-29 NOTE — H&P (Addendum)
History and Physical    [name] JQB:[id number] DOB: 11-08-1980 DOA: 12/29/2020  PCP: Pcp, No  Patient coming from: home  Chief Complaint: "I'm nervous"  HPI: [name] is a 40 y.o. male with medical history significant of HTN, EtOH abuse. Presenting with confusion. History per chart review as patient is confused and family is unavailable currently. Apparently he has a long history of EtOH abuse. He was seein the ED yesterday for thrombocytopenia. There was no evidence of bleeding, so he was sent home with recommendation for outpt follow up with hematology. At the time, he reported that his last drink was 2 days ago. He returned today with hallucinations and agitation. This was not apparent during his previous ED visit. There was concern for EtOH withdrawal, so [facility] was called for admission.     ED Course: He was started on CIWA protocol and given lactulose. He was found to be agitated, wandering the room, and pulling at medical devices. He was placed in soft restraints and a sitter was ordered.   Review of Systems:  Unable to obtain d/t mentation  PMHx HTN  PSHx Unable to obtain d/t mentation  SocHx  reports that he has been smoking. He has never used smokeless tobacco. He reports current alcohol use. He reports current drug use. Drug: Cocaine.  Allergies  Allergen Reactions   Penicillins Swelling    Per his wife: eyes swelled shut.    FamHx No family history on file.  Prior to Admission medications   Medication Sig Start Date End Date Taking? Authorizing Provider  amLODipine (NORVASC) 10 MG tablet Take 1 tablet (10 mg total) by mouth daily. Patient not taking: Reported on 12/28/2020 10/04/20 02/01/21  Cathren Harsh, MD  carvedilol (COREG) 6.25 MG tablet Take 1 tablet (6.25 mg total) by mouth 2 (two) times daily with a meal. Patient not taking: Reported on 12/28/2020 10/04/20   Rai, Delene Ruffini, MD  cyclobenzaprine (FLEXERIL) 10 MG tablet Take 10 mg  by mouth 2 (two) times daily as needed for muscle spasms.    [provider]  folic acid (FOLVITE) 1 MG tablet Take 1 tablet (1 mg total) by mouth daily. Patient not taking: Reported on 12/28/2020 10/05/20   Cathren Harsh, MD  indomethacin (INDOCIN) 50 MG capsule Take 50 mg by mouth 2 (two) times daily as needed for mild pain.    [provider]  lisinopril (ZESTRIL) 10 MG tablet Take 10 mg by mouth daily.    [provider]  QUEtiapine (SEROQUEL) 25 MG tablet Take 1 tablet (25 mg total) by mouth 2 (two) times daily. Patient not taking: Reported on 12/28/2020 10/04/20   Cathren Harsh, MD  thiamine 100 MG tablet Take 1 tablet (100 mg total) by mouth daily. Patient not taking: Reported on 12/28/2020 10/05/20   Cathren Harsh, MD    Physical Exam: Vitals:   12/29/20 0833 12/29/20 0928 12/29/20 0930 12/29/20 1005  BP: (!) 126/98 (!) 161/114 (!) 175/115 (!) 142/105  Pulse: 98 96 91 (!) 119  Resp: 18 17 18 18   Temp:      TempSrc:      SpO2: 99% 98% 100% 97%  Weight:      Height:        General: 40 y.o. male resting in bed in NAD Eyes: PERRL, normal sclera ENMT: Nares patent w/o discharge, orophaynx clear, dentition normal, ears w/o discharge/lesions/ulcers Neck: Supple, trachea midline Cardiovascular: tachy, +S1, S2, no m/g/r, equal pulses throughout  Respiratory: CTABL, no w/r/r, normal WOB GI: BS+, NDNT, no masses noted, no organomegaly noted MSK: No e/c/c Skin: No rashes, bruises, ulcerations noted Neuro: A&O x 3, but giving incoherent answers to half the questions asked; he is tremulous  Labs on Admission: I have personally reviewed following labs and imaging studies  CBC: Recent Labs  Lab 12/28/20 1201 12/29/20 0600  WBC 3.3* 3.1*  NEUTROABS 2.5 2.1  HGB 12.0* 11.5*  HCT 35.6* 34.1*  MCV 96.2 95.8  PLT 32* 32*   Basic Metabolic Panel: Recent Labs  Lab 12/28/20 1201 12/29/20 0600  NA 136 135  K 4.0 3.2*  CL 99 104  CO2 28 26  GLUCOSE 115*  109*  BUN 7 10  CREATININE 0.41* 0.45*  CALCIUM 9.2 9.4   GFR: Estimated Creatinine Clearance: 111.9 mL/min (A) (by C-G formula based on SCr of 0.45 mg/dL (L)). Liver Function Tests: Recent Labs  Lab 12/28/20 1201 12/29/20 0600  AST 99* 72*  ALT 71* 58*  ALKPHOS 118 99  BILITOT 1.9* 1.4*  PROT 8.4* 7.9  ALBUMIN 4.1 4.0   No results for input(s): LIPASE, AMYLASE in the last 168 hours. Recent Labs  Lab 12/29/20 0601  AMMONIA 77*   Coagulation Profile: Recent Labs  Lab 12/28/20 1201 12/29/20 0600  INR 1.1 1.2   Cardiac Enzymes: No results for input(s): CKTOTAL, CKMB, CKMBINDEX, TROPONINI in the last 168 hours. BNP (last 3 results) No results for input(s): PROBNP in the last 8760 hours. HbA1C: No results for input(s): HGBA1C in the last 72 hours. CBG: No results for input(s): GLUCAP in the last 168 hours. Lipid Profile: No results for input(s): CHOL, HDL, LDLCALC, TRIG, CHOLHDL, LDLDIRECT in the last 72 hours. Thyroid Function Tests: No results for input(s): TSH, T4TOTAL, FREET4, T3FREE, THYROIDAB in the last 72 hours. Anemia Panel: No results for input(s): VITAMINB12, FOLATE, FERRITIN, TIBC, IRON, RETICCTPCT in the last 72 hours. Urine analysis:    Component Value Date/Time   COLORURINE STRAW (A) 11/12/2019 1227   APPEARANCEUR CLEAR 11/12/2019 1227   LABSPEC 1.002 (L) 11/12/2019 1227   PHURINE 6.0 11/12/2019 1227   GLUCOSEU NEGATIVE 11/12/2019 1227   HGBUR NEGATIVE 11/12/2019 1227   BILIRUBINUR NEGATIVE 11/12/2019 1227   KETONESUR NEGATIVE 11/12/2019 1227   PROTEINUR NEGATIVE 11/12/2019 1227   NITRITE NEGATIVE 11/12/2019 1227   LEUKOCYTESUR NEGATIVE 11/12/2019 1227    Radiological Exams on Admission: CT Head Wo Contrast  Result Date: 12/29/2020 CLINICAL DATA:  40 year old male with hallucinations, delirium. Thrombocytopenia. EXAM: CT HEAD WITHOUT CONTRAST TECHNIQUE: Contiguous axial images were obtained from the base of the skull through the vertex  without intravenous contrast. COMPARISON:  Head CT 11/11/2019. FINDINGS: Brain: Cerebral volume is stable and within normal limits. No midline shift, ventriculomegaly, mass effect, evidence of mass lesion, intracranial hemorrhage or evidence of cortically based acute infarction. Gray-white matter differentiation is within normal limits throughout the brain. Vascular: No suspicious intracranial vascular hyperdensity. Skull: Negative aside from stable asymmetric positioning of the right TMJ. Subtle right mandible condyle anterior subluxation. Sinuses/Orbits: Visualized paranasal sinuses and mastoids are stable and well aerated. Other: Visualized orbits and scalp soft tissues are within normal limits. IMPRESSION: Stable and normal noncontrast CT appearance of the brain. Electronically Signed   By: Odessa Fleming M.D.   On: 12/29/2020 07:35   CT Abdomen Pelvis W Contrast  Result Date: 12/28/2020 CLINICAL DATA:  Acute nonlocalized abdominal pain. EXAM: CT ABDOMEN AND PELVIS WITH CONTRAST TECHNIQUE: Multidetector CT imaging of the abdomen and pelvis  was performed using the standard protocol following bolus administration of intravenous contrast. CONTRAST:  43mL OMNIPAQUE IOHEXOL 350 MG/ML SOLN COMPARISON:  None. FINDINGS: Lower chest: No acute abnormality. Hepatobiliary: Nodular hepatic contour. The hepatic parenchyma is diffusely hypodense compared to the splenic parenchyma consistent with fatty infiltration. No focal liver abnormality. Nonspecific gallbladder wall thickening which can be seen in the setting of chronic liver changes. No gallstones or pericholecystic fluid. No biliary dilatation. Pancreas: No focal lesion. Normal pancreatic contour. No surrounding inflammatory changes. No main pancreatic ductal dilatation. Spleen: The spleen is enlarged measuring up to 15 cm. No focal splenic lesion. Adrenals/Urinary Tract: No adrenal nodule bilaterally. Bilateral kidneys enhance symmetrically. Subcentimeter hypodensities  are too small to characterize. No hydronephrosis. No hydroureter. The urinary bladder is unremarkable. Stomach/Bowel: Stomach is within normal limits. No evidence of bowel wall thickening or dilatation. Appendix appears normal. 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 The main portal, splenic, superior mesenteric veins are patent. No abdominal aorta or iliac aneurysm. No abdominal, pelvic, or inguinal lymphadenopathy. Reproductive: Prostate is unremarkable. Other: No intraperitoneal free fluid. No intraperitoneal free gas. No organized fluid collection. Musculoskeletal: No abdominal wall hernia or abnormality. No suspicious lytic or blastic osseous lesions. No acute displaced fracture. Multilevel degenerative changes of the spine. IMPRESSION: Cirrhosis with portal hypertension. Recommend nonemergent MRI liver protocol for further evaluation. When the patient is clinically stable and able to follow directions and hold their breath (preferably as an outpatient) further evaluation with dedicated abdominal MRI should be considered. Electronically Signed   By: Tish Frederickson M.D.   On: 12/28/2020 15:54    EKG: None obtained in ED  Assessment/Plan EtOH withdrawal Hepatic encephalopathy Elevated LFTs Cirrhosis     - admit to inpt, SDU     - CIWA ordered; daily thiamine, THF; he's pretty agitated, may need to start precedex, will speak with PCCM     - ok for PO right now     - continue lactulose     - check hepatitis panel     - recommendation from radiology is follow up non-emergent MRI of liver to clarify current assessment; will schedule once mentation clears     - continue soft restraints for now and sitter    Hypokalemia     - check Mg2+; replace K+  HTN     - not taking home meds; will resume what we have on file, add PRN  Pancytopenia     - chronic, owing to liver disease; no evidence of bleed, follow  DVT prophylaxis: SCDs  Code Status: FULL  Family Communication: attempted  call to spouse and daughter; unable to make contact  Consults called: PCCM   Status is: Inpatient  Remains inpatient appropriate because:Inpatient level of care appropriate due to severity of illness  Dispo: The patient is from: Home              Anticipated d/c is to: Home              Patient currently is not medically stable to d/c.   Difficult to place patient No  Time spent coordinating admission: 70 minutes  Phi Avans A Infantof Villagomez DO [facility]  If 7PM-7AM, please contact night-coverage [email or web address]  12/29/2020, 10:31 AM

## 2020-12-29 NOTE — Progress Notes (Signed)
Patient was assessed for urinary retention, BS showed 185. Will continue to monitor.

## 2020-12-29 NOTE — Progress Notes (Signed)
eLink Physician-Brief Progress Note Patient Name: [name] DOB: October 28, 1980 MRN: [id number]   Date of Service  12/29/2020  HPI/Events of Note  Severe agitation d/t ETOH W/D.  eICU Interventions  Plan: Increase ceiling on Precedex IV infusion to 1.6 mcg/kg/hour. Titrate to RASS = 0.     Intervention Category Major Interventions: Arrhythmia - evaluation and management  Froilan Mclean Eugene 12/29/2020, 9:07 PM

## 2020-12-29 NOTE — ED Provider Notes (Signed)
Received signout from previous provider, please see her note for complete H&P.  This is a 40 year old male significant history of alcohol abuse disorder who presenting with increased confusion, hallucination, and tremors.  Last alcohol use was approximately 3 days ago. Blood work demonstrate an elevated ammonia level of 77, lactulose given.  Patient placed on CIWA protocol.  He is still quite tremulous and altered.  9:54 AM Patient required additional Ativan to help with tremors and withdrawal symptoms.  Appreciate consultation from [facility] hospitalist, Dr. Ronaldo Miyamoto who agrees to see patient.  He request for a sitter to be available for patient safety.  .Critical Care Performed by: Fayrene Helper, PA-C Authorized by: Fayrene Helper, PA-C   Critical care provider statement:    Critical care time (minutes):  40   Critical care was time spent personally by me on the following activities:  Discussions with consultants, evaluation of patient's response to treatment, examination of patient, ordering and performing treatments and interventions, ordering and review of laboratory studies, ordering and review of radiographic studies, pulse oximetry, re-evaluation of patient's condition, obtaining history from patient or surrogate and review of old charts   BP (!) 126/98 (BP Location: Right Arm)   Pulse 98   Temp 98.9 F (37.2 C) (Oral)   Resp 18   Ht 5\' 6"  (1.676 m)   Wt 76.2 kg   SpO2 99%   BMI 27.12 kg/m   Results for orders placed or performed during the hospital encounter of 12/29/20  Resp Panel by RT-PCR (Flu A&B, Covid) Nasopharyngeal Swab   Specimen: Nasopharyngeal Swab; Nasopharyngeal(NP) swabs in vial transport medium  Result Value Ref Range   SARS Coronavirus 2 by RT PCR NEGATIVE NEGATIVE   Influenza A by PCR NEGATIVE NEGATIVE   Influenza B by PCR NEGATIVE NEGATIVE  CBC with Differential  Result Value Ref Range   WBC 3.1 (L) 4.0 - 10.5 K/uL   RBC 3.56 (L) 4.22 - 5.81 MIL/uL   Hemoglobin  11.5 (L) 13.0 - 17.0 g/dL   HCT 12/31/20 (L) 27.7 - 82.4 %   MCV 95.8 80.0 - 100.0 fL   MCH 32.3 26.0 - 34.0 pg   MCHC 33.7 30.0 - 36.0 g/dL   RDW 23.5 36.1 - 44.3 %   Platelets 32 (L) 150 - 400 K/uL   nRBC 0.0 0.0 - 0.2 %   Neutrophils Relative % 68 %   Neutro Abs 2.1 1.7 - 7.7 K/uL   Lymphocytes Relative 15 %   Lymphs Abs 0.5 (L) 0.7 - 4.0 K/uL   Monocytes Relative 14 %   Monocytes Absolute 0.5 0.1 - 1.0 K/uL   Eosinophils Relative 2 %   Eosinophils Absolute 0.1 0.0 - 0.5 K/uL   Basophils Relative 1 %   Basophils Absolute 0.0 0.0 - 0.1 K/uL   Immature Granulocytes 0 %   Abs Immature Granulocytes 0.01 0.00 - 0.07 K/uL  Comprehensive metabolic panel  Result Value Ref Range   Sodium 135 135 - 145 mmol/L   Potassium 3.2 (L) 3.5 - 5.1 mmol/L   Chloride 104 98 - 111 mmol/L   CO2 26 22 - 32 mmol/L   Glucose, Bld 109 (H) 70 - 99 mg/dL   BUN 10 6 - 20 mg/dL   Creatinine, Ser 15.4 (L) 0.61 - 1.24 mg/dL   Calcium 9.4 8.9 - 0.08 mg/dL   Total Protein 7.9 6.5 - 8.1 g/dL   Albumin 4.0 3.5 - 5.0 g/dL   AST 72 (H) 15 - 41 U/L  ALT 58 (H) 0 - 44 U/L   Alkaline Phosphatase 99 38 - 126 U/L   Total Bilirubin 1.4 (H) 0.3 - 1.2 mg/dL   GFR, Estimated >95 >63 mL/min   Anion gap 5 5 - 15  Ethanol  Result Value Ref Range   Alcohol, Ethyl (B) <10 <10 mg/dL  Ammonia  Result Value Ref Range   Ammonia 77 (H) 9 - 35 umol/L  Protime-INR  Result Value Ref Range   Prothrombin Time 15.1 11.4 - 15.2 seconds   INR 1.2 0.8 - 1.2  Rapid urine drug screen (hospital performed)  Result Value Ref Range   Opiates NONE DETECTED NONE DETECTED   Cocaine NONE DETECTED NONE DETECTED   Benzodiazepines POSITIVE (A) NONE DETECTED   Amphetamines NONE DETECTED NONE DETECTED   Tetrahydrocannabinol NONE DETECTED NONE DETECTED   Barbiturates NONE DETECTED NONE DETECTED  ABO/Rh  Result Value Ref Range   ABO/RH(D)      A POS Performed at [facility], [address] [place], [place] [address]     CT Head Wo Contrast  Result Date: 12/29/2020 CLINICAL DATA:  40 year old male with hallucinations, delirium. Thrombocytopenia. EXAM: CT HEAD WITHOUT CONTRAST TECHNIQUE: Contiguous axial images were obtained from the base of the skull through the vertex without intravenous contrast. COMPARISON:  Head CT 11/11/2019. FINDINGS: Brain: Cerebral volume is stable and within normal limits. No midline shift, ventriculomegaly, mass effect, evidence of mass lesion, intracranial hemorrhage or evidence of cortically based acute infarction. Gray-white matter differentiation is within normal limits throughout the brain. Vascular: No suspicious intracranial vascular hyperdensity. Skull: Negative aside from stable asymmetric positioning of the right TMJ. Subtle right mandible condyle anterior subluxation. Sinuses/Orbits: Visualized paranasal sinuses and mastoids are stable and well aerated. Other: Visualized orbits and scalp soft tissues are within normal limits. IMPRESSION: Stable and normal noncontrast CT appearance of the brain. Electronically Signed   By: Odessa Fleming M.D.   On: 12/29/2020 07:35   CT Abdomen Pelvis W Contrast  Result Date: 12/28/2020 CLINICAL DATA:  Acute nonlocalized abdominal pain. EXAM: CT ABDOMEN AND PELVIS WITH CONTRAST TECHNIQUE: Multidetector CT imaging of the abdomen and pelvis was performed using the standard protocol following bolus administration of intravenous contrast. CONTRAST:  25mL OMNIPAQUE IOHEXOL 350 MG/ML SOLN COMPARISON:  None. FINDINGS: Lower chest: No acute abnormality. Hepatobiliary: Nodular hepatic contour. The hepatic parenchyma is diffusely hypodense compared to the splenic parenchyma consistent with fatty infiltration. No focal liver abnormality. Nonspecific gallbladder wall thickening which can be seen in the setting of chronic liver changes. No gallstones or pericholecystic fluid. No biliary dilatation. Pancreas: No focal lesion. Normal pancreatic contour. No surrounding  inflammatory changes. No main pancreatic ductal dilatation. Spleen: The spleen is enlarged measuring up to 15 cm. No focal splenic lesion. Adrenals/Urinary Tract: No adrenal nodule bilaterally. Bilateral kidneys enhance symmetrically. Subcentimeter hypodensities are too small to characterize. No hydronephrosis. No hydroureter. The urinary bladder is unremarkable. Stomach/Bowel: Stomach is within normal limits. No evidence of bowel wall thickening or dilatation. Appendix appears normal. 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 The main portal, splenic, superior mesenteric veins are patent. No abdominal aorta or iliac aneurysm. No abdominal, pelvic, or inguinal lymphadenopathy. Reproductive: Prostate is unremarkable. Other: No intraperitoneal free fluid. No intraperitoneal free gas. No organized fluid collection. Musculoskeletal: No abdominal wall hernia or abnormality. No suspicious lytic or blastic osseous lesions. No acute displaced fracture. Multilevel degenerative changes of the spine. IMPRESSION: Cirrhosis with portal hypertension. Recommend nonemergent MRI liver protocol for further evaluation. When  the patient is clinically stable and able to follow directions and hold their breath (preferably as an outpatient) further evaluation with dedicated abdominal MRI should be considered. Electronically Signed   By: Tish Frederickson M.D.   On: 12/28/2020 15:54      Fayrene Helper, PA-C 12/29/20 [id number]    Mancel Bale, MD 12/29/20 [phone]

## 2020-12-29 NOTE — ED Provider Notes (Addendum)
[facility]-EMERGENCY DEPT Provider Note   CSN: [id number] Arrival date & time: 12/29/20  0518     History Chief Complaint  Patient presents with   Psychiatric Evaluation    Aulden Calise is a 40 y.o. male with a history of severe alcohol use disorder, DTs, alcohol withdrawal delirium, cirrhosis, polysubstance use disorder who presents to the emergency department with a chief complaint of hallucinations.  History is limited secondary to altered mental status.  Patient states that he was seen in the emergency department yesterday.  He states " I finished pill #8 of the orange pills, and I am concerned that that plus the medication that they stuck up my nose yesterday in the emergency department has caused me to start seeing things that are not there."  The patient has a longstanding history of heavy, daily alcohol use.  During my evaluation, patient states that his last drink of alcohol was 3 weeks ago.  He denies drinking mouthwash, hand sanitizer, or other sources of alcohol.  However, upon review of his medical record, with the patient was evaluated less than 24 hours ago in the emergency department he reported that his last drink was the night before his ER visit (9/27).  He denies illicit or recreational substance use.  He is tremulous, but states "my hands always shake."  He believes the month to be October.  He knows that the year is 2022, the name of the president, that he is in the hospital, and the current city and state.  He denies vomiting, abdominal pain, chest pain, shortness of breath.  No SI or HI.  No family at bedside.  Level 5 caveat secondary to altered mental status.  The history is provided by the patient and medical records. A language interpreter was used ([place]).      No past medical history on file.  Patient Active Problem List   Diagnosis Date Noted   LFT elevation 09/27/2020   Hyperbilirubinemia 09/27/2020    Accelerated hypertension 09/27/2020   Thrombocytopenia (HCC) 09/27/2020   firing gun at home, suspected Homicidal behavior, IVCed.  09/27/2020   Hypokalemia    Alcohol withdrawal delirium (HCC) 11/13/2019   DTs (delirium tremens) (HCC) 11/13/2019    No past surgical history on file.     No family history on file.  Social History   Tobacco Use   Smoking status: Some Days   Smokeless tobacco: Never  Substance Use Topics   Alcohol use: Yes    Comment: drinks 6 beers daily   Drug use: Yes    Types: Cocaine    Comment: states that he used cocaine on one occasion last night    Home Medications Prior to Admission medications   Medication Sig Start Date End Date Taking? Authorizing Provider  amLODipine (NORVASC) 10 MG tablet Take 1 tablet (10 mg total) by mouth daily. Patient not taking: Reported on 12/28/2020 10/04/20 02/01/21  Cathren Harsh, MD  carvedilol (COREG) 6.25 MG tablet Take 1 tablet (6.25 mg total) by mouth 2 (two) times daily with a meal. Patient not taking: Reported on 12/28/2020 10/04/20   Rai, Delene Ruffini, MD  cyclobenzaprine (FLEXERIL) 10 MG tablet Take 10 mg by mouth 2 (two) times daily as needed for muscle spasms.    [provider]  folic acid (FOLVITE) 1 MG tablet Take 1 tablet (1 mg total) by mouth daily. Patient not taking: Reported on 12/28/2020 10/05/20   Cathren Harsh, MD  indomethacin (INDOCIN) 50 MG  capsule Take 50 mg by mouth 2 (two) times daily as needed for mild pain.    [provider]  lisinopril (ZESTRIL) 10 MG tablet Take 10 mg by mouth daily.    [provider]  QUEtiapine (SEROQUEL) 25 MG tablet Take 1 tablet (25 mg total) by mouth 2 (two) times daily. Patient not taking: Reported on 12/28/2020 10/04/20   Cathren Harsh, MD  thiamine 100 MG tablet Take 1 tablet (100 mg total) by mouth daily. Patient not taking: Reported on 12/28/2020 10/05/20   Cathren Harsh, MD    Allergies    Penicillins  Review of Systems   Review of  Systems  Constitutional:  Negative for appetite change, chills and fever.  HENT:  Negative for congestion and sore throat.   Eyes:  Negative for visual disturbance.  Respiratory:  Negative for shortness of breath and wheezing.   Cardiovascular:  Negative for chest pain and palpitations.  Gastrointestinal:  Negative for abdominal pain, blood in stool, constipation, diarrhea, nausea and vomiting.  Genitourinary:  Negative for dysuria, frequency, genital sores, hematuria, penile discharge, penile pain, scrotal swelling and urgency.  Musculoskeletal:  Negative for back pain, myalgias and neck pain.  Skin:  Negative for rash and wound.  Allergic/Immunologic: Negative for immunocompromised state.  Neurological:  Positive for tremors. Negative for dizziness, seizures, syncope, weakness, light-headedness, numbness and headaches.  Psychiatric/Behavioral:  Positive for hallucinations. Negative for agitation, confusion, dysphoric mood, self-injury and suicidal ideas. The patient is not hyperactive.    Physical Exam Updated Vital Signs BP (!) 162/99   Pulse 72   Temp 98.9 F (37.2 C) (Oral)   Resp 18   Ht 5\' 6"  (1.676 m)   Wt 76.2 kg   SpO2 99%   BMI 27.12 kg/m   Physical Exam Vitals and nursing note reviewed.  Constitutional:      General: He is not in acute distress.    Appearance: He is well-developed. He is ill-appearing. He is not toxic-appearing or diaphoretic.  HENT:     Head: Normocephalic.  Eyes:     Extraocular Movements: Extraocular movements intact.     Conjunctiva/sclera: Conjunctivae normal.     Pupils: Pupils are equal, round, and reactive to light.  Cardiovascular:     Rate and Rhythm: Normal rate and regular rhythm.     Pulses: Normal pulses.     Heart sounds: Normal heart sounds. No murmur heard.   No friction rub. No gallop.  Pulmonary:     Effort: Pulmonary effort is normal. No respiratory distress.     Breath sounds: No stridor. No wheezing, rhonchi or rales.   Chest:     Chest wall: No tenderness.  Abdominal:     General: There is no distension.     Palpations: Abdomen is soft. There is no mass.     Tenderness: There is no abdominal tenderness. There is no right CVA tenderness, left CVA tenderness, guarding or rebound.     Hernia: No hernia is present.  Musculoskeletal:     Cervical back: Neck supple.     Right lower leg: No edema.     Left lower leg: No edema.  Skin:    General: Skin is warm and dry.     Capillary Refill: Capillary refill takes less than 2 seconds.     Coloration: Skin is not pale.     Findings: No erythema or rash.  Neurological:     Mental Status: He is alert.  Comments: Tremulous in the bilateral arms at rest. +Asterixis Alert and oriented x2.  However, he is confused about other details about the last few days and weeks.   Psychiatric:        Behavior: Behavior normal.    ED Results / Procedures / Treatments   Labs (all labs ordered are listed, but only abnormal results are displayed) Labs Reviewed  RESP PANEL BY RT-PCR (FLU A&B, COVID) ARPGX2  CBC WITH DIFFERENTIAL/PLATELET  COMPREHENSIVE METABOLIC PANEL  ETHANOL  AMMONIA  PROTIME-INR  RAPID URINE DRUG SCREEN, HOSP PERFORMED    EKG None  Radiology CT Abdomen Pelvis W Contrast  Result Date: 12/28/2020 CLINICAL DATA:  Acute nonlocalized abdominal pain. EXAM: CT ABDOMEN AND PELVIS WITH CONTRAST TECHNIQUE: Multidetector CT imaging of the abdomen and pelvis was performed using the standard protocol following bolus administration of intravenous contrast. CONTRAST:  70mL OMNIPAQUE IOHEXOL 350 MG/ML SOLN COMPARISON:  None. FINDINGS: Lower chest: No acute abnormality. Hepatobiliary: Nodular hepatic contour. The hepatic parenchyma is diffusely hypodense compared to the splenic parenchyma consistent with fatty infiltration. No focal liver abnormality. Nonspecific gallbladder wall thickening which can be seen in the setting of chronic liver changes. No gallstones  or pericholecystic fluid. No biliary dilatation. Pancreas: No focal lesion. Normal pancreatic contour. No surrounding inflammatory changes. No main pancreatic ductal dilatation. Spleen: The spleen is enlarged measuring up to 15 cm. No focal splenic lesion. Adrenals/Urinary Tract: No adrenal nodule bilaterally. Bilateral kidneys enhance symmetrically. Subcentimeter hypodensities are too small to characterize. No hydronephrosis. No hydroureter. The urinary bladder is unremarkable. Stomach/Bowel: Stomach is within normal limits. No evidence of bowel wall thickening or dilatation. Appendix appears normal. 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 The main portal, splenic, superior mesenteric veins are patent. No abdominal aorta or iliac aneurysm. No abdominal, pelvic, or inguinal lymphadenopathy. Reproductive: Prostate is unremarkable. Other: No intraperitoneal free fluid. No intraperitoneal free gas. No organized fluid collection. Musculoskeletal: No abdominal wall hernia or abnormality. No suspicious lytic or blastic osseous lesions. No acute displaced fracture. Multilevel degenerative changes of the spine. IMPRESSION: Cirrhosis with portal hypertension. Recommend nonemergent MRI liver protocol for further evaluation. When the patient is clinically stable and able to follow directions and hold their breath (preferably as an outpatient) further evaluation with dedicated abdominal MRI should be considered. Electronically Signed   By: Tish Frederickson M.D.   On: 12/28/2020 15:54    Procedures .Critical Care Performed by: Barkley Boards, PA-C Authorized by: Barkley Boards, PA-C   Critical care provider statement:    Critical care time (minutes):  35   Critical care time was exclusive of:  Separately billable procedures and treating other patients and teaching time   Critical care was necessary to treat or prevent imminent or life-threatening deterioration of the following conditions: Delirium  tremens.   Critical care was time spent personally by me on the following activities:  Ordering and performing treatments and interventions, ordering and review of laboratory studies, ordering and review of radiographic studies, pulse oximetry, re-evaluation of patient's condition, review of old charts, obtaining history from patient or surrogate, examination of patient, evaluation of patient's response to treatment and development of treatment plan with patient or surrogate   I assumed direction of critical care for this patient from another provider in my specialty: no     Medications Ordered in ED Medications  LORazepam (ATIVAN) injection 0-4 mg (2 mg Intravenous Given 12/29/20 0616)    Or  LORazepam (ATIVAN) tablet 0-4 mg ( Oral See Alternative 12/29/20  5009)  LORazepam (ATIVAN) injection 0-4 mg (has no administration in time range)    Or  LORazepam (ATIVAN) tablet 0-4 mg (has no administration in time range)  thiamine (B-1) injection 100 mg (has no administration in time range)  thiamine (B-1) injection 200 mg (has no administration in time range)    ED Course  I have reviewed the triage vital signs and the nursing notes.  Pertinent labs & imaging results that were available during my care of the patient were reviewed by me and considered in my medical decision making (see chart for details).    MDM Rules/Calculators/A&P                           40 year old male with a history of severe alcohol use disorder, DTs, alcohol withdrawal delirium, cirrhosis, polysubstance use disorder who presents to the emergency department with a chief complaint of hallucinations.  On arrival to the ED, he appears confused and is noted to be tremulous.  He is hypertensive, but vital signs are otherwise unremarkable.  On exam, he is confused and has asterixis.  Patient reports to me that his last alcoholic drink was approximately 3 weeks ago, but upon reviewing the patient's medical record he informed to  staff during his ER visit yesterday that he drank alcohol the night before his visit.  Patient was discussed with Dr. Clayborne Dana, attending physician.  Given his presentation, he appears to be in alcohol withdrawal.  With confusion, I am concerned for DTs.  I reviewed his CT from yesterday, which demonstrated cirrhosis with portal hypertension.  He was also noted to have thrombocytopenia, likely from chronic alcohol use, which was also the source of his elevated transaminases.  Will repeat labs today, check ammonia level.  We will also check head CT.  The patient will be started on IV benzodiazepines, given IV thiamine, and will most likely require admission to the hospital.  Patient care transferred to [facility] at the end of my shift. Patient presentation, ED course, and plan of care discussed with review of all pertinent labs and imaging. Please see his/her note for further details regarding further ED course and disposition.  ADDENDUM: Upon reevaluation the patient, the patient's wife is now at bedside.  Additional history was obtained using Spanish interpreter.  She reports that the last time that she saw the patient drinking alcohol was on Monday.  Reports that he drinks 9 large beers daily, on average.  She reports that after he left the hospital yesterday that he began hallucinating, which prompted her to bring him to the hospital.  She reports that he has been having regular nosebleeds.  Last episode of epistaxis was Sunday.  She reports that she has also been noticing more frequent bruising to his arms and trunk.  No rashes.  No gingival bleeding.  No hematemesis.  Discussed that patient will require admission to the hospital.  All questions answered with family.  Upon repeat chart review, ammonia is 77.  Will order lactulose.  Final Clinical Impression(s) / ED Diagnoses Final diagnoses:  None    Rx / DC Orders ED Discharge Orders     None        Barkley Boards, PA-C 12/29/20 0619     Frederik Pear A, PA-C 12/29/20 0710    Mesner, Barbara Cower, MD 12/29/20 [phone]

## 2020-12-29 NOTE — ED Notes (Signed)
Pt given urinal. Pt and family made aware of need for urine specimen.

## 2020-12-29 NOTE — ED Triage Notes (Addendum)
Pt says he was in the ED yesterday and took pills that harmed him. He says he is seeing things that are not there. Pt says he feels drunk. Spanish speaking only. Pt is shaky. Pt states he has not had alcohol for 2-3 weeks.

## 2020-12-29 NOTE — Consult Note (Signed)
NAME:  [name], MRN:  [id number], DOB:  04/02/1981, LOS: 0 ADMISSION DATE:  12/29/2020, CONSULTATION DATE:  12/29/20 REFERRING MD:  Ronaldo Miyamoto - TR[facility], CHIEF COMPLAINT:  alcohol withdrawals    History of Present Illness:  History obtained via chart review and medical team   40 yo M PMH EtOH abuse, HTN, presented to ED originally 9/28 due to abnormal lab: plt 32, and was dc home same day with rec for OP heme follow up. He did have CT a/p on 9/28 which revealed cirrhosis with portal hypertension.  Pt returned to ED 9/29 AM with CC hallucinations. Last etOH intake was 9/27 evening per 9/28 ED note, but on 9/29 pt states last drink 3wks ago.  In ED 9/29 pt hallucinating, oriented x2-3, shaking -- in apparent alcohol withdrawal.  Started on CIWA protocol. Labs resulted with ammonia 77 and he was started on lactulose. K low at 3.2, replaced.AST 72 and ALT 58 Tbili 1.4  WBC 3.1 RBC 3.5 Hgb 11.5 HCT 34 and plt 32  INR 1.2  Admitted to [facility], continued on CIWA. Had worsening withdrawal sx requiring increasing ativan doses. Worsening agitation. PCCM consulted for evaluation, ICU transfer and precedex initiation    Pertinent  Medical History  EtOH abuse HTN Thrombocytopenia  Cirrhosis with portal hypertension  Significant Hospital Events: Including procedures, antibiotic start and stop dates in addition to other pertinent events   9/29 admitted to [facility] for withdrawal. Worsening dt, transferred to ICU, PCCM consulted and started on dexmed  Interim History / Subjective:  PCCM ordered 4mg  Versed, 5mg  Haldol Started on precedex gtt as well as low dose versed gtt.   Sleeping in ICU now   Objective   Blood pressure 127/69, pulse 67, temperature 98.9 F (37.2 C), temperature source Oral, resp. rate (!) 21, height 5\' 6"  (1.676 m), weight 76.2 kg, SpO2 100 %.       No intake or output data in the 24 hours ending 12/29/20 1306 Filed Weights   12/29/20 0527  Weight: 76.2 kg     Examination: General: Ill appearing middle aged M reclined in bed asleep NAD HENT: NCAT pink mm trachea midline Lungs: CTAb symmetrical chest expansion no accessory use.  Cardiovascular: rrr s1s2 no rgm cap refill brisk Abdomen: soft round + bowel sounds Extremities: no acute deformity. 4pt restraints. No cyanosis or clubbing Neuro: Somnolent. Awakens to stimulation, moving BUE BLE spontaneously. Falls asleep quickly GU: yellow urine Skin: diaphoretic, warm. No rash   Resolved Hospital Problem list     Assessment & Plan:   Acute encephalopathy, multifactorial -Hepatic encephalopathy as well as EtOH withdrawal Alcohol abuse with agitated withdrawal P -cont dexmed gtt and low dose versed gtt -might need phenobarb if agitation resumes -maintain ETCO2 monitor and frequent assessment of airway protection -lactulose ordered -- will have to see if mentation will allow for POs, might need OGT vs BMS (+enema admin)  Alcoholic Cirrhosis  Portal Hypertension Hyperammonemia Elevated LFTs  P -trend hepatic function, coags  -lactulose as above -Outpt MRI liver protocol  -etoh cessation counseling when appropriate   Pancytopenia -new from 09/2020. In review of notes, looks like wife has endorsed pt has had frequent nosebleeds  -likely related to alcoholic cirrhosis, portal hypertension  P -trend cbc, diff  -supportive care  -defer workup of other etiologies at this time -hold chemical vte ppx with plt 32  HTN P -hold home meds while we see how hemodynamics respond to precedex  -PRN metop ordered   Hypokalemia  P -trend, replace lytes PRN  Best Practice (right click and "Reselect all SmartList Selections" daily)   Diet/type: NPO DVT prophylaxis: not indicated GI prophylaxis: N/A Lines: N/A Foley:  N/A Code Status:  full code Last date of multidisciplinary goals of care discussion [pending]  Labs   CBC: Recent Labs  Lab 12/28/20 1201 12/29/20 0600  WBC 3.3*  3.1*  NEUTROABS 2.5 2.1  HGB 12.0* 11.5*  HCT 35.6* 34.1*  MCV 96.2 95.8  PLT 32* 32*    Basic Metabolic Panel: Recent Labs  Lab 12/28/20 1201 12/29/20 0600  NA 136 135  K 4.0 3.2*  CL 99 104  CO2 28 26  GLUCOSE 115* 109*  BUN 7 10  CREATININE 0.41* 0.45*  CALCIUM 9.2 9.4   GFR: Estimated Creatinine Clearance: 111.9 mL/min (A) (by C-G formula based on SCr of 0.45 mg/dL (L)). Recent Labs  Lab 12/28/20 1201 12/29/20 0600  WBC 3.3* 3.1*    Liver Function Tests: Recent Labs  Lab 12/28/20 1201 12/29/20 0600  AST 99* 72*  ALT 71* 58*  ALKPHOS 118 99  BILITOT 1.9* 1.4*  PROT 8.4* 7.9  ALBUMIN 4.1 4.0   No results for input(s): LIPASE, AMYLASE in the last 168 hours. Recent Labs  Lab 12/29/20 0601  AMMONIA 77*    ABG    Component Value Date/Time   PHART 7.393 09/27/2020 1438   PCO2ART 39.0 09/27/2020 1438   PO2ART 118 (H) 09/27/2020 1438   HCO3 23.3 09/27/2020 1438   ACIDBASEDEF 0.9 09/27/2020 1438   O2SAT 98.5 09/27/2020 1438     Coagulation Profile: Recent Labs  Lab 12/28/20 1201 12/29/20 0600  INR 1.1 1.2    Cardiac Enzymes: No results for input(s): CKTOTAL, CKMB, CKMBINDEX, TROPONINI in the last 168 hours.  HbA1C: Hgb A1c MFr Bld  Date/Time Value Ref Range Status  09/29/2020 03:27 PM 5.7 (H) 4.8 - 5.6 % Final    Comment:    (NOTE)         Prediabetes: 5.7 - 6.4         Diabetes: >6.4         Glycemic control for adults with diabetes: <7.0     CBG: No results for input(s): GLUCAP in the last 168 hours.  Review of Systems:   Unable to obtain due to encephalopathy   Past Medical History:  He,  has no past medical history on file.  Alcohol abuse  Surgical History:  History reviewed. No pertinent surgical history.   Social History:   reports that he has been smoking. He has never used smokeless tobacco. He reports current alcohol use. He reports current drug use. Drug: Cocaine.   Family History:  His family history is not on  file.   Allergies Allergies  Allergen Reactions   Penicillins Swelling    Per his wife: eyes swelled shut.     Home Medications  Prior to Admission medications   Medication Sig Start Date End Date Taking? Authorizing Provider  cyclobenzaprine (FLEXERIL) 10 MG tablet Take 10 mg by mouth 2 (two) times daily as needed for muscle spasms.   Yes [provider]  indomethacin (INDOCIN) 50 MG capsule Take 50 mg by mouth 2 (two) times daily as needed for mild pain.   Yes [provider]  lisinopril (ZESTRIL) 10 MG tablet Take 10 mg by mouth daily.   Yes [provider]  amLODipine (NORVASC) 10 MG tablet Take 1 tablet (10 mg total) by mouth daily. Patient not taking:  Reported on 12/28/2020 10/04/20 02/01/21  Cathren Harsh, MD  carvedilol (COREG) 6.25 MG tablet Take 1 tablet (6.25 mg total) by mouth 2 (two) times daily with a meal. Patient not taking: Reported on 12/28/2020 10/04/20   Cathren Harsh, MD  folic acid (FOLVITE) 1 MG tablet Take 1 tablet (1 mg total) by mouth daily. Patient not taking: Reported on 12/28/2020 10/05/20   Rai, Delene Ruffini, MD  QUEtiapine (SEROQUEL) 25 MG tablet Take 1 tablet (25 mg total) by mouth 2 (two) times daily. Patient not taking: Reported on 12/28/2020 10/04/20   Cathren Harsh, MD  thiamine 100 MG tablet Take 1 tablet (100 mg total) by mouth daily. Patient not taking: Reported on 12/28/2020 10/05/20   Cathren Harsh, MD     Critical care time: 52 min     CRITICAL CARE Performed by: Lanier Clam   Total critical care time: 52 minutes  Critical care time was exclusive of separately billable procedures and treating other patients. Critical care was necessary to treat or prevent imminent or life-threatening deterioration.  Critical care was time spent personally by me on the following activities: development of treatment plan with patient and/or surrogate as well as nursing, discussions with consultants, evaluation of patient's response to  treatment, examination of patient, obtaining history from patient or surrogate, ordering and performing treatments and interventions, ordering and review of laboratory studies, ordering and review of radiographic studies, pulse oximetry and re-evaluation of patient's condition.  Tessie Fass MSN, AGACNP-BC [facility] Amion for pager  12/29/2020, 1:40 PM

## 2020-12-29 NOTE — Progress Notes (Signed)
Patient arrived to the unit, requiring aggressive treatment for agitation. See orders.

## 2020-12-30 ENCOUNTER — Inpatient Hospital Stay (HOSPITAL_COMMUNITY): Payer: Self-pay

## 2020-12-30 DIAGNOSIS — E876 Hypokalemia: Secondary | ICD-10-CM

## 2020-12-30 LAB — CBC WITH DIFFERENTIAL/PLATELET
Abs Immature Granulocytes: 0.02 10*3/uL (ref 0.00–0.07)
Basophils Absolute: 0 10*3/uL (ref 0.0–0.1)
Basophils Relative: 1 %
Eosinophils Absolute: 0.1 10*3/uL (ref 0.0–0.5)
Eosinophils Relative: 4 %
HCT: 33.2 % — ABNORMAL LOW (ref 39.0–52.0)
Hemoglobin: 11 g/dL — ABNORMAL LOW (ref 13.0–17.0)
Immature Granulocytes: 1 %
Lymphocytes Relative: 23 %
Lymphs Abs: 0.8 10*3/uL (ref 0.7–4.0)
MCH: 31.5 pg (ref 26.0–34.0)
MCHC: 33.1 g/dL (ref 30.0–36.0)
MCV: 95.1 fL (ref 80.0–100.0)
Monocytes Absolute: 0.4 10*3/uL (ref 0.1–1.0)
Monocytes Relative: 12 %
Neutro Abs: 2 10*3/uL (ref 1.7–7.7)
Neutrophils Relative %: 59 %
Platelets: 36 10*3/uL — ABNORMAL LOW (ref 150–400)
RBC: 3.49 MIL/uL — ABNORMAL LOW (ref 4.22–5.81)
RDW: 13.1 % (ref 11.5–15.5)
WBC: 3.3 10*3/uL — ABNORMAL LOW (ref 4.0–10.5)
nRBC: 0 % (ref 0.0–0.2)

## 2020-12-30 LAB — COMPREHENSIVE METABOLIC PANEL
ALT: 52 U/L — ABNORMAL HIGH (ref 0–44)
AST: 66 U/L — ABNORMAL HIGH (ref 15–41)
Albumin: 3.9 g/dL (ref 3.5–5.0)
Alkaline Phosphatase: 94 U/L (ref 38–126)
Anion gap: 7 (ref 5–15)
BUN: 10 mg/dL (ref 6–20)
CO2: 23 mmol/L (ref 22–32)
Calcium: 9.2 mg/dL (ref 8.9–10.3)
Chloride: 110 mmol/L (ref 98–111)
Creatinine, Ser: 0.4 mg/dL — ABNORMAL LOW (ref 0.61–1.24)
GFR, Estimated: >60 mL/min (ref >60)
Glucose, Bld: 123 mg/dL — ABNORMAL HIGH (ref 70–99)
Potassium: 3.4 mmol/L — ABNORMAL LOW (ref 3.5–5.1)
Sodium: 140 mmol/L (ref 135–145)
Total Bilirubin: 1.4 mg/dL — ABNORMAL HIGH (ref 0.3–1.2)
Total Protein: 7.6 g/dL (ref 6.5–8.1)

## 2020-12-30 LAB — GLUCOSE, CAPILLARY
Glucose-Capillary: 115 mg/dL — ABNORMAL HIGH (ref 70–99)
Glucose-Capillary: 106 mg/dL — ABNORMAL HIGH (ref 70–99)

## 2020-12-30 LAB — PROTIME-INR
INR: 1.2 (ref 0.8–1.2)
Prothrombin Time: 15.3 seconds — ABNORMAL HIGH (ref 11.4–15.2)

## 2020-12-30 MED ORDER — LACTULOSE 10 GM/15ML PO SOLN
20.0000 g | Freq: Three times a day (TID) | ORAL | Status: DC
  Administered 2020-12-30 – 2021-01-02 (×12): 20 g
  Filled 2020-12-30 (×12): qty 30

## 2020-12-30 MED ORDER — ACETAMINOPHEN 160 MG/5ML PO SOLN
650.0000 mg | Freq: Four times a day (QID) | ORAL | Status: DC | PRN
  Administered 2020-12-30 – 2021-01-04 (×6): 650 mg
  Filled 2020-12-30 (×7): qty 20.3

## 2020-12-30 MED ORDER — LACTATED RINGERS IV SOLN: INTRAVENOUS | Status: DC

## 2020-12-30 MED ORDER — FOLIC ACID 1 MG PO TABS
1.0000 mg | ORAL_TABLET | Freq: Every day | ORAL | Status: DC
  Administered 2020-12-30 – 2021-01-06 (×8): 1 mg
  Filled 2020-12-30 (×8): qty 1

## 2020-12-30 MED ORDER — POTASSIUM CHLORIDE 10 MEQ/100ML IV SOLN: 10.0000 meq | INTRAVENOUS | Status: DC

## 2020-12-30 MED ORDER — ADULT MULTIVITAMIN W/MINERALS CH
1.0000 | ORAL_TABLET | Freq: Every day | ORAL | Status: DC
  Administered 2020-12-30 – 2021-01-03 (×4): 1
  Filled 2020-12-30 (×5): qty 1

## 2020-12-30 MED ORDER — POTASSIUM CHLORIDE 20 MEQ PO PACK
40.0000 meq | PACK | Freq: Once | ORAL | Status: AC
  Administered 2020-12-30: 40 meq
  Filled 2020-12-30: qty 2

## 2020-12-30 NOTE — Plan of Care (Signed)
Discussed in front of patient plan of care for the evening, pain management and mouth care with no evidence of learning at this time.   Problem: Safety: Goal: Non-violent Restraint(s) Outcome: Not Progressing   Problem: Education: Goal: Knowledge of General Education information will improve Description: Including pain rating scale, medication(s)/side effects and non-pharmacologic comfort measures Outcome: Not Progressing

## 2020-12-30 NOTE — Progress Notes (Signed)
Bladder scanned patient at 1130. Patients bladder had a volume of 408 mL. Told Dr. Sherene Sires about patients urinary retention. Told MD this would be patients second in and out catherization. MD told RN to go ahead and put a foley in since patient had been retaining urine. Foley placed by Rubin Payor RN at 1200.

## 2020-12-30 NOTE — TOC Initial Note (Signed)
Transition of Care [facility]) - Initial/Assessment Note    Patient Details  Name: [name] MRN: [id number] Date of Birth: 04-26-1980  Transition of Care [facility]) CM/SW Contact:    Golda Acre, RN Phone Number: 12/30/2020, 8:38 AM  Clinical Narrative:                  40 y.o. male with medical history significant of HTN, EtOH abuse. Presenting with confusion. History per chart review as patient is confused and family is unavailable currently. Apparently he has a long history of EtOH abuse. He was seein the ED yesterday for thrombocytopenia. There was no evidence of bleeding, so he was sent home with recommendation for outpt follow up with hematology. At the time, he reported that his last drink was 2 days ago. He returned today with hallucinations and agitation. This was not apparent during his previous ED visit. There was concern for EtOH withdrawal, so TRH was called for admission.      ED Course: He was started on CIWA protocol and given lactulose. He was found to be agitated, wandering the room, and pulling at medical devices. He was placed in soft restraints and a sitter was ordered.  TOC PLAN OF CARE: will need substance abuse resources when awake and stable. Expected Discharge Plan: Home/Self Care Barriers to Discharge: Continued Medical Work up   Patient Goals and CMS Choice Patient states their goals for this hospitalization and ongoing recovery are:: unable to state      Expected Discharge Plan and Services Expected Discharge Plan: Home/Self Care   Discharge Planning Services: CM Consult   Living arrangements for the past 2 months: Single Family Home                                      Prior Living Arrangements/Services Living arrangements for the past 2 months: Single Family Home Lives with:: Self Patient language and need for interpreter reviewed:: Yes              Criminal Activity/Legal Involvement Pertinent to Current  Situation/Hospitalization: No - Comment as needed  Activities of Daily Living Home Assistive Devices/Equipment: None ADL Screening (condition at time of admission) Patient's cognitive ability adequate to safely complete daily activities?: No Is the patient deaf or have difficulty hearing?: No Does the patient have difficulty seeing, even when wearing glasses/contacts?: No Does the patient have difficulty concentrating, remembering, or making decisions?: Yes Patient able to express need for assistance with ADLs?: Yes Does the patient have difficulty dressing or bathing?: Yes Independently performs ADLs?: No Communication: Independent Dressing (OT): Needs assistance Is this a change from baseline?: Change from baseline, expected to last >3 days Grooming: Needs assistance Is this a change from baseline?: Change from baseline, expected to last >3 days Feeding: Needs assistance Is this a change from baseline?: Change from baseline, expected to last >3 days Bathing: Needs assistance Is this a change from baseline?: Change from baseline, expected to last >3 days Toileting: Needs assistance Is this a change from baseline?: Change from baseline, expected to last >3days In/Out Bed: Needs assistance Is this a change from baseline?: Change from baseline, expected to last >3 days Walks in Home: Needs assistance Is this a change from baseline?: Change from baseline, expected to last >3 days Does the patient have difficulty walking or climbing stairs?: Yes Weakness of Legs: None Weakness of Arms/Hands: None  Permission Sought/Granted  Emotional Assessment       Orientation: : Fluctuating Orientation (Suspected and/or reported Sundowners) Alcohol / Substance Use: Alcohol Use Psych Involvement: No (comment)  Admission diagnosis:  Alcohol withdrawal (HCC) [F10.239] Delirium tremens (HCC) [F10.231] Patient Active Problem List   Diagnosis Date Noted   Alcohol withdrawal  (HCC) 12/29/2020   LFT elevation 09/27/2020   Hyperbilirubinemia 09/27/2020   Accelerated hypertension 09/27/2020   Thrombocytopenia (HCC) 09/27/2020   firing gun at home, suspected Homicidal behavior, IVCed.  09/27/2020   Hypokalemia    Alcohol withdrawal delirium (HCC) 11/13/2019   DTs (delirium tremens) (HCC) 11/13/2019   PCP:  Oneita Hurt No Pharmacy:   [facility] [address], [place] - [address] [place] [address] Phone: [phone] Fax: [phone]     Social Determinants of Health (SDOH) Interventions    Readmission Risk Interventions No flowsheet data found.

## 2020-12-30 NOTE — Progress Notes (Signed)
NAME:  [name], MRN:  [id number], DOB:  [birth year], LOS: 1 ADMISSION DATE:  12/29/2020, CONSULTATION DATE:  12/29/20 REFERRING MD:  Ronaldo Miyamoto - TRH, CHIEF COMPLAINT:  alcohol withdrawals    History of Present Illness:  History obtained via chart review and medical team   40 yo M PMH EtOH abuse, HTN, presented to ED originally 9/28 due to abnormal lab: plt 32, and was dc home same day with rec for OP heme follow up. He did have CT a/p on 9/28 which revealed cirrhosis with portal hypertension.  Pt returned to ED 9/29 AM with CC hallucinations. Last etOH intake was 9/27 evening per 9/28 ED note, but on 9/29 pt states last drink 3wks ago.  In ED 9/29 pt hallucinating, oriented x2-3, shaking -- in apparent alcohol withdrawal.  Started on CIWA protocol. Labs resulted with ammonia 77 and he was started on lactulose. K low at 3.2, replaced.AST 72 and ALT 58 Tbili 1.4  WBC 3.1 RBC 3.5 Hgb 11.5 HCT 34 and plt 32  INR 1.2  Admitted to [facility], continued on CIWA. Had worsening withdrawal sx requiring increasing ativan doses. Worsening agitation. PCCM consulted for evaluation, ICU transfer and precedex initiation   Pertinent  Medical History  EtOH abuse HTN Thrombocytopenia  Cirrhosis with portal hypertension   Significant Hospital Events: Including procedures, antibiotic start and stop dates in addition to other pertinent events   9/29 admitted to [facility] for withdrawal. Worsening dt, transferred to ICU, PCCM consulted and started on dexmed 9/30 will place NGT vs do enema for lactulose. On low dose versed gtt and dexmed gtt. Replacing K. Might need phenobarb  Interim History / Subjective:  NAEO Continues on dexmed and versed gtt Hypertensive requiring PRN hydral   Objective   Blood pressure (!) 175/83, pulse 63, temperature 98.1 F (36.7 C), temperature source Oral, resp. rate (!) 23, height 5\' 6"  (1.676 m), weight 76.2 kg, SpO2 100 %.        Intake/Output Summary (Last 24 hours) at  12/30/2020 0928 Last data filed at 12/30/2020 01/01/2021 Gross per 24 hour  Intake 668.37 ml  Output 1300 ml  Net -631.63 ml   Filed Weights   12/29/20 0527  Weight: 76.2 kg    Examination: General: Chronically and acutely ill appearing middle aged M supine in bed  HENT: NCAT pink tacky mm ETCO2 monitor in place  Lungs: CTAb symmetrical chest expansion even unlabored  Cardiovascular: rrr s1s2 no rgm cap refill brisk  Abdomen: Soft ndnt +bowel sounds  Extremities: 4 pt soft restraints. No acute joint deformity, no cyanosis or clubbing  Neuro: Somnolent. Awakens spontaneously and to stimulation. Disoriented. Not following commands  GU: yellow urine, condom cath  Skin: Diaphoretic. No rash. Warm   Resolved Hospital Problem list     Assessment & Plan:   Acute encephalopathy, multifactorial -Hepatic encephalopathy as well as EtOH withdrawal, CNS depressing meds for dts management  EtOH abuse with agitated withdrawal, delirium  -previous Dts course this year was prolonged  P -cont dexmed gtt and low dose versed gtt -might need phenobarb if agitation resumes -maintain ETCO2 monitor and frequent assessment of airway protection -Will try to place small bore NGT 9/30, if unable will do lactulose enema  -thiamine, foalte, MVI -needs soft restraints for safety 9/30   Alcoholic cirrhosis Portal hypertension Hyperammonemia Elevated LFTs  P -trend hepatic function -PRN coags -lactulose as above -needs EtOH cessation  -Outpt MRI liver protocol   Pancytopenia  -new from 09/2020. In review  of notes, looks like wife has endorsed pt has had frequent nosebleeds  -suspect this is in setting of new alcoholic cirrhosis, portal hypertension  P -trend CBC  -supportive care  -hold chemical VTE ppx given degree of thrombocytopenia  -defer workup of other etiologies for pancytopenia at this time, re-eval as needed   HTN  P -holding home coreg -PRN hydral   Hypokalemia  P -replace -trend  lytes   Best Practice (right click and "Reselect all SmartList Selections" daily)   Diet/type: NPO DVT prophylaxis: not indicated GI prophylaxis: N/A Lines: N/A Foley:  N/A Code Status:  full code Last date of multidisciplinary goals of care discussion [pending]  Labs   CBC: Recent Labs  Lab 12/28/20 1201 12/29/20 0600 12/30/20 0258  WBC 3.3* 3.1* 3.3*  NEUTROABS 2.5 2.1 2.0  HGB 12.0* 11.5* 11.0*  HCT 35.6* 34.1* 33.2*  MCV 96.2 95.8 95.1  PLT 32* 32* 36*    Basic Metabolic Panel: Recent Labs  Lab 12/28/20 1201 12/29/20 0600 12/29/20 1553 12/30/20 0258  NA 136 135  --  140  K 4.0 3.2*  --  3.4*  CL 99 104  --  110  CO2 28 26  --  23  GLUCOSE 115* 109*  --  123*  BUN 7 10  --  10  CREATININE 0.41* 0.45*  --  0.40*  CALCIUM 9.2 9.4  --  9.2  MG  --   --  2.0  --   PHOS  --   --  3.9  --    GFR: Estimated Creatinine Clearance: 111.9 mL/min (A) (by C-G formula based on SCr of 0.4 mg/dL (L)). Recent Labs  Lab 12/28/20 1201 12/29/20 0600 12/30/20 0258  WBC 3.3* 3.1* 3.3*    Liver Function Tests: Recent Labs  Lab 12/28/20 1201 12/29/20 0600 12/30/20 0258  AST 99* 72* 66*  ALT 71* 58* 52*  ALKPHOS 118 99 94  BILITOT 1.9* 1.4* 1.4*  PROT 8.4* 7.9 7.6  ALBUMIN 4.1 4.0 3.9   No results for input(s): LIPASE, AMYLASE in the last 168 hours. Recent Labs  Lab 12/29/20 0601  AMMONIA 77*    ABG    Component Value Date/Time   PHART 7.393 09/27/2020 1438   PCO2ART 39.0 09/27/2020 1438   PO2ART 118 (H) 09/27/2020 1438   HCO3 23.3 09/27/2020 1438   ACIDBASEDEF 0.9 09/27/2020 1438   O2SAT 98.5 09/27/2020 1438     Coagulation Profile: Recent Labs  Lab 12/28/20 1201 12/29/20 0600 12/30/20 0258  INR 1.1 1.2 1.2    Cardiac Enzymes: No results for input(s): CKTOTAL, CKMB, CKMBINDEX, TROPONINI in the last 168 hours.  HbA1C: Hgb A1c MFr Bld  Date/Time Value Ref Range Status  09/29/2020 03:27 PM 5.7 (H) 4.8 - 5.6 % Final    Comment:    (NOTE)          Prediabetes: 5.7 - 6.4         Diabetes: >6.4         Glycemic control for adults with diabetes: <7.0     CBG: Recent Labs  Lab 12/29/20 1552 12/30/20 0417  GLUCAP 123* 115*   CRITICAL CARE Performed by: Lanier Clam   Total critical care time: 39 minutes  Critical care time was exclusive of separately billable procedures and treating other patients. Critical care was necessary to treat or prevent imminent or life-threatening deterioration.  Critical care was time spent personally by me on the following activities: development of treatment plan  with patient and/or surrogate as well as nursing, discussions with consultants, evaluation of patient's response to treatment, examination of patient, obtaining history from patient or surrogate, ordering and performing treatments and interventions, ordering and review of laboratory studies, ordering and review of radiographic studies, pulse oximetry and re-evaluation of patient's condition.  Tessie Fass MSN, AGACNP-BC [facility] Pulmonary/Critical Care Medicine Amion for pager  12/30/2020, 9:40 AM

## 2020-12-31 DIAGNOSIS — F10931 Alcohol use, unspecified with withdrawal delirium: Secondary | ICD-10-CM

## 2020-12-31 LAB — COMPREHENSIVE METABOLIC PANEL
ALT: 49 U/L — ABNORMAL HIGH (ref 0–44)
AST: 47 U/L — ABNORMAL HIGH (ref 15–41)
Albumin: 3.9 g/dL (ref 3.5–5.0)
Alkaline Phosphatase: 99 U/L (ref 38–126)
Anion gap: 5 (ref 5–15)
BUN: 12 mg/dL (ref 6–20)
CO2: 26 mmol/L (ref 22–32)
Calcium: 8.9 mg/dL (ref 8.9–10.3)
Chloride: 110 mmol/L (ref 98–111)
Creatinine, Ser: 0.53 mg/dL — ABNORMAL LOW (ref 0.61–1.24)
GFR, Estimated: >60 mL/min (ref >60)
Glucose, Bld: 128 mg/dL — ABNORMAL HIGH (ref 70–99)
Potassium: 3.3 mmol/L — ABNORMAL LOW (ref 3.5–5.1)
Sodium: 141 mmol/L (ref 135–145)
Total Bilirubin: 1.3 mg/dL — ABNORMAL HIGH (ref 0.3–1.2)
Total Protein: 7.7 g/dL (ref 6.5–8.1)

## 2020-12-31 LAB — AMMONIA: Ammonia: 58 umol/L — ABNORMAL HIGH (ref 9–35)

## 2020-12-31 LAB — GLUCOSE, CAPILLARY
Glucose-Capillary: 126 mg/dL — ABNORMAL HIGH (ref 70–99)
Glucose-Capillary: 115 mg/dL — ABNORMAL HIGH (ref 70–99)

## 2020-12-31 MED ORDER — POTASSIUM CHLORIDE 10 MEQ/100ML IV SOLN
10.0000 meq | INTRAVENOUS | Status: AC
Start: 2020-12-31 — End: 2020-12-31
  Administered 2020-12-31 (×4): 10 meq via INTRAVENOUS
  Filled 2020-12-31 (×4): qty 100

## 2020-12-31 MED ORDER — POTASSIUM CHLORIDE 20 MEQ PO PACK
20.0000 meq | PACK | ORAL | Status: AC
  Administered 2020-12-31 (×2): 20 meq
  Filled 2020-12-31 (×2): qty 1

## 2020-12-31 MED ORDER — DEXMEDETOMIDINE HCL IN NACL 400 MCG/100ML IV SOLN
0.4000 ug/kg/h | INTRAVENOUS | Status: DC
  Administered 2020-12-31: 1.3 ug/kg/h via INTRAVENOUS
  Administered 2020-12-31 – 2021-01-01 (×5): 1.2 ug/kg/h via INTRAVENOUS
  Administered 2021-01-01: 1 ug/kg/h via INTRAVENOUS
  Administered 2021-01-01: 1.1 ug/kg/h via INTRAVENOUS
  Administered 2021-01-01: 1.3 ug/kg/h via INTRAVENOUS
  Administered 2021-01-01: 1.2 ug/kg/h via INTRAVENOUS
  Administered 2021-01-02: 1 ug/kg/h via INTRAVENOUS
  Administered 2021-01-02: 0.5 ug/kg/h via INTRAVENOUS
  Administered 2021-01-02 (×2): 1.5 ug/kg/h via INTRAVENOUS
  Administered 2021-01-03: 1 ug/kg/h via INTRAVENOUS
  Filled 2020-12-31: qty 200
  Filled 2020-12-31 (×13): qty 100

## 2020-12-31 MED ORDER — CLONIDINE HCL 0.1 MG PO TABS
0.1000 mg | ORAL_TABLET | Freq: Three times a day (TID) | ORAL | Status: DC
  Administered 2020-12-31 – 2021-01-03 (×9): 0.1 mg
  Filled 2020-12-31 (×9): qty 1

## 2020-12-31 MED ORDER — CHLORHEXIDINE GLUCONATE CLOTH 2 % EX PADS
6.0000 | MEDICATED_PAD | Freq: Every day | CUTANEOUS | Status: DC
  Administered 2021-01-01 – 2021-01-06 (×5): 6 via TOPICAL

## 2020-12-31 NOTE — Plan of Care (Signed)
Discussed with patient plan of care for the evening, pain management and safety limits with no evidence of learning at this time.  Problem: Safety: Goal: Non-violent Restraint(s) Outcome: Not Progressing

## 2020-12-31 NOTE — Progress Notes (Signed)
NAME:  [name], MRN:  [id number], DOB:  [birth year], LOS: 2 ADMISSION DATE:  12/29/2020, CONSULTATION DATE:  12/29/20 REFERRING MD:  Ronaldo Miyamoto - TRH, CHIEF COMPLAINT:  alcohol withdrawal   History of Present Illness:  History obtained via chart review and medical team   40 yo M PMH EtOH abuse, HTN, presented to ED originally 9/28 due to abnormal lab: plt 32, and was dc home same day with rec for OP heme follow up. He did have CT a/p on 9/28 which revealed cirrhosis with portal hypertension.  Pt returned to ED 9/29 AM with CC hallucinations. Last etOH intake was 9/27 evening per 9/28 ED note, but on 9/29 pt stated last drink 3wks  PTA In ED 9/29 pt hallucinating, oriented x2-3, shaking -- in apparent alcohol withdrawal.  Started on CIWA protocol. Labs resulted with ammonia 77 and he was started on lactulose. K low at 3.2, replaced.AST 72 and ALT 58 Tbili 1.4  WBC 3.1 RBC 3.5 Hgb 11.5 HCT 34 and plt 32  INR 1.2  Admitted to [facility], continued on CIWA. Had worsening withdrawal sx requiring increasing ativan doses. Worsening agitation. PCCM consulted for evaluation, ICU transfer and precedex initiation   Pertinent  Medical History  EtOH abuse HTN Thrombocytopenia  Cirrhosis with portal hypertension   Significant Hospital Events: Including procedures, antibiotic start and stop dates in addition to other pertinent events   9/29 admitted to [facility] for withdrawal. Worsening dt, transferred to ICU, PCCM consulted and started on dexmed 9/30  placed NGT lactulose. On low dose versed gtt and dexmed gtt.    Scheduled Meds:  Chlorhexidine Gluconate Cloth  6 each Topical Daily   folic acid  1 mg Per Tube Daily   lactulose  20 g Per Tube TID   LORazepam  0-4 mg Intravenous Q4H   Followed by   LORazepam  0-4 mg Intravenous Q8H   mouth rinse  15 mL Mouth Rinse BID   multivitamin with minerals  1 tablet Per Tube Daily   potassium chloride  20 mEq Per Tube Q4H   [START ON 01/02/2021] thiamine   100 mg Oral Daily   Continuous Infusions:  dexmedetomidine (PRECEDEX) IV infusion 1.3 mcg/kg/hr (12/31/20 0645)   lactated ringers 50 mL/hr at 12/31/20 0830   midazolam 2 mg/hr (12/31/20 0645)   potassium chloride 10 mEq (12/31/20 0914)   thiamine injection Stopped (12/30/20 2127)   PRN Meds:.acetaminophen (TYLENOL) oral liquid 160 mg/5 mL, hydrALAZINE, LORazepam **OR** LORazepam, ondansetron **OR** ondansetron (ZOFRAN) IV   Interim History / Subjective:  Not responding to verbal but nad on versed and precedex drips plus prn ativan boluses   Objective   Blood pressure (!) 128/53, pulse (!) 50, temperature 98.2 F (36.8 C), resp. rate 18, height 5\' 6"  (1.676 m), weight 76.2 kg, SpO2 100 %.        Intake/Output Summary (Last 24 hours) at 12/31/2020 0938 Last data filed at 12/31/2020 0645 Gross per 24 hour  Intake 1849.21 ml  Output 1275 ml  Net 574.21 ml   Filed Weights   12/29/20 0527  Weight: 76.2 kg    Examination: Tmax  101.3 ? Trending down now General appearance:    sleepy latino male/ nad  At Rest 02 sats  100% on 5lpm NP  No jvd Oropharynx  mucosa nl/ NG in place Neck supple Lungs with a few scattered exp > insp rhonchi bilaterally RRR no s3 or or sign murmur Abd mod distended  with limited  excursion  Extr warm with no edema or clubbing noted Neuro  Sensorium no resp to verbal but moving all 4 spont    Resolved Hospital Problem list     Assessment & Plan:   Acute encephalopathy, multifactorial -Hepatic encephalopathy as well as EtOH withdrawal, using CNS depressing meds for dts management  EtOH abuse with agitated withdrawal, delirium  -previous Dts course this year was prolonged  P -cont dexmed gtt and low dose versed gtt -maintain ETCO2 monitor and frequent assessment of airway protection -thiamine, foalte, MVI   Alcoholic cirrhosis Portal hypertension Hyperammonemia Elevated LFTs  P -trend hepatic function -PRN coags -lactulose as  above -needs EtOH cessation  -Outpt MRI liver protocol   Pancytopenia    Lab Results  Component Value Date   HGB 11.0 (L) 12/30/2020   HGB 11.5 (L) 12/29/2020   HGB 12.0 (L) 12/28/2020    Lab Results  Component Value Date   PLT 36 (L) 12/30/2020   PLT 32 (L) 12/29/2020   PLT 32 (L) 12/28/2020    -suspect this is in setting of new alcoholic cirrhosis, portal hypertension with hypersplenism P -trend CBC  -supportive care  -hold chemical VTE ppx given degree of thrombocytopenia  -defer workup of other etiologies for pancytopenia at this time, re-eval as needed   HTN  P -holding home coreg -PRN hydral   Hypokalemia  P -replace -trend lytes   Best Practice (right click and "Reselect all SmartList Selections" daily)   Diet/type: NPO DVT prophylaxis: not indicated GI prophylaxis: N/A Lines: N/A Foley:  N/A Code Status:  full code Last date of multidisciplinary goals of care discussion [pending]  Labs   CBC: Recent Labs  Lab 12/28/20 1201 12/29/20 0600 12/30/20 0258  WBC 3.3* 3.1* 3.3*  NEUTROABS 2.5 2.1 2.0  HGB 12.0* 11.5* 11.0*  HCT 35.6* 34.1* 33.2*  MCV 96.2 95.8 95.1  PLT 32* 32* 36*    Basic Metabolic Panel: Recent Labs  Lab 12/28/20 1201 12/29/20 0600 12/29/20 1553 12/30/20 0258 12/31/20 0304  NA 136 135  --  140 141  K 4.0 3.2*  --  3.4* 3.3*  CL 99 104  --  110 110  CO2 28 26  --  23 26  GLUCOSE 115* 109*  --  123* 128*  BUN 7 10  --  10 12  CREATININE 0.41* 0.45*  --  0.40* 0.53*  CALCIUM 9.2 9.4  --  9.2 8.9  MG  --   --  2.0  --   --   PHOS  --   --  3.9  --   --    GFR: Estimated Creatinine Clearance: 111.9 mL/min (A) (by C-G formula based on SCr of 0.53 mg/dL (L)). Recent Labs  Lab 12/28/20 1201 12/29/20 0600 12/30/20 0258  WBC 3.3* 3.1* 3.3*    Liver Function Tests: Recent Labs  Lab 12/28/20 1201 12/29/20 0600 12/30/20 0258 12/31/20 0304  AST 99* 72* 66* 47*  ALT 71* 58* 52* 49*  ALKPHOS 118 99 94 99  BILITOT  1.9* 1.4* 1.4* 1.3*  PROT 8.4* 7.9 7.6 7.7  ALBUMIN 4.1 4.0 3.9 3.9   No results for input(s): LIPASE, AMYLASE in the last 168 hours. Recent Labs  Lab 12/29/20 0601 12/31/20 0304  AMMONIA 77* 58*    ABG    Component Value Date/Time   PHART 7.393 09/27/2020 1438   PCO2ART 39.0 09/27/2020 1438   PO2ART 118 (H) 09/27/2020 1438   HCO3 23.3 09/27/2020 1438   ACIDBASEDEF 0.9  09/27/2020 1438   O2SAT 98.5 09/27/2020 1438     Coagulation Profile: Recent Labs  Lab 12/28/20 1201 12/29/20 0600 12/30/20 0258  INR 1.1 1.2 1.2    Cardiac Enzymes: No results for input(s): CKTOTAL, CKMB, CKMBINDEX, TROPONINI in the last 168 hours.  HbA1C: Hgb A1c MFr Bld  Date/Time Value Ref Range Status  09/29/2020 03:27 PM 5.7 (H) 4.8 - 5.6 % Final    Comment:    (NOTE)         Prediabetes: 5.7 - 6.4         Diabetes: >6.4         Glycemic control for adults with diabetes: <7.0     CBG: Recent Labs  Lab 12/29/20 1552 12/30/20 0417 12/30/20 2003 12/31/20 0303  GLUCAP 123* 115* 106* 126*      The patient is critically ill with multiple organ systems failure and requires high complexity decision making for assessment and support, frequent evaluation and titration of therapies, application of advanced monitoring technologies and extensive interpretation of multiple databases. Critical Care Time devoted to patient care services described in this note is 35 minutes.   Sandrea Hughs, MD Pulmonary and Critical Care Medicine [facility] Cell [phone]   After 7:00 pm call Elink  [phone]

## 2020-12-31 NOTE — Progress Notes (Signed)
AM K+ 3.3 with creat 0.53 and GFR > 60. ELink CCM electrolyte protocol initiated.

## 2021-01-01 ENCOUNTER — Inpatient Hospital Stay (HOSPITAL_COMMUNITY): Payer: Self-pay

## 2021-01-01 LAB — GLUCOSE, CAPILLARY
Glucose-Capillary: 118 mg/dL — ABNORMAL HIGH (ref 70–99)
Glucose-Capillary: 185 mg/dL — ABNORMAL HIGH (ref 70–99)

## 2021-01-01 MED ORDER — LORAZEPAM 2 MG/ML IJ SOLN
2.0000 mg | Freq: Once | INTRAMUSCULAR | Status: AC
  Administered 2021-01-01: 2 mg via INTRAVENOUS
  Filled 2021-01-01: qty 1

## 2021-01-01 NOTE — Progress Notes (Signed)
eLink Physician-Brief Progress Note Patient Name: Tank Difiore DOB: 05/21/1980 MRN: [id number]   Date of Service  01/01/2021  HPI/Events of Note  Received request for ativan.  Pt with alcohol withdrawal and is on precedex gtt.  Ativan prn is q8hrs.  eICU Interventions  OK to give ativan IV now.      Intervention Category Minor Interventions: Agitation / anxiety - evaluation and management  Larinda Buttery 01/01/2021, 9:08 PM

## 2021-01-01 NOTE — Progress Notes (Addendum)
NAME:  [name], MRN:  [id number], DOB:  12-02-1980, LOS: 3 ADMISSION DATE:  12/29/2020, CONSULTATION DATE:  12/29/20 REFERRING MD:  Ronaldo Miyamoto - TRH, CHIEF COMPLAINT:  alcohol withdrawal   History of Present Illness:  History obtained via chart review and medical team   40 yo M PMH EtOH abuse, HTN, presented to ED originally 9/28 due to abnormal lab: plt 32, and was dc home same day with rec for OP heme follow up. He did have CT a/p on 9/28 which revealed cirrhosis with portal hypertension.  Pt returned to ED 9/29 AM with CC hallucinations. Last etOH intake was 9/27 evening per 9/28 ED note, but on 9/29 pt stated last drink 3wks  PTA In ED 9/29 pt hallucinating, oriented x2-3, shaking -- in apparent alcohol withdrawal.  Started on CIWA protocol. Labs resulted with ammonia 77 and he was started on lactulose. K low at 3.2, replaced.AST 72 and ALT 58 Tbili 1.4  WBC 3.1 RBC 3.5 Hgb 11.5 HCT 34 and plt 32  INR 1.2  Admitted to [facility], continued on CIWA. Had worsening withdrawal sx requiring increasing ativan doses. Worsening agitation. PCCM consulted for evaluation, ICU transfer and precedex initiation   Pertinent  Medical History  EtOH abuse HTN Thrombocytopenia  Cirrhosis with portal hypertension   Significant Hospital Events: Including procedures, antibiotic start and stop dates in addition to other pertinent events   9/29 admitted to [facility] for withdrawal. Worsening dt, transferred to ICU, PCCM consulted and started on dexmed 9/30  placed NGT lactulose. On low dose versed gtt and dexmed gtt.    Scheduled Meds:  Chlorhexidine Gluconate Cloth  6 each Topical Daily   cloNIDine  0.1 mg Per Tube TID   folic acid  1 mg Per Tube Daily   lactulose  20 g Per Tube TID   LORazepam  0-4 mg Intravenous Q8H   mouth rinse  15 mL Mouth Rinse BID   multivitamin with minerals  1 tablet Per Tube Daily   [START ON 01/02/2021] thiamine  100 mg Oral Daily   Continuous Infusions:   dexmedetomidine (PRECEDEX) IV infusion 1.3 mcg/kg/hr (01/01/21 0755)   lactated ringers 50 mL/hr at 01/01/21 0655   midazolam 2 mg/hr (01/01/21 0655)   thiamine injection Stopped (12/31/20 2128)   PRN Meds:.acetaminophen (TYLENOL) oral liquid 160 mg/5 mL, hydrALAZINE, LORazepam **OR** LORazepam, ondansetron **OR** ondansetron (ZOFRAN) IV   Interim History / Subjective:  Still on precedex and versed drips plus catapres 0.2 tid   Objective   Blood pressure (!) 146/88, pulse (!) 59, temperature 99.3 F (37.4 C), resp. rate 19, height 5\' 6"  (1.676 m), weight 72.7 kg, SpO2 100 %.    FiO2 (%):  [32 %] 32 %   Intake/Output Summary (Last 24 hours) at 01/01/2021 0834 Last data filed at 01/01/2021 0655 Gross per 24 hour  Intake 2139.74 ml  Output 1275 ml  Net 864.74 ml   Filed Weights   12/29/20 0527 01/01/21 0500  Weight: 76.2 kg 72.7 kg    Examination: Tmax  99.1  (trending down)  General appearance:    sedated, minimal response to "ola"  At Rest 02 sats  99% on 3lpm NP  No jvd Oropharynx clear,  mucosa nl Neck supple Lungs with a few scattered exp > insp rhonchi bilaterally RRR no s3 or or sign murmur Abd soft with nolexcursion  Extr warm with no edema or clubbing noted Neuro  Sensorium sedated ,  no apparent motor deficits    I  personally reviewed images and agree with radiology impression as follows:  CXR:   portable 10/2  No acute changes   Resolved Hospital Problem list     Assessment & Plan:   Acute encephalopathy, multifactorial -Hepatic encephalopathy as well as EtOH withdrawal, using CNS depressing meds for dts management  EtOH abuse with agitated withdrawal, delirium  -previous Dts course this year was prolonged  P -cont dexmed gtt / clonidine and taper off versed 1st  -maintain ETCO2 monitor and frequent assessment of airway protection -thiamine, foalte, MVI   Alcoholic cirrhosis Portal hypertension Hyperammonemia Elevated LFTs  P -trend hepatic  function -PRN coags -lactulose as above -needs EtOH cessation  -Outpt MRI liver protocol   Pancytopenia    Lab Results  Component Value Date   HGB 11.0 (L) 12/30/2020   HGB 11.5 (L) 12/29/2020   HGB 12.0 (L) 12/28/2020    Lab Results  Component Value Date   PLT 36 (L) 12/30/2020   PLT 32 (L) 12/29/2020   PLT 32 (L) 12/28/2020    - c/w  alcoholic cirrhosis, portal hypertension >>> hypersplenism P -trend CBC  -supportive care  -hold chemical VTE ppx given degree of thrombocytopenia  -defer workup of other etiologies for pancytopenia at this time, re-eval as needed   HTN  P -holding home coreg - added clonidine 10/1 which is synergistic with precedex  -PRN hydral   Hypokalemia  P -replace -trend lytes   Best Practice (right click and "Reselect all SmartList Selections" daily)   Diet/type: NPO DVT prophylaxis: not indicated GI prophylaxis: N/A Lines: N/A Foley:  N/A Code Status:  full code Last date of multidisciplinary goals of care discussion [pending]  Labs   CBC: Recent Labs  Lab 12/28/20 1201 12/29/20 0600 12/30/20 0258  WBC 3.3* 3.1* 3.3*  NEUTROABS 2.5 2.1 2.0  HGB 12.0* 11.5* 11.0*  HCT 35.6* 34.1* 33.2*  MCV 96.2 95.8 95.1  PLT 32* 32* 36*    Basic Metabolic Panel: Recent Labs  Lab 12/28/20 1201 12/29/20 0600 12/29/20 1553 12/30/20 0258 12/31/20 0304  NA 136 135  --  140 141  K 4.0 3.2*  --  3.4* 3.3*  CL 99 104  --  110 110  CO2 28 26  --  23 26  GLUCOSE 115* 109*  --  123* 128*  BUN 7 10  --  10 12  CREATININE 0.41* 0.45*  --  0.40* 0.53*  CALCIUM 9.2 9.4  --  9.2 8.9  MG  --   --  2.0  --   --   PHOS  --   --  3.9  --   --    GFR: Estimated Creatinine Clearance: 111.9 mL/min (A) (by C-G formula based on SCr of 0.53 mg/dL (L)). Recent Labs  Lab 12/28/20 1201 12/29/20 0600 12/30/20 0258  WBC 3.3* 3.1* 3.3*    Liver Function Tests: Recent Labs  Lab 12/28/20 1201 12/29/20 0600 12/30/20 0258 12/31/20 0304  AST 99*  72* 66* 47*  ALT 71* 58* 52* 49*  ALKPHOS 118 99 94 99  BILITOT 1.9* 1.4* 1.4* 1.3*  PROT 8.4* 7.9 7.6 7.7  ALBUMIN 4.1 4.0 3.9 3.9   No results for input(s): LIPASE, AMYLASE in the last 168 hours. Recent Labs  Lab 12/29/20 0601 12/31/20 0304  AMMONIA 77* 58*    ABG    Component Value Date/Time   PHART 7.393 09/27/2020 1438   PCO2ART 39.0 09/27/2020 1438   PO2ART 118 (H) 09/27/2020 1438  HCO3 23.3 09/27/2020 1438   ACIDBASEDEF 0.9 09/27/2020 1438   O2SAT 98.5 09/27/2020 1438     Coagulation Profile: Recent Labs  Lab 12/28/20 1201 12/29/20 0600 12/30/20 0258  INR 1.1 1.2 1.2    Cardiac Enzymes: No results for input(s): CKTOTAL, CKMB, CKMBINDEX, TROPONINI in the last 168 hours.  HbA1C: Hgb A1c MFr Bld  Date/Time Value Ref Range Status  09/29/2020 03:27 PM 5.7 (H) 4.8 - 5.6 % Final    Comment:    (NOTE)         Prediabetes: 5.7 - 6.4         Diabetes: >6.4         Glycemic control for adults with diabetes: <7.0     CBG: Recent Labs  Lab 12/29/20 1552 12/30/20 0417 12/30/20 2003 12/31/20 0303 12/31/20 1950  GLUCAP 123* 115* 106* 126* 115*    Sandrea Hughs, MD Pulmonary and Critical Care Medicine [facility] Cell [phone]   After 7:00 pm call Elink  [phone]

## 2021-01-02 LAB — BASIC METABOLIC PANEL
Anion gap: 7 (ref 5–15)
BUN: <5 mg/dL — ABNORMAL LOW (ref 6–20)
CO2: 25 mmol/L (ref 22–32)
Calcium: 8.2 mg/dL — ABNORMAL LOW (ref 8.9–10.3)
Chloride: 99 mmol/L (ref 98–111)
Creatinine, Ser: 0.36 mg/dL — ABNORMAL LOW (ref 0.61–1.24)
GFR, Estimated: >60 mL/min (ref >60)
Glucose, Bld: 125 mg/dL — ABNORMAL HIGH (ref 70–99)
Potassium: 3.2 mmol/L — ABNORMAL LOW (ref 3.5–5.1)
Sodium: 131 mmol/L — ABNORMAL LOW (ref 135–145)

## 2021-01-02 LAB — CBC
HCT: 32 % — ABNORMAL LOW (ref 39.0–52.0)
Hemoglobin: 10.9 g/dL — ABNORMAL LOW (ref 13.0–17.0)
MCH: 32 pg (ref 26.0–34.0)
MCHC: 34.1 g/dL (ref 30.0–36.0)
MCV: 93.8 fL (ref 80.0–100.0)
Platelets: 67 10*3/uL — ABNORMAL LOW (ref 150–400)
RBC: 3.41 MIL/uL — ABNORMAL LOW (ref 4.22–5.81)
RDW: 13 % (ref 11.5–15.5)
WBC: 4 10*3/uL (ref 4.0–10.5)
nRBC: 0 % (ref 0.0–0.2)

## 2021-01-02 LAB — GLUCOSE, CAPILLARY
Glucose-Capillary: 111 mg/dL — ABNORMAL HIGH (ref 70–99)
Glucose-Capillary: 119 mg/dL — ABNORMAL HIGH (ref 70–99)
Glucose-Capillary: 128 mg/dL — ABNORMAL HIGH (ref 70–99)
Glucose-Capillary: 133 mg/dL — ABNORMAL HIGH (ref 70–99)
Glucose-Capillary: 154 mg/dL — ABNORMAL HIGH (ref 70–99)
Glucose-Capillary: 168 mg/dL — ABNORMAL HIGH (ref 70–99)

## 2021-01-02 LAB — PHOSPHORUS: Phosphorus: 3 mg/dL (ref 2.5–4.6)

## 2021-01-02 LAB — MAGNESIUM: Magnesium: 1.7 mg/dL (ref 1.7–2.4)

## 2021-01-02 MED ORDER — CHLORDIAZEPOXIDE HCL 25 MG PO CAPS
25.0000 mg | ORAL_CAPSULE | Freq: Four times a day (QID) | ORAL | Status: AC
  Administered 2021-01-02 – 2021-01-03 (×4): 25 mg via ORAL
  Filled 2021-01-02 (×4): qty 1

## 2021-01-02 MED ORDER — MAGNESIUM SULFATE 2 GM/50ML IV SOLN
2.0000 g | Freq: Once | INTRAVENOUS | Status: AC
  Administered 2021-01-02: 2 g via INTRAVENOUS
  Filled 2021-01-02: qty 50

## 2021-01-02 MED ORDER — CHLORDIAZEPOXIDE HCL 25 MG PO CAPS: 25.0000 mg | ORAL_CAPSULE | Freq: Four times a day (QID) | ORAL | Status: AC | PRN

## 2021-01-02 MED ORDER — LIP MEDEX EX OINT: TOPICAL_OINTMENT | CUTANEOUS | Status: DC | PRN

## 2021-01-02 MED ORDER — CHLORDIAZEPOXIDE HCL 25 MG PO CAPS
25.0000 mg | ORAL_CAPSULE | Freq: Every day | ORAL | Status: AC
  Administered 2021-01-05: 25 mg via ORAL
  Filled 2021-01-02: qty 1

## 2021-01-02 MED ORDER — CHLORDIAZEPOXIDE HCL 25 MG PO CAPS
25.0000 mg | ORAL_CAPSULE | ORAL | Status: AC
  Administered 2021-01-04 – 2021-01-05 (×2): 25 mg via ORAL
  Filled 2021-01-02 (×2): qty 1

## 2021-01-02 MED ORDER — ADULT MULTIVITAMIN W/MINERALS CH: 1.0000 | ORAL_TABLET | Freq: Every day | ORAL | Status: DC

## 2021-01-02 MED ORDER — LOPERAMIDE HCL 2 MG PO CAPS: 2.0000 mg | ORAL_CAPSULE | ORAL | Status: AC | PRN

## 2021-01-02 MED ORDER — CHLORDIAZEPOXIDE HCL 25 MG PO CAPS
25.0000 mg | ORAL_CAPSULE | Freq: Three times a day (TID) | ORAL | Status: AC
  Administered 2021-01-03 – 2021-01-04 (×3): 25 mg via ORAL
  Filled 2021-01-02 (×3): qty 1

## 2021-01-02 MED ORDER — HYDROXYZINE HCL 25 MG PO TABS: 25.0000 mg | ORAL_TABLET | Freq: Four times a day (QID) | ORAL | Status: DC | PRN

## 2021-01-02 MED ORDER — POTASSIUM CHLORIDE 20 MEQ PO PACK
40.0000 meq | PACK | Freq: Two times a day (BID) | ORAL | Status: AC
  Administered 2021-01-02 (×2): 40 meq via ORAL
  Filled 2021-01-02 (×2): qty 2

## 2021-01-02 MED ORDER — ONDANSETRON 4 MG PO TBDP: 4.0000 mg | ORAL_TABLET | Freq: Four times a day (QID) | ORAL | Status: AC | PRN

## 2021-01-02 NOTE — Progress Notes (Signed)
NAME:  [name], MRN:  [id number], DOB:  [birth year], LOS: 4 ADMISSION DATE:  12/29/2020, CONSULTATION DATE:  12/29/20 REFERRING MD:  Ronaldo Miyamoto - TRH, CHIEF COMPLAINT:  alcohol withdrawal   History of Present Illness:  History obtained via chart review and medical team   40 yo M PMH EtOH abuse, HTN, presented to ED originally 9/28 due to abnormal lab: plt 32, and was dc home same day with rec for OP heme follow up. He did have CT a/p on 9/28 which revealed cirrhosis with portal hypertension.  Pt returned to ED 9/29 AM with CC hallucinations. Last etOH intake was 9/27 evening per 9/28 ED note, but on 9/29 pt stated last drink 3wks  PTA In ED 9/29 pt hallucinating, oriented x2-3, shaking -- in apparent alcohol withdrawal.  Started on CIWA protocol. Labs resulted with ammonia 77 and he was started on lactulose. K low at 3.2, replaced.AST 72 and ALT 58 Tbili 1.4  WBC 3.1 RBC 3.5 Hgb 11.5 HCT 34 and plt 32  INR 1.2  Admitted to [facility], continued on CIWA. Had worsening withdrawal sx requiring increasing ativan doses. Worsening agitation. PCCM consulted for evaluation, ICU transfer and precedex initiation   Pertinent  Medical History  EtOH abuse HTN Thrombocytopenia  Cirrhosis with portal hypertension   Significant Hospital Events: Including procedures, antibiotic start and stop dates in addition to other pertinent events   9/29 admitted to [facility] for withdrawal. Worsening dt, transferred to ICU, PCCM consulted and started on dexmed 9/30  placed NGT lactulose. On low dose versed gtt and dexmed gtt.   10/2 added clonidine 10/3 added low dose librium    Interim History / Subjective:  Still sedated on precedex  Objective   Blood pressure (Abnormal) 152/60, pulse (Abnormal) 50, temperature 98.4 F (36.9 C), resp. rate (Abnormal) 27, height 5\' 6"  (1.676 m), weight 72.7 kg, SpO2 97 %.        Intake/Output Summary (Last 24 hours) at 01/02/2021 1118 Last data filed at 01/02/2021  0827 Gross per 24 hour  Intake 2042.41 ml  Output 2925 ml  Net -882.59 ml   Filed Weights   12/29/20 0527 01/01/21 0500  Weight: 76.2 kg 72.7 kg    Examination: Tmax  99.1  (trending down)  General appearance:    sedated, minimal response to "ola"  At Rest 02 sats  99% on 3lpm NP  No jvd Oropharynx clear,  mucosa nl Neck supple Lungs with a few scattered exp > insp rhonchi bilaterally RRR no s3 or or sign murmur Abd soft with nolexcursion  Extr warm with no edema or clubbing noted Neuro  Sensorium sedated ,  no apparent motor deficits    I personally reviewed images and agree with radiology impression as follows:  CXR:   portable 10/2  No acute changes   Resolved Hospital Problem list     Assessment & Plan:   Acute encephalopathy, multifactorial 2/2 Hepatic encephalopathy as well as EtOH withdrawal/ with DTs and agitated delirium  -previous Dts course this year was prolonged  Plan Add librium taper at lower dose given hepatic insuff  Wean precedex to off Cont clonidine  Cont ETCO2 checks  Cont thiamine and folate  Cont lactulose for hepatic component    Alcoholic cirrhosis w/ Portal hypertension & Hyperammonemia Elevated LFTs  Plan Cont to trend LFTs Lactulose as above Needs out pt MRI of liver if he can stop drinking  ETOH cessation   Pancytopenia in setting of liver disease & alcoholic  cirrhosis, portal hypertension >>> hypersplenism Appears to be stable . PLTs actually improving w/ no evidence of blood loss currently  Plan Cont to trend cbc Holding [place] heparin cont SCDs  HTN  Plan Holding coreg Cont clonidine PRN hydralazine   Fluid and electrolyte imbalance: hypokalemia and hypomagnesemia  Plan Replace and recheck  Best Practice (right click and "Reselect all SmartList Selections" daily)   Diet/type: Regular consistency (see orders) DVT prophylaxis: SCD GI prophylaxis: PPI Lines: N/A Foley:  Yes, and it is no longer needed Code Status:   full code Last date of multidisciplinary goals of care discussion [pending]  My cct 32 min   Simonne Martinet ACNP-BC [facility] Pulmonary/Critical Care Pager # [phone] OR # [phone] if no answer

## 2021-01-02 NOTE — Progress Notes (Signed)
During patient bath, nurse noted some swelling to patient's left arm, wrist and hand. Patient's left arm was bandaged from forearm to wrist in kerlix and coban. Nurse removed kerlix and coban from patient's arm. On assessment nurse noted that the 20g in patient's left arm, the cannula of IV of was noted at insertion site. Nurse discontinued IV due to presentation and swelling.  Patient's Left wrist measured at 8 inches and the right wrist measured at 7 inches. There was also some discoloration (pink and purple in color) noted at just below the IV insertion site. Dry dressing was applied to IV site and patient's arm was placed on 2 pillows with a warm compress. Restraint was removed from left arm during assessment. Will continue to monitor.

## 2021-01-03 ENCOUNTER — Inpatient Hospital Stay (HOSPITAL_COMMUNITY): Payer: Self-pay

## 2021-01-03 DIAGNOSIS — G9340 Encephalopathy, unspecified: Secondary | ICD-10-CM

## 2021-01-03 LAB — GLUCOSE, CAPILLARY
Glucose-Capillary: 113 mg/dL — ABNORMAL HIGH (ref 70–99)
Glucose-Capillary: 114 mg/dL — ABNORMAL HIGH (ref 70–99)
Glucose-Capillary: 117 mg/dL — ABNORMAL HIGH (ref 70–99)
Glucose-Capillary: 106 mg/dL — ABNORMAL HIGH (ref 70–99)
Glucose-Capillary: 127 mg/dL — ABNORMAL HIGH (ref 70–99)

## 2021-01-03 LAB — PROTIME-INR
INR: 1.4 — ABNORMAL HIGH (ref 0.8–1.2)
Prothrombin Time: 17 seconds — ABNORMAL HIGH (ref 11.4–15.2)

## 2021-01-03 LAB — CBC
HCT: 31.6 % — ABNORMAL LOW (ref 39.0–52.0)
Hemoglobin: 10.5 g/dL — ABNORMAL LOW (ref 13.0–17.0)
MCH: 31.8 pg (ref 26.0–34.0)
MCHC: 33.2 g/dL (ref 30.0–36.0)
MCV: 95.8 fL (ref 80.0–100.0)
Platelets: 97 10*3/uL — ABNORMAL LOW (ref 150–400)
RBC: 3.3 MIL/uL — ABNORMAL LOW (ref 4.22–5.81)
RDW: 13.3 % (ref 11.5–15.5)
WBC: 5.8 10*3/uL (ref 4.0–10.5)
nRBC: 0 % (ref 0.0–0.2)

## 2021-01-03 LAB — BASIC METABOLIC PANEL
Anion gap: 12 (ref 5–15)
BUN: 12 mg/dL (ref 6–20)
CO2: 21 mmol/L — ABNORMAL LOW (ref 22–32)
Calcium: 9.3 mg/dL (ref 8.9–10.3)
Chloride: 108 mmol/L (ref 98–111)
Creatinine, Ser: 1.15 mg/dL (ref 0.61–1.24)
GFR, Estimated: >60 mL/min (ref >60)
Glucose, Bld: 126 mg/dL — ABNORMAL HIGH (ref 70–99)
Potassium: 3.1 mmol/L — ABNORMAL LOW (ref 3.5–5.1)
Sodium: 141 mmol/L (ref 135–145)

## 2021-01-03 LAB — LACTIC ACID, PLASMA: Lactic Acid, Venous: 3.5 mmol/L (ref 0.5–1.9)

## 2021-01-03 LAB — MAGNESIUM: Magnesium: 1.9 mg/dL (ref 1.7–2.4)

## 2021-01-03 LAB — PREPARE RBC (CROSSMATCH)

## 2021-01-03 LAB — PROCALCITONIN: Procalcitonin: 25.18 ng/mL

## 2021-01-03 MED ORDER — LACTATED RINGERS IV BOLUS
750.0000 mL | Freq: Once | INTRAVENOUS | Status: AC
  Administered 2021-01-03: 750 mL via INTRAVENOUS

## 2021-01-03 MED ORDER — POTASSIUM CHLORIDE 10 MEQ/100ML IV SOLN
10.0000 meq | INTRAVENOUS | Status: AC
Start: 2021-01-03 — End: 2021-01-04
  Administered 2021-01-03 (×6): 10 meq via INTRAVENOUS
  Filled 2021-01-03 (×6): qty 100

## 2021-01-03 MED ORDER — SODIUM CHLORIDE 0.9 % IV SOLN
INTRAVENOUS | Status: DC | PRN
  Administered 2021-01-03: 250 mL via INTRAVENOUS

## 2021-01-03 MED ORDER — LACTULOSE 10 GM/15ML PO SOLN
30.0000 g | Freq: Three times a day (TID) | ORAL | Status: DC
  Administered 2021-01-04 – 2021-01-06 (×7): 30 g via ORAL
  Filled 2021-01-03 (×7): qty 45

## 2021-01-03 MED ORDER — CLONIDINE HCL 0.1 MG PO TABS: 0.1000 mg | ORAL_TABLET | Freq: Three times a day (TID) | ORAL | Status: DC

## 2021-01-03 MED ORDER — LACTATED RINGERS IV BOLUS
500.0000 mL | Freq: Once | INTRAVENOUS | Status: AC
  Administered 2021-01-03: 500 mL via INTRAVENOUS

## 2021-01-03 MED ORDER — HYDROXYZINE HCL 10 MG PO TABS
10.0000 mg | ORAL_TABLET | Freq: Three times a day (TID) | ORAL | Status: DC | PRN
  Administered 2021-01-06: 10 mg via ORAL
  Filled 2021-01-03 (×2): qty 1

## 2021-01-03 MED ORDER — SODIUM CHLORIDE 0.9 % IV SOLN
2.0000 g | INTRAVENOUS | Status: DC
  Administered 2021-01-03 – 2021-01-05 (×3): 2 g via INTRAVENOUS
  Filled 2021-01-03 (×4): qty 20

## 2021-01-03 MED ORDER — LACTATED RINGERS IV BOLUS
1000.0000 mL | Freq: Once | INTRAVENOUS | Status: AC
  Administered 2021-01-03: 1000 mL via INTRAVENOUS

## 2021-01-03 MED ORDER — LACTATED RINGERS IV SOLN: INTRAVENOUS | Status: DC

## 2021-01-03 MED ORDER — LACTULOSE 10 GM/15ML PO SOLN
30.0000 g | Freq: Three times a day (TID) | ORAL | Status: DC
  Administered 2021-01-03: 30 g
  Filled 2021-01-03: qty 45

## 2021-01-03 MED ORDER — METOCLOPRAMIDE HCL 5 MG/ML IJ SOLN
10.0000 mg | Freq: Once | INTRAMUSCULAR | Status: AC
  Administered 2021-01-03: 10 mg via INTRAVENOUS
  Filled 2021-01-03: qty 2

## 2021-01-03 MED ORDER — ADULT MULTIVITAMIN W/MINERALS CH
1.0000 | ORAL_TABLET | Freq: Every day | ORAL | Status: DC
  Administered 2021-01-04 – 2021-01-06 (×3): 1 via ORAL
  Filled 2021-01-03 (×3): qty 1

## 2021-01-03 NOTE — Progress Notes (Signed)
NAME:  Cammeron Greis, MRN:  [id number], DOB:  [birth year], LOS: 5 ADMISSION DATE:  12/29/2020, CONSULTATION DATE:  12/29/20 REFERRING MD:  Ronaldo Miyamoto - TRH, CHIEF COMPLAINT:  alcohol withdrawal   History of Present Illness:  History obtained via chart review and medical team   40 yo M PMH EtOH abuse, HTN, presented to ED originally 9/28 due to abnormal lab: plt 32, and was dc home same day with rec for OP heme follow up. He did have CT a/p on 9/28 which revealed cirrhosis with portal hypertension.  Pt returned to ED 9/29 AM with CC hallucinations. Last etOH intake was 9/27 evening per 9/28 ED note, but on 9/29 pt stated last drink 3wks  PTA In ED 9/29 pt hallucinating, oriented x2-3, shaking -- in apparent alcohol withdrawal.  Started on CIWA protocol. Labs resulted with ammonia 77 and he was started on lactulose. K low at 3.2, replaced.AST 72 and ALT 58 Tbili 1.4  WBC 3.1 RBC 3.5 Hgb 11.5 HCT 34 and plt 32  INR 1.2  Admitted to [facility], continued on CIWA. Had worsening withdrawal sx requiring increasing ativan doses. Worsening agitation. PCCM consulted for evaluation, ICU transfer and precedex initiation   Pertinent  Medical History  EtOH abuse HTN Thrombocytopenia  Cirrhosis with portal hypertension   Significant Hospital Events: Including procedures, antibiotic start and stop dates in addition to other pertinent events   9/29 admitted to [facility] for withdrawal. Worsening dt, transferred to ICU, PCCM consulted and started on dexmed 9/30  placed NGT lactulose. On low dose versed gtt and dexmed gtt.   10/2 added clonidine 10/3 added low dose librium  10/4 precedex off   Interim History / Subjective:  Still on precedex but weaning  Objective   Blood pressure (Abnormal) 166/81, pulse (Abnormal) 50, temperature 99.4 F (37.4 C), temperature source Axillary, resp. rate 16, height 5\' 6"  (1.676 m), weight 72.7 kg, SpO2 100 %.        Intake/Output Summary (Last 24 hours) at 01/03/2021  0914 Last data filed at 01/03/2021 0700 Gross per 24 hour  Intake 1772.85 ml  Output 1725 ml  Net 47.85 ml   Filed Weights   12/29/20 0527 01/01/21 0500  Weight: 76.2 kg 72.7 kg    General: Confused non-English-speaking male resting in bed currently speaking with him via interpreter HEENT normocephalic atraumatic no jugular venous distention appreciated Pulmonary: Clear to auscultation no accessory use currently on room air Cardiac: Regular rate and rhythm Abdomen: Soft nontender no organomegaly Extremities: Warm dry brisk cap refill Neuro: Awake oriented x2.  Cooperative. GU: Some hematuria after pulling the catheter out  Resolved Hospital Problem list     Assessment & Plan:   Acute encephalopathy, multifactorial 2/2 Hepatic encephalopathy as well as EtOH withdrawal/ with DTs and agitated delirium  -previous Dts course this year was prolonged  Plan Continue Librium taper Increase lactulose Discontinue Precedex Continue clonidine Continue thiamine and folate    Alcoholic cirrhosis w/ Portal hypertension & Hyperammonemia Elevated LFTs  Plan LFTs in a.m. Lactulose as above Needs outpatient MRI of liver, I think he needs to demonstrate he can stop drinking  Pancytopenia in setting of liver disease & alcoholic cirrhosis, portal hypertension >>> hypersplenism Appears to be stable . PLTs actually improving w/ no evidence of blood loss currently  Plan Check CBC today Continue SCDs, may be able to start subcu heparin  HTN  Plan Resume Coreg Continue clonidine As needed hydralazine  Constipation Plan Increasing lactulose  Fluid and electrolyte  imbalance: hypokalemia and hypomagnesemia  Plan Recheck potassium magnesium and blood chemistry today  Best Practice (right click and "Reselect all SmartList Selections" daily)   Diet/type: Regular consistency (see orders) DVT prophylaxis: SCD GI prophylaxis: PPI Lines: N/A Foley:  Yes, and it is no longer needed Code  Status:  full code Last date of multidisciplinary goals of care discussion [pending]  My cct 31 min   Simonne Martinet ACNP-BC [facility] Pulmonary/Critical Care Pager # [phone] OR # [phone] if no answer

## 2021-01-03 NOTE — Progress Notes (Signed)
Starting ctx for possible PNA and on going SIRS w/ volume responsive hypotension Pcxr pending   Simonne Martinet ACNP-BC [facility] Pulmonary/Critical Care Pager # [phone] OR # [phone] if no answer

## 2021-01-03 NOTE — Progress Notes (Signed)
This RN had received report from night shift RN that patient had pulled out foley catheter overnight. Patient has had significant sanguinous discharge from penis throughout shift; NP notified. At approximately 1300, patient became hypotensive, lowest reading 66/27 (39). MD made aware, new orders placed. Patient received 2L of LR H&H drawn. H&H came back unremarkable. At 1600 patient became febrile, 101.4 (tylenol given per order). At 1824 BP 77/52 (58). NP made aware, new orders placed. Per order, patient receiving bolus of LR and maintenance fluids of LR at 125ml/hr at this time. CXR, STAT lactate, blood cultures also ordered per Anders Simmonds, NP

## 2021-01-03 NOTE — Progress Notes (Signed)
At round 0350, nurse noted that the patient's heart rate was 110 (normally 58-70) on the cardiac monitor. Nurse went to room to assess patient. Nurse noted that the patient had discontinued his foley catheter. A fair amountof blood was noted at the meatus and on the pad located in the patient's bed under his buttocks. Nurse notified the charge nurse and the primary team via elink. At 0420 the nurse spoke with provider Valora Piccolo, MD in reference to patient. See MAR for new orders, patient will remain without foley catheter at  this time. Patient will be bladder Q8 per provider's verbal order. Will continue to monitor.

## 2021-01-03 NOTE — Progress Notes (Signed)
eLink Physician-Brief Progress Note Patient Name: [name] DOB: Jul 09, 1980 MRN: [id number]   Date of Service  01/03/2021  HPI/Events of Note  Lactic acid 3.5.  eICU Interventions  LR 500 ml iv bolus ordered, repeat Lactic acid at midnight.        Thomasene Lot [name] 01/03/2021, 9:17 PM

## 2021-01-03 NOTE — Progress Notes (Signed)
Nurse called to [facility] and spoke with Gretchin to follow up with patient's bladder scan. Nurse scanned patient twice first attempt was and 2nd was . There is bloody drainage noted at the meatus. No new orders at this time. Will continue to monitor.

## 2021-01-03 NOTE — Progress Notes (Signed)
Interval   Pulled foley out earlier had sig blood from penis and when voiding.  Later in day got hypotensive. H&H stable.  Received several bolus of crystalloid, stopped clonidine.  Over course of day bleeding from penis improved some.  Hemodynamics improved  He did spike fever  I sent blood cultures Will check cxr Also send PCT Wbc nml Will hold off on abx for now  Keep as SDU   Simonne Martinet ACNP-BC [facility] Pulmonary/Critical Care Pager # [phone] OR # [phone] if no answer

## 2021-01-03 NOTE — Progress Notes (Signed)
Before foley was d/c patient had a total of 500 ml urinary output record by nurse. Will continue to monitor.

## 2021-01-03 NOTE — Progress Notes (Signed)
eLink Physician-Brief Progress Note Patient Name: [name] DOB: [birth year] MRN: [id number]   Date of Service  01/03/2021  HPI/Events of Note  Notified that patient pulled out foley catheter while one restraint was off.  He has some blood around the urethra.   Pt is awake on camera assessment. He appears calm but intermittently is confused per RN.  He is on precedex gtt. BP 152/73, HR 62, RR 22, 100% O2 sats.   eICU Interventions  Monitor urine output.  Bladder scan q8hrs.  Atarax prn for anxiety.  Pt is eating and drinking some.  Crea has been stable. Ok to d/c IVFs.     Intervention Category Minor Interventions: Agitation / anxiety - evaluation and management  Larinda Buttery 01/03/2021, 4:36 AM

## 2021-01-04 ENCOUNTER — Telehealth: Payer: Self-pay | Admitting: Hematology

## 2021-01-04 DIAGNOSIS — D696 Thrombocytopenia, unspecified: Secondary | ICD-10-CM

## 2021-01-04 DIAGNOSIS — R7989 Other specified abnormal findings of blood chemistry: Secondary | ICD-10-CM

## 2021-01-04 LAB — CBC
HCT: 30.6 % — ABNORMAL LOW (ref 39.0–52.0)
Hemoglobin: 10.2 g/dL — ABNORMAL LOW (ref 13.0–17.0)
MCH: 32.1 pg (ref 26.0–34.0)
MCHC: 33.3 g/dL (ref 30.0–36.0)
MCV: 96.2 fL (ref 80.0–100.0)
Platelets: 88 10*3/uL — ABNORMAL LOW (ref 150–400)
RBC: 3.18 MIL/uL — ABNORMAL LOW (ref 4.22–5.81)
RDW: 13.5 % (ref 11.5–15.5)
WBC: 9.4 10*3/uL (ref 4.0–10.5)
nRBC: 0 % (ref 0.0–0.2)

## 2021-01-04 LAB — COMPREHENSIVE METABOLIC PANEL
ALT: 52 U/L — ABNORMAL HIGH (ref 0–44)
AST: 68 U/L — ABNORMAL HIGH (ref 15–41)
Albumin: 3 g/dL — ABNORMAL LOW (ref 3.5–5.0)
Alkaline Phosphatase: 118 U/L (ref 38–126)
Anion gap: 9 (ref 5–15)
BUN: 13 mg/dL (ref 6–20)
CO2: 21 mmol/L — ABNORMAL LOW (ref 22–32)
Calcium: 8.6 mg/dL — ABNORMAL LOW (ref 8.9–10.3)
Chloride: 102 mmol/L (ref 98–111)
Creatinine, Ser: 0.87 mg/dL (ref 0.61–1.24)
GFR, Estimated: >60 mL/min (ref >60)
Glucose, Bld: 123 mg/dL — ABNORMAL HIGH (ref 70–99)
Potassium: 3.9 mmol/L (ref 3.5–5.1)
Sodium: 132 mmol/L — ABNORMAL LOW (ref 135–145)
Total Bilirubin: 0.7 mg/dL (ref 0.3–1.2)
Total Protein: 6.3 g/dL — ABNORMAL LOW (ref 6.5–8.1)

## 2021-01-04 LAB — AMMONIA: Ammonia: 50 umol/L — ABNORMAL HIGH (ref 9–35)

## 2021-01-04 LAB — C DIFFICILE (CDIFF) QUICK SCRN (NO PCR REFLEX)
C Diff antigen: NEGATIVE
C Diff interpretation: NOT DETECTED
C Diff toxin: NEGATIVE

## 2021-01-04 LAB — PROCALCITONIN: Procalcitonin: 18.73 ng/mL

## 2021-01-04 LAB — LACTIC ACID, PLASMA: Lactic Acid, Venous: 2.3 mmol/L (ref 0.5–1.9)

## 2021-01-04 MED ORDER — AMLODIPINE BESYLATE 10 MG PO TABS
10.0000 mg | ORAL_TABLET | Freq: Every day | ORAL | Status: DC
  Administered 2021-01-04 – 2021-01-06 (×3): 10 mg via ORAL
  Filled 2021-01-04 (×3): qty 1

## 2021-01-04 MED ORDER — GUAIFENESIN-DM 100-10 MG/5ML PO SYRP
5.0000 mL | ORAL_SOLUTION | ORAL | Status: DC | PRN
  Administered 2021-01-04 – 2021-01-05 (×3): 5 mL via ORAL
  Filled 2021-01-04 (×3): qty 10

## 2021-01-04 MED ORDER — HYDRALAZINE HCL 20 MG/ML IJ SOLN
10.0000 mg | INTRAMUSCULAR | Status: DC | PRN
  Administered 2021-01-05 – 2021-01-06 (×3): 10 mg via INTRAVENOUS
  Filled 2021-01-04 (×3): qty 1

## 2021-01-04 NOTE — Telephone Encounter (Signed)
Called pt to sch new hem consultation per staff msg from Dr. Mosetta Putt. Pt is being referred by Dr. Audley Hose. No answer. Left msg for pt to call back to sch appt. Left my direct number for pt to contact.

## 2021-01-04 NOTE — Progress Notes (Signed)
eLink Physician-Brief Progress Note Patient Name: [name] DOB: 1980-06-19 MRN: [id number]   Date of Service  01/04/2021  HPI/Events of Note  Patient with elevated blood pressure.  eICU Interventions  PRN iv Hydralazine 10 mg Q 4 hours  for SBP > 160 mmHg.        [name] 01/04/2021, 4:11 AM

## 2021-01-04 NOTE — Progress Notes (Signed)
PROGRESS NOTE    [name]  NGE:[id number] DOB: January 19, 1981 DOA: 12/29/2020 PCP: Pcp, No   Brief Narrative:  40 yo M PMH EtOH abuse, HTN, presented to ED originally 9/28 due to abnormal lab: plt 32, and was dc home same day with rec for OP heme follow up. He did have CT a/p on 9/28 which revealed cirrhosis with portal hypertension. Pt returned to ED 9/29 AM with CC hallucinations. Last etOH intake was 9/27 evening per 9/28 ED note, but on 9/29 pt stated last drink 3wks prior to admission. In ED 9/29 pt hallucinating, oriented x2-3, shaking -- in overt alcohol withdrawal.  Initially requiring Precedex and followed in the ICU, transitioning to [facility] team on 01/04/2021 with profoundly improved symptoms but not yet resolved  9/29 admitted to [facility] for withdrawal. Worsening dt, transferred to ICU, PCCM consulted and started on dexmed 9/30  placed NGT lactulose. On low dose versed gtt and dexmed gtt.   10/2 added clonidine 10/3 added low dose librium  10/4 precedex off Fever -overnight, procalcitonin moderately elevated in the setting of presumed pneumonia, follow cultures   Assessment & Plan:    Sepsis secondary to presumed UTI versus aspiration pneumonia, likely POA  Rule out C. difficile -Patient admitted as below with encephalopathy, very poor historian, now having fever, mild hypoxia but unremarkable chest x-ray with elevated procalcitonin and leukocytosis concerning for infection meeting sepsis criteria given presumed source of pneumonia versus UTI -Continue broad-spectrum antibiotics with ceftriaxone -GI panel pending as well as C. difficile testing pending given leukocytosis, fever with profound watery diarrhea initiated overnight  Acute encephalopathy, multifactorial 2/2 Hepatic encephalopathy as well as EtOH withdrawal/ with DTs and agitated delirium  -Recurrent episode of alcohol withdrawal and Dts -Off Precedex continue Librium taper and clonidine as blood pressure  tolerates -Continue multivitamin -Lengthy discussion with patient about need for cessation given profound and recurrent symptoms, patient not willing to consider alcohol cessation at this point   Alcoholic cirrhosis w/ Portal hypertension & Hyperammonemia Elevated LFTs  -LFTs minimally elevated, unclear if acutely elevated or at baseline given patient has not had normal LFTs since 2021 -Would benefit from outpatient alcohol abuse program if agreeable to quit drinking which at this current moment patient does not feel that he needs to   Pancytopenia in setting of liver disease & alcoholic cirrhosis, portal hypertension, hypersplenism Appears to be stabilizing Platelets generally improving over the past 72 hours but somewhat labile Continue SCDs only, hold anticoagulation in the setting of thrombocytopenia   HTN  Coreg, amlodipine, clonidine discontinued given hypotension Restart amlodipine in the setting of hypertension, low threshold to discontinue   Constipation, resolved, diarrhea as above Continue lactulose   Fluid and electrolyte imbalance: hypokalemia and hypomagnesemia  Follow repeat labs   DVT prophylaxis: SCDs only given thrombocytopenia Code Status: Full Family Communication: None present  Status is: Inpatient  Dispo: The patient is from: Home              Anticipated d/c is to: Home              Anticipated d/c date is: 48 to 72-hour              Patient currently not medically stable for discharge  Consultants:  PCCM  Procedures:  None  Antimicrobials:  Ceftriaxone x5 days  Subjective: No acute issues or events overnight, patient threatening to leave AMA earlier this morning, ultimately elected to stay due to ongoing profound tremulousness and worsening diarrhea  Objective: Vitals:   01/04/21 0402 01/04/21 0501 01/04/21 0601 01/04/21 0700  BP: (!) 159/92 (!) 156/74 (!) 148/76 (!) 164/74  Pulse: 94 93 91 88  Resp: 17 20 15  (!) 25  Temp:      TempSrc:       SpO2: 100% 96% 99% 97%  Weight:      Height:        Intake/Output Summary (Last 24 hours) at 01/04/2021 0719 Last data filed at 01/04/2021 0503 Gross per 24 hour  Intake 5070.15 ml  Output 275 ml  Net 4795.15 ml   Filed Weights   12/29/20 0527 01/01/21 0500  Weight: 76.2 kg 72.7 kg    Examination:  General exam: Appears calm and comfortable  Respiratory system: Clear to auscultation. Respiratory effort normal. Cardiovascular system: S1 & S2 heard, RRR. No JVD, murmurs, rubs, gallops or clicks. No pedal edema. Gastrointestinal system: Abdomen is nondistended, soft and nontender. No organomegaly or masses felt. Normal bowel sounds heard. Central nervous system: Alert and oriented. No focal neurological deficits. Extremities: Symmetric 5 x 5 power. Skin: No rashes, lesions or ulcers Psychiatry: Judgement and insight appear normal. Mood & affect appropriate.     Data Reviewed: I have personally reviewed following labs and imaging studies  CBC: Recent Labs  Lab 12/28/20 1201 12/29/20 0600 12/30/20 0258 01/02/21 0645 01/03/21 1629 01/04/21 0011  WBC 3.3* 3.1* 3.3* 4.0 5.8 9.4  NEUTROABS 2.5 2.1 2.0  --   --   --   HGB 12.0* 11.5* 11.0* 10.9* 10.5* 10.2*  HCT 35.6* 34.1* 33.2* 32.0* 31.6* 30.6*  MCV 96.2 95.8 95.1 93.8 95.8 96.2  PLT 32* 32* 36* 67* 97* 88*   Basic Metabolic Panel: Recent Labs  Lab 12/29/20 1553 12/30/20 0258 12/31/20 0304 01/02/21 0645 01/03/21 0951 01/03/21 1346 01/04/21 0011  NA  --  140 141 131*  --  141 132*  K  --  3.4* 3.3* 3.2*  --  3.1* 3.9  CL  --  110 110 99  --  108 102  CO2  --  23 26 25   --  21* 21*  GLUCOSE  --  123* 128* 125*  --  126* 123*  BUN  --  10 12 <5*  --  12 13  CREATININE  --  0.40* 0.53* 0.36*  --  1.15 0.87  CALCIUM  --  9.2 8.9 8.2*  --  9.3 8.6*  MG 2.0  --   --  1.7 1.9  --   --   PHOS 3.9  --   --  3.0  --   --   --    GFR: Estimated Creatinine Clearance: 102.9 mL/min (by C-G formula based on SCr of  0.87 mg/dL). Liver Function Tests: Recent Labs  Lab 12/28/20 1201 12/29/20 0600 12/30/20 0258 12/31/20 0304 01/04/21 0011  AST 99* 72* 66* 47* 68*  ALT 71* 58* 52* 49* 52*  ALKPHOS 118 99 94 99 118  BILITOT 1.9* 1.4* 1.4* 1.3* 0.7  PROT 8.4* 7.9 7.6 7.7 6.3*  ALBUMIN 4.1 4.0 3.9 3.9 3.0*   No results for input(s): LIPASE, AMYLASE in the last 168 hours. Recent Labs  Lab 12/29/20 0601 12/31/20 0304 01/04/21 0011  AMMONIA 77* 58* 50*   Coagulation Profile: Recent Labs  Lab 12/28/20 1201 12/29/20 0600 12/30/20 0258 01/03/21 1353  INR 1.1 1.2 1.2 1.4*   Cardiac Enzymes: No results for input(s): CKTOTAL, CKMB, CKMBINDEX, TROPONINI in the last 168 hours. BNP (last 3 results) No  results for input(s): PROBNP in the last 8760 hours. HbA1C: No results for input(s): HGBA1C in the last 72 hours. CBG: Recent Labs  Lab 01/03/21 0324 01/03/21 0839 01/03/21 1229 01/03/21 1633 01/03/21 2027  GLUCAP 113* 114* 117* 106* 127*   Lipid Profile: No results for input(s): CHOL, HDL, LDLCALC, TRIG, CHOLHDL, LDLDIRECT in the last 72 hours. Thyroid Function Tests: No results for input(s): TSH, T4TOTAL, FREET4, T3FREE, THYROIDAB in the last 72 hours. Anemia Panel: No results for input(s): VITAMINB12, FOLATE, FERRITIN, TIBC, IRON, RETICCTPCT in the last 72 hours. Sepsis Labs: Recent Labs  Lab 01/03/21 1813 01/03/21 1900 01/04/21 0011  PROCALCITON 25.18  --  18.73  LATICACIDVEN  --  3.5* 2.3*    Recent Results (from the past 240 hour(s))  Resp Panel by RT-PCR (Flu A&B, Covid) Nasopharyngeal Swab     Status: None   Collection Time: 12/29/20  6:32 AM   Specimen: Nasopharyngeal Swab; Nasopharyngeal(NP) swabs in vial transport medium  Result Value Ref Range Status   SARS Coronavirus 2 by RT PCR NEGATIVE NEGATIVE Final    Comment: (NOTE) SARS-CoV-2 target nucleic acids are NOT DETECTED.  The SARS-CoV-2 RNA is generally detectable in upper respiratory specimens during the acute  phase of infection. The lowest concentration of SARS-CoV-2 viral copies this assay can detect is 138 copies/mL. A negative result does not preclude SARS-Cov-2 infection and should not be used as the sole basis for treatment or other patient management decisions. A negative result may occur with  improper specimen collection/handling, submission of specimen other than nasopharyngeal swab, presence of viral mutation(s) within the areas targeted by this assay, and inadequate number of viral copies(<138 copies/mL). A negative result must be combined with clinical observations, patient history, and epidemiological information. The expected result is Negative.  Fact Sheet for Patients:  BloggerCourse.com  Fact Sheet for Healthcare Providers:  SeriousBroker.it  This test is no t yet approved or cleared by the [place] FDA and  has been authorized for detection and/or diagnosis of SARS-CoV-2 by FDA under an Emergency Use Authorization (EUA). This EUA will remain  in effect (meaning this test can be used) for the duration of the COVID-19 declaration under Section 564(b)(1) of the Act, 21 U.S.C.section 360bbb-3(b)(1), unless the authorization is terminated  or revoked sooner.       Influenza A by PCR NEGATIVE NEGATIVE Final   Influenza B by PCR NEGATIVE NEGATIVE Final    Comment: (NOTE) The Xpert Xpress SARS-CoV-2/FLU/RSV plus assay is intended as an aid in the diagnosis of influenza from Nasopharyngeal swab specimens and should not be used as a sole basis for treatment. Nasal washings and aspirates are unacceptable for Xpert Xpress SARS-CoV-2/FLU/RSV testing.  Fact Sheet for Patients: BloggerCourse.com  Fact Sheet for Healthcare Providers: SeriousBroker.it  This test is not yet approved or cleared by the [place] FDA and has been authorized for detection and/or diagnosis of  SARS-CoV-2 by FDA under an Emergency Use Authorization (EUA). This EUA will remain in effect (meaning this test can be used) for the duration of the COVID-19 declaration under Section 564(b)(1) of the Act, 21 U.S.C. section 360bbb-3(b)(1), unless the authorization is terminated or revoked.  Performed at [facility], [address] [place], [place] [address]   MRSA Next Gen by PCR, Nasal     Status: None   Collection Time: 12/29/20 10:46 PM   Specimen: Nasal Mucosa; Nasal Swab  Result Value Ref Range Status   MRSA by PCR Next Gen NOT DETECTED  NOT DETECTED Final    Comment: (NOTE) The GeneXpert MRSA Assay (FDA approved for NASAL specimens only), is one component of a comprehensive MRSA colonization surveillance program. It is not intended to diagnose MRSA infection nor to guide or monitor treatment for MRSA infections. Test performance is not FDA approved in patients less than 73 years old. Performed at [facility], [address] [place], [place] [address]          Radiology Studies: DG Chest Port 1 View  Result Date: 01/03/2021 CLINICAL DATA:  Sudden onset fever and shortness of breath EXAM: PORTABLE CHEST 1 VIEW COMPARISON:  01/01/2021 FINDINGS: Cardiac shadow is enlarged but stable. The lungs are well aerated bilaterally. Previously seen feeding catheter has been removed in the interval. No bony abnormality is seen. IMPRESSION: No active disease. Electronically Signed   By: Alcide Clever M.D.   On: 01/03/2021 19:32        Scheduled Meds:  chlordiazePOXIDE  25 mg Oral TID   Followed by   chlordiazePOXIDE  25 mg Oral BH-qamhs   Followed by   Melene Muller ON 01/05/2021] chlordiazePOXIDE  25 mg Oral Daily   Chlorhexidine Gluconate Cloth  6 each Topical Daily   folic acid  1 mg Per Tube Daily   lactulose  30 g Oral TID   mouth rinse  15 mL Mouth Rinse BID   multivitamin with minerals  1 tablet Oral Daily   thiamine  100 mg Oral Daily    Continuous Infusions:  sodium chloride Stopped (01/03/21 1929)   cefTRIAXone (ROCEPHIN)  IV Stopped (01/03/21 2247)   lactated ringers Stopped (01/03/21 1833)   lactated ringers 125 mL/hr at 01/04/21 0503     LOS: 6 days   Time spent:  Azucena Fallen, DO [facility]  If 7PM-7AM, please contact night-coverage [email or web address]  01/04/2021, 7:19 AM

## 2021-01-05 ENCOUNTER — Telehealth: Payer: Self-pay | Admitting: Hematology

## 2021-01-05 LAB — COMPREHENSIVE METABOLIC PANEL
ALT: 44 U/L (ref 0–44)
AST: 40 U/L (ref 15–41)
Albumin: 3 g/dL — ABNORMAL LOW (ref 3.5–5.0)
Alkaline Phosphatase: 106 U/L (ref 38–126)
Anion gap: 7 (ref 5–15)
BUN: 6 mg/dL (ref 6–20)
CO2: 22 mmol/L (ref 22–32)
Calcium: 8.4 mg/dL — ABNORMAL LOW (ref 8.9–10.3)
Chloride: 104 mmol/L (ref 98–111)
Creatinine, Ser: 0.4 mg/dL — ABNORMAL LOW (ref 0.61–1.24)
GFR, Estimated: >60 mL/min (ref >60)
Glucose, Bld: 121 mg/dL — ABNORMAL HIGH (ref 70–99)
Potassium: 2.8 mmol/L — ABNORMAL LOW (ref 3.5–5.1)
Sodium: 133 mmol/L — ABNORMAL LOW (ref 135–145)
Total Bilirubin: 0.6 mg/dL (ref 0.3–1.2)
Total Protein: 6.1 g/dL — ABNORMAL LOW (ref 6.5–8.1)

## 2021-01-05 LAB — CBC
HCT: 30.7 % — ABNORMAL LOW (ref 39.0–52.0)
Hemoglobin: 10.1 g/dL — ABNORMAL LOW (ref 13.0–17.0)
MCH: 31.5 pg (ref 26.0–34.0)
MCHC: 32.9 g/dL (ref 30.0–36.0)
MCV: 95.6 fL (ref 80.0–100.0)
Platelets: 102 10*3/uL — ABNORMAL LOW (ref 150–400)
RBC: 3.21 MIL/uL — ABNORMAL LOW (ref 4.22–5.81)
RDW: 13.3 % (ref 11.5–15.5)
WBC: 8.6 10*3/uL (ref 4.0–10.5)
nRBC: 0 % (ref 0.0–0.2)

## 2021-01-05 LAB — GASTROINTESTINAL PANEL BY PCR, STOOL (REPLACES STOOL CULTURE)

## 2021-01-05 LAB — PROCALCITONIN: Procalcitonin: 4.96 ng/mL

## 2021-01-05 MED ORDER — POTASSIUM CHLORIDE CRYS ER 20 MEQ PO TBCR
40.0000 meq | EXTENDED_RELEASE_TABLET | Freq: Two times a day (BID) | ORAL | Status: DC
  Administered 2021-01-05: 40 meq via ORAL
  Filled 2021-01-05: qty 2

## 2021-01-05 MED ORDER — POTASSIUM CHLORIDE CRYS ER 20 MEQ PO TBCR
40.0000 meq | EXTENDED_RELEASE_TABLET | Freq: Two times a day (BID) | ORAL | Status: AC
  Administered 2021-01-05: 40 meq via ORAL
  Filled 2021-01-05: qty 2

## 2021-01-05 MED ORDER — ACETAMINOPHEN 160 MG/5ML PO SOLN
650.0000 mg | Freq: Four times a day (QID) | ORAL | Status: DC | PRN
  Administered 2021-01-05: 650 mg via ORAL
  Filled 2021-01-05: qty 20.3

## 2021-01-05 NOTE — Telephone Encounter (Signed)
Called pt to sch new hem appt per 9/28 staff msg from Dr. Mosetta Putt. I spoke to pt's daughter who said that pt is in the hospital and she did not want to schedule any appts for him until he was out. She said she was not sure when he would be discharged. I gave her my direct number to call to sch this new hem appt once he has been discharged from the hospital.

## 2021-01-05 NOTE — Progress Notes (Signed)
PROGRESS NOTE    [name]  QMV:[id number] DOB: 1980/07/09 DOA: 12/29/2020 PCP: Pcp, No   Brief Narrative:  40 yo M PMH EtOH abuse, HTN, presented to ED originally 9/28 due to abnormal lab: plt 32, and was dc home same day with rec for OP heme follow up. He did have CT a/p on 9/28 which revealed cirrhosis with portal hypertension. Pt returned to ED 9/29 AM with CC hallucinations. Last etOH intake was 9/27 evening per 9/28 ED note, but on 9/29 pt stated last drink 3wks prior to admission. In ED 9/29 pt hallucinating, oriented x2-3, shaking -- in overt alcohol withdrawal.  Initially requiring Precedex and followed in the ICU, transitioning to [facility] team on 01/04/2021 with profoundly improved symptoms but not yet resolved  9/29 admitted to [facility] for withdrawal. Worsening dt, transferred to ICU, PCCM consulted and started on dexmed 9/30  placed NGT lactulose. On low dose versed gtt and dexmed gtt.   10/2 added clonidine/Librium taper 10/4 precedex off 10/5 Fever -overnight, procalcitonin moderately elevated in the setting of presumed pneumonia, follow cultures - threatening to leave AMA but was convinced to stay for ongoing treatment  Assessment & Plan:   Sepsis secondary to presumed UTI versus pneumonia, likely POA  C. difficile ruled out, EPEC positive, POA - Diarrhea resolving, GI panel positive for EPEC - continue supportive care - Burning urination improving with antibiotics, unclear if related to incidental Foley tube removal versus infection - Continue broad-spectrum antibiotics with ceftriaxone - Procalcitonin downtrending appropriately; patient afebrile over the past 24 hours  Acute encephalopathy, multifactorial 2/2 hepatic encephalopathy as well as EtOH withdrawal/ with DTs and agitated delirium  - Recurrent episode of alcohol withdrawal and Dts - Continue Librium taper and clonidine as blood pressure tolerates - Continue multivitamin - Lengthy discussion with patient  about need for cessation given profound and recurrent symptoms, patient now willing to consider alcohol cessation at discharge   Alcoholic cirrhosis w/ Portal hypertension & Hyperammonemia Elevated LFTs  - LFTs minimally elevated, unclear if acutely elevated or at baseline given patient has not had normal LFTs since 2021 - Would benefit from outpatient alcohol abuse program if agreeable -Continue lactulose   Pancytopenia in setting of liver disease & alcoholic cirrhosis, portal hypertension, hypersplenism - Appears to be stabilizing - Platelets generally improving over the past 72 hours but somewhat labile Continue SCDs only, hold anticoagulation in the setting of thrombocytopenia   HTN  - Coreg, amlodipine, clonidine discontinued given hypotension at intake - Continue amlodipine given blood pressure now increasing back to previous baseline   Fluid and electrolyte imbalance: hypokalemia and hypomagnesemia  - Follow repeat labs   DVT prophylaxis: SCDs only given thrombocytopenia Code Status: Full Family Communication: None present  Status is: Inpatient  Dispo: The patient is from: Home              Anticipated d/c is to: Home              Anticipated d/c date is: 48 to 72-hour              Patient currently not medically stable for discharge  Consultants:  PCCM  Procedures:  None  Antimicrobials:  Ceftriaxone x5 days  Subjective: No acute issues or events overnight, still quite tremulous at rest, has not attempted to ambulate since admission due to weakness and instability.  Objective: Vitals:   01/05/21 0412 01/05/21 0436 01/05/21 0505 01/05/21 0600  BP: (!) 172/89 (!) 170/81 (!) 156/74 (!) 151/73  Pulse: 79  80 75  Resp: 18  (!) 23 15  Temp:      TempSrc:      SpO2: 97%  98% 97%  Weight:      Height:        Intake/Output Summary (Last 24 hours) at 01/05/2021 0719 Last data filed at 01/05/2021 3267 Gross per 24 hour  Intake 3432.73 ml  Output 1325 ml  Net  2107.73 ml    Filed Weights   12/29/20 0527 01/01/21 0500  Weight: 76.2 kg 72.7 kg    Examination:  General exam: Appears calm and comfortable  Respiratory system: Clear to auscultation. Respiratory effort normal. Cardiovascular system: S1 & S2 heard, RRR. No JVD, murmurs, rubs, gallops or clicks. No pedal edema. Gastrointestinal system: Abdomen is nondistended, soft and nontender. No organomegaly or masses felt. Normal bowel sounds heard. Central nervous system: Alert and oriented.  Resting tremors noted diffusely, worse with intention Extremities: Symmetric 5 x 5 power. Skin: No rashes, lesions or ulcers Psychiatry: Judgement and insight appear normal. Mood & affect appropriate.   Data Reviewed: I have personally reviewed following labs and imaging studies  CBC: Recent Labs  Lab 12/30/20 0258 01/02/21 0645 01/03/21 1629 01/04/21 0011 01/05/21 0303  WBC 3.3* 4.0 5.8 9.4 8.6  NEUTROABS 2.0  --   --   --   --   HGB 11.0* 10.9* 10.5* 10.2* 10.1*  HCT 33.2* 32.0* 31.6* 30.6* 30.7*  MCV 95.1 93.8 95.8 96.2 95.6  PLT 36* 67* 97* 88* 102*    Basic Metabolic Panel: Recent Labs  Lab 12/29/20 1553 12/30/20 0258 12/31/20 0304 01/02/21 0645 01/03/21 0951 01/03/21 1346 01/04/21 0011 01/05/21 0303  NA  --    < > 141 131*  --  141 132* 133*  K  --    < > 3.3* 3.2*  --  3.1* 3.9 2.8*  CL  --    < > 110 99  --  108 102 104  CO2  --    < > 26 25  --  21* 21* 22  GLUCOSE  --    < > 128* 125*  --  126* 123* 121*  BUN  --    < > 12 <5*  --  12 13 6   CREATININE  --    < > 0.53* 0.36*  --  1.15 0.87 0.40*  CALCIUM  --    < > 8.9 8.2*  --  9.3 8.6* 8.4*  MG 2.0  --   --  1.7 1.9  --   --   --   PHOS 3.9  --   --  3.0  --   --   --   --    < > = values in this interval not displayed.    GFR: Estimated Creatinine Clearance: 111.9 mL/min (A) (by C-G formula based on SCr of 0.4 mg/dL (L)). Liver Function Tests: Recent Labs  Lab 12/30/20 0258 12/31/20 0304 01/04/21 0011  01/05/21 0303  AST 66* 47* 68* 40  ALT 52* 49* 52* 44  ALKPHOS 94 99 118 106  BILITOT 1.4* 1.3* 0.7 0.6  PROT 7.6 7.7 6.3* 6.1*  ALBUMIN 3.9 3.9 3.0* 3.0*    No results for input(s): LIPASE, AMYLASE in the last 168 hours. Recent Labs  Lab 12/31/20 0304 01/04/21 0011  AMMONIA 58* 50*    Coagulation Profile: Recent Labs  Lab 12/30/20 0258 01/03/21 1353  INR 1.2 1.4*    Cardiac Enzymes: No results for  input(s): CKTOTAL, CKMB, CKMBINDEX, TROPONINI in the last 168 hours. BNP (last 3 results) No results for input(s): PROBNP in the last 8760 hours. HbA1C: No results for input(s): HGBA1C in the last 72 hours. CBG: Recent Labs  Lab 01/03/21 0324 01/03/21 0839 01/03/21 1229 01/03/21 1633 01/03/21 2027  GLUCAP 113* 114* 117* 106* 127*    Lipid Profile: No results for input(s): CHOL, HDL, LDLCALC, TRIG, CHOLHDL, LDLDIRECT in the last 72 hours. Thyroid Function Tests: No results for input(s): TSH, T4TOTAL, FREET4, T3FREE, THYROIDAB in the last 72 hours. Anemia Panel: No results for input(s): VITAMINB12, FOLATE, FERRITIN, TIBC, IRON, RETICCTPCT in the last 72 hours. Sepsis Labs: Recent Labs  Lab 01/03/21 1813 01/03/21 1900 01/04/21 0011 01/05/21 0303  PROCALCITON 25.18  --  18.73 4.96  LATICACIDVEN  --  3.5* 2.3*  --      Recent Results (from the past 240 hour(s))  Resp Panel by RT-PCR (Flu A&B, Covid) Nasopharyngeal Swab     Status: None   Collection Time: 12/29/20  6:32 AM   Specimen: Nasopharyngeal Swab; Nasopharyngeal(NP) swabs in vial transport medium  Result Value Ref Range Status   SARS Coronavirus 2 by RT PCR NEGATIVE NEGATIVE Final    Comment: (NOTE) SARS-CoV-2 target nucleic acids are NOT DETECTED.  The SARS-CoV-2 RNA is generally detectable in upper respiratory specimens during the acute phase of infection. The lowest concentration of SARS-CoV-2 viral copies this assay can detect is 138 copies/mL. A negative result does not preclude  SARS-Cov-2 infection and should not be used as the sole basis for treatment or other patient management decisions. A negative result may occur with  improper specimen collection/handling, submission of specimen other than nasopharyngeal swab, presence of viral mutation(s) within the areas targeted by this assay, and inadequate number of viral copies(<138 copies/mL). A negative result must be combined with clinical observations, patient history, and epidemiological information. The expected result is Negative.  Fact Sheet for Patients:  BloggerCourse.com  Fact Sheet for Healthcare Providers:  SeriousBroker.it  This test is no t yet approved or cleared by the [place] FDA and  has been authorized for detection and/or diagnosis of SARS-CoV-2 by FDA under an Emergency Use Authorization (EUA). This EUA will remain  in effect (meaning this test can be used) for the duration of the COVID-19 declaration under Section 564(b)(1) of the Act, 21 U.S.C.section 360bbb-3(b)(1), unless the authorization is terminated  or revoked sooner.       Influenza A by PCR NEGATIVE NEGATIVE Final   Influenza B by PCR NEGATIVE NEGATIVE Final    Comment: (NOTE) The Xpert Xpress SARS-CoV-2/FLU/RSV plus assay is intended as an aid in the diagnosis of influenza from Nasopharyngeal swab specimens and should not be used as a sole basis for treatment. Nasal washings and aspirates are unacceptable for Xpert Xpress SARS-CoV-2/FLU/RSV testing.  Fact Sheet for Patients: BloggerCourse.com  Fact Sheet for Healthcare Providers: SeriousBroker.it  This test is not yet approved or cleared by the [place] FDA and has been authorized for detection and/or diagnosis of SARS-CoV-2 by FDA under an Emergency Use Authorization (EUA). This EUA will remain in effect (meaning this test can be used) for the duration of  the COVID-19 declaration under Section 564(b)(1) of the Act, 21 U.S.C. section 360bbb-3(b)(1), unless the authorization is terminated or revoked.  Performed at [facility], [address] [place], [place] [address]   MRSA Next Gen by PCR, Nasal     Status: None   Collection Time: 12/29/20 10:46  PM   Specimen: Nasal Mucosa; Nasal Swab  Result Value Ref Range Status   MRSA by PCR Next Gen NOT DETECTED NOT DETECTED Final    Comment: (NOTE) The GeneXpert MRSA Assay (FDA approved for NASAL specimens only), is one component of a comprehensive MRSA colonization surveillance program. It is not intended to diagnose MRSA infection nor to guide or monitor treatment for MRSA infections. Test performance is not FDA approved in patients less than 21 years old. Performed at [facility], [address] [place], [place] [address]   Culture, blood (Routine X 2) w Reflex to ID Panel     Status: None (Preliminary result)   Collection Time: 01/03/21  6:07 PM   Specimen: BLOOD RIGHT FOREARM  Result Value Ref Range Status   Specimen Description   Final    BLOOD RIGHT FOREARM Performed at [facility], [address], [place], [place] [address]    Special Requests   Final    BOTTLES DRAWN AEROBIC ONLY Blood Culture adequate volume Performed at [facility], [address], [place], [place] [address]    Culture   Final    NO GROWTH < 12 HOURS Performed at [facility], [address], [place], [place] [address]    Report Status PENDING  Incomplete  Culture, blood (Routine X 2) w Reflex to ID Panel     Status: None (Preliminary result)   Collection Time: 01/03/21  6:13 PM   Specimen: BLOOD RIGHT HAND  Result Value Ref Range Status   Specimen Description   Final    BLOOD RIGHT HAND Performed at [facility], [address] [place], [place] [address]    Special Requests   Final    BOTTLES  DRAWN AEROBIC ONLY Blood Culture adequate volume Performed at [facility], [address], [place], [place] [address]    Culture   Final    NO GROWTH < 12 HOURS Performed at [facility], [address] [place], [place] [address]    Report Status PENDING  Incomplete  C Difficile Quick Screen (NO PCR Reflex)     Status: None   Collection Time: 01/04/21  3:44 PM   Specimen: STOOL  Result Value Ref Range Status   C Diff antigen NEGATIVE NEGATIVE Final   C Diff toxin NEGATIVE NEGATIVE Final   C Diff interpretation No C. difficile detected.  Final    Comment: Performed at [facility], [address] [place], [place] [address]          Radiology Studies: DG Chest Port 1 View  Result Date: 01/03/2021 CLINICAL DATA:  Sudden onset fever and shortness of breath EXAM: PORTABLE CHEST 1 VIEW COMPARISON:  01/01/2021 FINDINGS: Cardiac shadow is enlarged but stable. The lungs are well aerated bilaterally. Previously seen feeding catheter has been removed in the interval. No bony abnormality is seen. IMPRESSION: No active disease. Electronically Signed   By: Alcide Clever M.D.   On: 01/03/2021 19:32        Scheduled Meds:  amLODipine  10 mg Oral Daily   chlordiazePOXIDE  25 mg Oral [id number]   Followed by   chlordiazePOXIDE  25 mg Oral Daily   Chlorhexidine Gluconate Cloth  6 each Topical Daily   folic acid  1 mg Per Tube Daily   lactulose  30 g Oral TID   mouth rinse  15 mL Mouth Rinse BID   multivitamin with  minerals  1 tablet Oral Daily   potassium chloride  40 mEq Oral BID   thiamine  100 mg Oral Daily   Continuous Infusions:  sodium chloride Stopped (01/03/21 1929)   cefTRIAXone (ROCEPHIN)  IV Stopped (01/04/21 2107)   lactated ringers Stopped (01/03/21 1833)   lactated ringers 125 mL/hr at 01/05/21 0712     LOS: 7 days   Time spent:  Azucena Fallen, DO [facility]  If 7PM-7AM, please contact  night-coverage [email or web address]  01/05/2021, 7:19 AM

## 2021-01-05 NOTE — Progress Notes (Signed)
Lab called to report GI panel detected E-coli. Infection prevention was contacted to see if any special precautions need to be taken. Standard precautions are all we need to follow per infection prevention.

## 2021-01-06 LAB — CBC
HCT: 31.2 % — ABNORMAL LOW (ref 39.0–52.0)
Hemoglobin: 10.2 g/dL — ABNORMAL LOW (ref 13.0–17.0)
MCH: 31.3 pg (ref 26.0–34.0)
MCHC: 32.7 g/dL (ref 30.0–36.0)
MCV: 95.7 fL (ref 80.0–100.0)
Platelets: 121 10*3/uL — ABNORMAL LOW (ref 150–400)
RBC: 3.26 MIL/uL — ABNORMAL LOW (ref 4.22–5.81)
RDW: 13.6 % (ref 11.5–15.5)
WBC: 6.2 10*3/uL (ref 4.0–10.5)
nRBC: 0 % (ref 0.0–0.2)

## 2021-01-06 LAB — COMPREHENSIVE METABOLIC PANEL
ALT: 46 U/L — ABNORMAL HIGH (ref 0–44)
AST: 42 U/L — ABNORMAL HIGH (ref 15–41)
Albumin: 3.2 g/dL — ABNORMAL LOW (ref 3.5–5.0)
Alkaline Phosphatase: 102 U/L (ref 38–126)
Anion gap: 8 (ref 5–15)
BUN: <5 mg/dL — ABNORMAL LOW (ref 6–20)
CO2: 23 mmol/L (ref 22–32)
Calcium: 8.6 mg/dL — ABNORMAL LOW (ref 8.9–10.3)
Chloride: 108 mmol/L (ref 98–111)
Creatinine, Ser: 0.44 mg/dL — ABNORMAL LOW (ref 0.61–1.24)
GFR, Estimated: >60 mL/min (ref >60)
Glucose, Bld: 112 mg/dL — ABNORMAL HIGH (ref 70–99)
Potassium: 3.2 mmol/L — ABNORMAL LOW (ref 3.5–5.1)
Sodium: 139 mmol/L (ref 135–145)
Total Bilirubin: 0.5 mg/dL (ref 0.3–1.2)
Total Protein: 6.6 g/dL (ref 6.5–8.1)

## 2021-01-06 MED ORDER — ADULT MULTIVITAMIN W/MINERALS CH: 1.0000 | ORAL_TABLET | Freq: Every day | ORAL | 1 refills | Status: AC

## 2021-01-06 MED ORDER — CEPHALEXIN 250 MG PO CAPS: 250.0000 mg | ORAL_CAPSULE | Freq: Four times a day (QID) | ORAL | 0 refills | Status: AC

## 2021-01-06 MED ORDER — HYDROXYZINE HCL 10 MG PO TABS: 10.0000 mg | ORAL_TABLET | Freq: Three times a day (TID) | ORAL | 0 refills | Status: AC | PRN

## 2021-01-06 MED ORDER — THIAMINE HCL 100 MG PO TABS: 100.0000 mg | ORAL_TABLET | Freq: Every day | ORAL | 1 refills | Status: AC

## 2021-01-06 MED ORDER — FOLIC ACID 1 MG PO TABS: 1.0000 mg | ORAL_TABLET | Freq: Every day | ORAL | 1 refills | Status: AC

## 2021-01-06 MED ORDER — AMLODIPINE BESYLATE 10 MG PO TABS: 10.0000 mg | ORAL_TABLET | Freq: Every day | ORAL | 3 refills | Status: AC

## 2021-01-06 NOTE — Progress Notes (Signed)
AVS instructions were reviewed with patient and family at bedside. All questions were answered, all personal belongings returned.   Patient verbalizes understanding.

## 2021-01-06 NOTE — Discharge Summary (Signed)
Physician Discharge Summary  Haitham Dolinsky NWG:[id number] DOB: 05/24/1980 DOA: 12/29/2020  PCP: Pcp, No  Admit date: 12/29/2020 Discharge date: 01/06/2021  Admitted From: Home Disposition: Home  Recommendations for Outpatient Follow-up:  Follow up with PCP in 1-2 weeks Please obtain BMP/CBC in one week  Discharge Condition: Stable CODE STATUS: Full Diet recommendation: Low-salt low-fat diet  Brief/Interim Summary: 40 yo M PMH EtOH abuse, HTN, presented to ED originally 9/28 due to abnormal lab: plt 32, and was dc home same day with rec for OP heme follow up. He did have CT a/p on 9/28 which revealed cirrhosis with portal hypertension. Pt returned to ED 9/29 AM with CC hallucinations. Last etOH intake was 9/27 evening per 9/28 ED note, but on 9/29 pt stated last drink 3wks prior to admission. In ED 9/29 pt hallucinating, oriented x2-3, shaking -- in overt alcohol withdrawal.  Initially requiring Precedex and followed in the ICU -transition to [facility] team given improving symptoms.   9/29 admitted to [facility] for withdrawal. Worsening dt, transferred to ICU, PCCM consulted and started on dexmed 9/30  placed NGT lactulose. On low dose versed gtt and dexmed gtt.   10/2 added clonidine/Librium taper 10/4 precedex off 10/5 Fever -overnight, procalcitonin moderately elevated in the setting of presumed pneumonia, follow cultures - threatening to leave AMA but was convinced to stay for ongoing treatment Afebrile for 48 hours, symptoms improving otherwise stable and agreeable for discharge   Assessment & Plan:   Sepsis secondary to presumed UTI versus pneumonia, likely POA, resolving C. difficile ruled out, EPEC positive, POA - Diarrhea resolving, GI panel positive for EPEC - continue supportive care - Burning urination improving with antibiotics, unclear if related to incidental Foley tube removal versus infection - Continue broad-spectrum antibiotics with cephalexin to complete 5-day  course - Procalcitonin downtrending appropriately; patient afebrile over the past 24 hours   Acute encephalopathy, multifactorial 2/2 hepatic encephalopathy as well as EtOH withdrawal/ with DTs and agitated delirium, resolved - Recurrent episode of alcohol withdrawal and Dts -Patient able to successfully wean off of Librium taper and clonidine, will continue hydroxyzine as needed for ongoing anxiety, recommend close follow-up with PCP as well as for alcohol cessation programs as discussed at bedside, patient indicates he is willing to discontinue alcohol use at this point given his multiple recurrent admissions for withdrawal. 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 Elevated LFTs  - LFTs minimally elevated, unclear if acutely elevated or at baseline given patient has not had normal LFTs since 2021 -outpatient follow-up in the next 4 to 6 weeks to check for base level labs - Would benefit from outpatient alcohol abuse program if agreeable   Pancytopenia in setting of liver disease & alcoholic cirrhosis, portal hypertension, hypersplenism - Appears to be stabilizing - Platelets generally improving over the past 72 hours but somewhat labile Continue SCDs only, hold anticoagulation in the setting of thrombocytopenia   HTN  - Coreg, amlodipine, clonidine discontinued given hypotension at intake - Continue amlodipine only given previous hypotension, discontinue all other blood pressure medications   Fluid and electrolyte imbalance: hypokalemia and hypomagnesemia  -Repeat labs with PCP in the next few weeks, currently improving with p.o. intake  Discharge Instructions  Discharge Instructions     Diet - low sodium heart healthy   Complete by: As  directed    Increase activity slowly   Complete by: As directed    No wound care   Complete by: As directed       Allergies as of 01/06/2021       Reactions   Penicillins Swelling   Per his wife: eyes swelled shut.        Medication List     STOP taking these medications    carvedilol 6.25 MG tablet Commonly known as: COREG   cyclobenzaprine 10 MG tablet Commonly known as: FLEXERIL   lisinopril 10 MG tablet Commonly known as: ZESTRIL   QUEtiapine 25 MG tablet Commonly known as: SEROQUEL       TAKE these medications    amLODipine 10 MG tablet Commonly known as: NORVASC Take 1 tablet (10 mg total) by mouth daily.   cephALEXin 250 MG capsule Commonly known as: KEFLEX Take 1 capsule (250 mg total) by mouth 4 (four) times daily for 5 days.   folic acid 1 MG tablet Commonly known as: FOLVITE Take 1 tablet (1 mg total) by mouth daily.   hydrOXYzine 10 MG tablet Commonly known as: ATARAX/VISTARIL Take 1 tablet (10 mg total) by mouth 3 (three) times daily as needed for anxiety.   indomethacin 50 MG capsule Commonly known as: INDOCIN Take 50 mg by mouth 2 (two) times daily as needed for mild pain.   multivitamin with minerals Tabs tablet Take 1 tablet by mouth daily.   thiamine 100 MG tablet Take 1 tablet (100 mg total) by mouth daily.        Allergies  Allergen Reactions   Penicillins Swelling    Per his wife: eyes swelled shut.    Consultations: ICU/PCCM  Procedures/Studies: DG Abd 1 View  Result Date: 12/30/2020 CLINICAL DATA:  Encounter for feeding tube placement. EXAM: ABDOMEN - 1 VIEW COMPARISON:  09/29/2020 FINDINGS: Feeding tube extends down the esophagus and terminates in the left upper abdomen. The tip is in the gastric body region. Visualized lungs are clear. Heart size is stable. IMPRESSION: Feeding tube tip in the left upper abdomen and stomach body region. Electronically Signed   By: Richarda Overlie M.D.   On: 12/30/2020 10:18   CT  Head Wo Contrast  Result Date: 12/29/2020 CLINICAL DATA:  40 year old male with hallucinations, delirium. Thrombocytopenia. EXAM: CT HEAD WITHOUT CONTRAST TECHNIQUE: Contiguous axial images were obtained from the base of the skull through the vertex without intravenous contrast. COMPARISON:  Head CT 11/11/2019. FINDINGS: Brain: Cerebral volume is stable and within normal limits. No midline shift, ventriculomegaly, mass effect, evidence of mass lesion, intracranial hemorrhage or evidence of cortically based acute infarction. Gray-white matter differentiation is within normal limits throughout the brain. Vascular: No suspicious intracranial vascular hyperdensity. Skull: Negative aside from stable asymmetric positioning of the right TMJ. Subtle right mandible condyle anterior subluxation. Sinuses/Orbits: Visualized paranasal sinuses and mastoids are stable and well aerated. Other: Visualized orbits and scalp soft tissues are within normal limits. IMPRESSION: Stable and normal noncontrast CT appearance of the brain. Electronically Signed   By: Odessa Fleming M.D.   On: 12/29/2020 07:35   CT Abdomen Pelvis W Contrast  Result Date: 12/28/2020 CLINICAL DATA:  Acute nonlocalized abdominal pain. EXAM: CT ABDOMEN AND PELVIS WITH CONTRAST TECHNIQUE: Multidetector CT imaging of the abdomen and pelvis was performed using the standard protocol following bolus administration of intravenous contrast. CONTRAST:  80mL OMNIPAQUE IOHEXOL 350 MG/ML SOLN COMPARISON:  None. FINDINGS: Lower chest: No acute abnormality. Hepatobiliary: Nodular hepatic contour. The  hepatic parenchyma is diffusely hypodense compared to the splenic parenchyma consistent with fatty infiltration. No focal liver abnormality. Nonspecific gallbladder wall thickening which can be seen in the setting of chronic liver changes. No gallstones or pericholecystic fluid. No biliary dilatation. Pancreas: No focal lesion. Normal pancreatic contour. No surrounding inflammatory  changes. No main pancreatic ductal dilatation. Spleen: The spleen is enlarged measuring up to 15 cm. No focal splenic lesion. Adrenals/Urinary Tract: No adrenal nodule bilaterally. Bilateral kidneys enhance symmetrically. Subcentimeter hypodensities are too small to characterize. No hydronephrosis. No hydroureter. The urinary bladder is unremarkable. Stomach/Bowel: Stomach is within normal limits. No evidence of bowel wall thickening or dilatation. Appendix appears normal. 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 The main portal, splenic, superior mesenteric veins are patent. No abdominal aorta or iliac aneurysm. No abdominal, pelvic, or inguinal lymphadenopathy. Reproductive: Prostate is unremarkable. Other: No intraperitoneal free fluid. No intraperitoneal free gas. No organized fluid collection. Musculoskeletal: No abdominal wall hernia or abnormality. No suspicious lytic or blastic osseous lesions. No acute displaced fracture. Multilevel degenerative changes of the spine. IMPRESSION: Cirrhosis with portal hypertension. Recommend nonemergent MRI liver protocol for further evaluation. When the patient is clinically stable and able to follow directions and hold their breath (preferably as an outpatient) further evaluation with dedicated abdominal MRI should be considered. Electronically Signed   By: Tish Frederickson M.D.   On: 12/28/2020 15:54   DG Chest Port 1 View  Result Date: 01/03/2021 CLINICAL DATA:  Sudden onset fever and shortness of breath EXAM: PORTABLE CHEST 1 VIEW COMPARISON:  01/01/2021 FINDINGS: Cardiac shadow is enlarged but stable. The lungs are well aerated bilaterally. Previously seen feeding catheter has been removed in the interval. No bony abnormality is seen. IMPRESSION: No active disease. Electronically Signed   By: Alcide Clever M.D.   On: 01/03/2021 19:32   DG CHEST PORT 1 VIEW  Result Date: 01/01/2021 CLINICAL DATA:  Delirium tremens. EXAM: PORTABLE CHEST 1 VIEW  COMPARISON:  September 29, 2020 FINDINGS: A feeding tube is been placed, terminating in the stomach. The heart, hila, mediastinum, lungs, and pleura are otherwise unremarkable. IMPRESSION: A newly face feeding tube terminates in the stomach. Recommend advancement before use. Electronically Signed   By: Gerome Sam III M.D.   On: 01/01/2021 08:19    Subjective: No acute issues or events overnight  Discharge Exam: Vitals:   01/06/21 1100 01/06/21 1200  BP: (!) 163/62 (!) 160/77  Pulse: 93   Resp: (!) 21 (!) 23  Temp:  97.7 F (36.5 C)  SpO2: 100%    Vitals:   01/06/21 0900 01/06/21 1000 01/06/21 1100 01/06/21 1200  BP: (!) 153/68  (!) 163/62 (!) 160/77  Pulse: 79 87 93   Resp: 17 18 (!) 21 (!) 23  Temp:    97.7 F (36.5 C)  TempSrc:    Oral  SpO2: 99% 99% 100%   Weight:      Height:        General: Pt is alert, awake, not in acute distress Cardiovascular: RRR, S1/S2 +, no rubs, no gallops Respiratory: CTA bilaterally, no wheezing, no rhonchi Abdominal: Soft, NT, ND, bowel sounds + Extremities: no edema, no cyanosis   The results of significant diagnostics from this hospitalization (including imaging, microbiology, ancillary and laboratory) are listed below for reference.     Microbiology: Recent Results (from the past 240 hour(s))  Resp Panel by RT-PCR (Flu A&B, Covid) Nasopharyngeal Swab     Status: None   Collection Time: 12/29/20  6:32  AM   Specimen: Nasopharyngeal Swab; Nasopharyngeal(NP) swabs in vial transport medium  Result Value Ref Range Status   SARS Coronavirus 2 by RT PCR NEGATIVE NEGATIVE Final    Comment: (NOTE) SARS-CoV-2 target nucleic acids are NOT DETECTED.  The SARS-CoV-2 RNA is generally detectable in upper respiratory specimens during the acute phase of infection. The lowest concentration of SARS-CoV-2 viral copies this assay can detect is 138 copies/mL. A negative result does not preclude SARS-Cov-2 infection and should not be used as the sole  basis for treatment or other patient management decisions. A negative result may occur with  improper specimen collection/handling, submission of specimen other than nasopharyngeal swab, presence of viral mutation(s) within the areas targeted by this assay, and inadequate number of viral copies(<138 copies/mL). A negative result must be combined with clinical observations, patient history, and epidemiological information. The expected result is Negative.  Fact Sheet for Patients:  BloggerCourse.com  Fact Sheet for Healthcare Providers:  SeriousBroker.it  This test is no t yet approved or cleared by the [place] FDA and  has been authorized for detection and/or diagnosis of SARS-CoV-2 by FDA under an Emergency Use Authorization (EUA). This EUA will remain  in effect (meaning this test can be used) for the duration of the COVID-19 declaration under Section 564(b)(1) of the Act, 21 U.S.C.section 360bbb-3(b)(1), unless the authorization is terminated  or revoked sooner.       Influenza A by PCR NEGATIVE NEGATIVE Final   Influenza B by PCR NEGATIVE NEGATIVE Final    Comment: (NOTE) The Xpert Xpress SARS-CoV-2/FLU/RSV plus assay is intended as an aid in the diagnosis of influenza from Nasopharyngeal swab specimens and should not be used as a sole basis for treatment. Nasal washings and aspirates are unacceptable for Xpert Xpress SARS-CoV-2/FLU/RSV testing.  Fact Sheet for Patients: BloggerCourse.com  Fact Sheet for Healthcare Providers: SeriousBroker.it  This test is not yet approved or cleared by the [place] FDA and has been authorized for detection and/or diagnosis of SARS-CoV-2 by FDA under an Emergency Use Authorization (EUA). This EUA will remain in effect (meaning this test can be used) for the duration of the COVID-19 declaration under Section 564(b)(1) of the Act,  21 U.S.C. section 360bbb-3(b)(1), unless the authorization is terminated or revoked.  Performed at [facility], [address] [place], [place] [address]   MRSA Next Gen by PCR, Nasal     Status: None   Collection Time: 12/29/20 10:46 PM   Specimen: Nasal Mucosa; Nasal Swab  Result Value Ref Range Status   MRSA by PCR Next Gen NOT DETECTED NOT DETECTED Final    Comment: (NOTE) The GeneXpert MRSA Assay (FDA approved for NASAL specimens only), is one component of a comprehensive MRSA colonization surveillance program. It is not intended to diagnose MRSA infection nor to guide or monitor treatment for MRSA infections. Test performance is not FDA approved in patients less than 72 years old. Performed at [facility], [address], [place], [place] [address]   Culture, blood (Routine X 2) w Reflex to ID Panel     Status: None (Preliminary result)   Collection Time: 01/03/21  6:07 PM   Specimen: BLOOD RIGHT FOREARM  Result Value Ref Range Status   Specimen Description   Final    BLOOD RIGHT FOREARM Performed at [facility], [address], [place], [place] [address]    Special Requests   Final    BOTTLES DRAWN AEROBIC ONLY Blood Culture  adequate volume Performed at [facility], [address] [place], [place] [address]    Culture   Final    NO GROWTH 3 DAYS Performed at [facility], [address], [place], [place] [address]    Report Status PENDING  Incomplete  Culture, blood (Routine X 2) w Reflex to ID Panel     Status: None (Preliminary result)   Collection Time: 01/03/21  6:13 PM   Specimen: BLOOD RIGHT HAND  Result Value Ref Range Status   Specimen Description   Final    BLOOD RIGHT HAND Performed at [facility], [address] [place], [place] [address]    Special Requests   Final    BOTTLES DRAWN AEROBIC ONLY Blood Culture adequate volume Performed at [facility], [address] [place], [place] [address]    Culture   Final    NO GROWTH 3 DAYS Performed at [facility], [address], [place], [place] [address]    Report Status PENDING  Incomplete  Gastrointestinal Panel by PCR , Stool     Status: Abnormal   Collection Time: 01/04/21  3:44 PM   Specimen: STOOL  Result Value Ref Range Status   Campylobacter species NOT DETECTED NOT DETECTED Final   Plesimonas shigelloides NOT DETECTED NOT DETECTED Final   Salmonella species NOT DETECTED NOT DETECTED Final   Yersinia enterocolitica NOT DETECTED NOT DETECTED Final   Vibrio species NOT DETECTED NOT DETECTED Final   Vibrio cholerae NOT DETECTED NOT DETECTED Final   Enteroaggregative E coli (EAEC) NOT DETECTED NOT DETECTED Final   Enteropathogenic E coli (EPEC) DETECTED (A) NOT DETECTED Final    Comment: RESULT CALLED TO, READ BACK BY AND VERIFIED WITH: Hazle Quant, RN [phone] 01/05/21 GM    Enterotoxigenic E coli (ETEC) NOT DETECTED NOT DETECTED Final   Shiga like toxin producing E coli (STEC) NOT DETECTED NOT DETECTED Final   Shigella/Enteroinvasive E coli (EIEC) NOT DETECTED NOT DETECTED Final   Cryptosporidium NOT DETECTED NOT DETECTED Final   Cyclospora cayetanensis NOT DETECTED NOT DETECTED Final   Entamoeba histolytica NOT DETECTED NOT DETECTED Final   Giardia lamblia NOT DETECTED NOT DETECTED Final   Adenovirus F40/41 NOT DETECTED NOT DETECTED Final   Astrovirus NOT DETECTED NOT DETECTED Final   Norovirus GI/GII NOT DETECTED NOT DETECTED Final   Rotavirus A NOT DETECTED NOT DETECTED Final   Sapovirus (I, II, IV, and V) NOT DETECTED NOT DETECTED Final    Comment: Performed at [facility], [address] [place], [place] [address]  C Difficile Quick Screen (NO PCR Reflex)     Status: None   Collection Time: 01/04/21  3:44 PM   Specimen: STOOL  Result Value Ref Range Status   C Diff antigen NEGATIVE NEGATIVE Final   C Diff toxin NEGATIVE  NEGATIVE Final   C Diff interpretation No C. difficile detected.  Final    Comment: Performed at [facility], [address] [place], [place] [address]     Labs: BNP (last 3 results) No results for input(s): BNP in the last 8760 hours. Basic Metabolic Panel: Recent Labs  Lab 01/02/21 0645 01/03/21 0951 01/03/21 1346 01/04/21 0011 01/05/21 0303 01/06/21 0302  NA 131*  --  141 132* 133* 139  K 3.2*  --  3.1* 3.9 2.8* 3.2*  CL 99  --  108 102 104 108  CO2 25  --  21* 21* 22 23  GLUCOSE 125*  --  126* 123* 121* 112*  BUN <5*  --  12 13 6  <5*  CREATININE 0.36*  --  1.15 0.87 0.40* 0.44*  CALCIUM 8.2*  --  9.3 8.6* 8.4* 8.6*  MG 1.7 1.9  --   --   --   --   PHOS 3.0  --   --   --   --   --    Liver Function Tests: Recent Labs  Lab 12/31/20 0304 01/04/21 0011 01/05/21 0303 01/06/21 0302  AST 47* 68* 40 42*  ALT 49* 52* 44 46*  ALKPHOS 99 118 106 102  BILITOT 1.3* 0.7 0.6 0.5  PROT 7.7 6.3* 6.1* 6.6  ALBUMIN 3.9 3.0* 3.0* 3.2*   No results for input(s): LIPASE, AMYLASE in the last 168 hours. Recent Labs  Lab 12/31/20 0304 01/04/21 0011  AMMONIA 58* 50*   CBC: Recent Labs  Lab 01/02/21 0645 01/03/21 1629 01/04/21 0011 01/05/21 0303 01/06/21 0302  WBC 4.0 5.8 9.4 8.6 6.2  HGB 10.9* 10.5* 10.2* 10.1* 10.2*  HCT 32.0* 31.6* 30.6* 30.7* 31.2*  MCV 93.8 95.8 96.2 95.6 95.7  PLT 67* 97* 88* 102* 121*   Cardiac Enzymes: No results for input(s): CKTOTAL, CKMB, CKMBINDEX, TROPONINI in the last 168 hours. BNP: Invalid input(s): POCBNP CBG: Recent Labs  Lab 01/03/21 0324 01/03/21 0839 01/03/21 1229 01/03/21 1633 01/03/21 2027  GLUCAP 113* 114* 117* 106* 127*   D-Dimer No results for input(s): DDIMER in the last 72 hours. Hgb A1c No results for input(s): HGBA1C in the last 72 hours. Lipid Profile No results for input(s): CHOL, HDL, LDLCALC, TRIG, CHOLHDL, LDLDIRECT in the last 72 hours. Thyroid function studies No results for  input(s): TSH, T4TOTAL, T3FREE, THYROIDAB in the last 72 hours.  Invalid input(s): FREET3 Anemia work up No results for input(s): VITAMINB12, FOLATE, FERRITIN, TIBC, IRON, RETICCTPCT in the last 72 hours. Urinalysis    Component Value Date/Time   COLORURINE STRAW (A) 11/12/2019 1227   APPEARANCEUR CLEAR 11/12/2019 1227   LABSPEC 1.002 (L) 11/12/2019 1227   PHURINE 6.0 11/12/2019 1227   GLUCOSEU NEGATIVE 11/12/2019 1227   HGBUR NEGATIVE 11/12/2019 1227   BILIRUBINUR NEGATIVE 11/12/2019 1227   KETONESUR NEGATIVE 11/12/2019 1227   PROTEINUR NEGATIVE 11/12/2019 1227   NITRITE NEGATIVE 11/12/2019 1227   LEUKOCYTESUR NEGATIVE 11/12/2019 1227   Sepsis Labs Invalid input(s): PROCALCITONIN,  WBC,  LACTICIDVEN Microbiology Recent Results (from the past 240 hour(s))  Resp Panel by RT-PCR (Flu A&B, Covid) Nasopharyngeal Swab     Status: None   Collection Time: 12/29/20  6:32 AM   Specimen: Nasopharyngeal Swab; Nasopharyngeal(NP) swabs in vial transport medium  Result Value Ref Range Status   SARS Coronavirus 2 by RT PCR NEGATIVE NEGATIVE Final    Comment: (NOTE) SARS-CoV-2 target nucleic acids are NOT DETECTED.  The SARS-CoV-2 RNA is generally detectable in upper respiratory specimens during the acute phase of infection. The lowest concentration of SARS-CoV-2 viral copies this assay can detect is 138 copies/mL. A negative result does not preclude SARS-Cov-2 infection and should not be used as the sole basis for treatment or other patient management decisions. A negative result may occur with  improper specimen collection/handling, submission of specimen other than nasopharyngeal swab, presence of viral mutation(s) within the areas targeted by this assay, and inadequate number of viral copies(<138 copies/mL). A negative result must be combined with clinical observations, patient history, and epidemiological information. The expected result is Negative.  Fact Sheet for Patients:  BloggerCourse.com  Fact Sheet for Healthcare Providers:  SeriousBroker.it  This test is no t yet approved or cleared by the [place] FDA and  has been authorized for detection and/or diagnosis of SARS-CoV-2 by FDA under an Emergency Use Authorization (EUA). This EUA will remain  in effect (meaning this test can be used) for the duration of the COVID-19 declaration under Section 564(b)(1) of the Act, 21 U.S.C.section 360bbb-3(b)(1), unless the authorization is terminated  or revoked sooner.       Influenza A by PCR NEGATIVE NEGATIVE Final   Influenza B by PCR NEGATIVE NEGATIVE Final    Comment: (NOTE) The Xpert Xpress SARS-CoV-2/FLU/RSV plus assay is intended as an aid in the diagnosis of influenza from Nasopharyngeal swab specimens and should not be used as a sole basis for treatment. Nasal washings and aspirates are unacceptable for Xpert Xpress SARS-CoV-2/FLU/RSV testing.  Fact Sheet for Patients: BloggerCourse.com  Fact Sheet for Healthcare Providers: SeriousBroker.it  This test is not yet approved or cleared by the [place] FDA and has been authorized for detection and/or diagnosis of SARS-CoV-2 by FDA under an Emergency Use Authorization (EUA). This EUA will remain in effect (meaning this test can be used) for the duration of the COVID-19 declaration under Section 564(b)(1) of the Act, 21 U.S.C. section 360bbb-3(b)(1), unless the authorization is terminated or revoked.  Performed at [facility], [address] [place], [place] [address]   MRSA Next Gen by PCR, Nasal     Status: None   Collection Time: 12/29/20 10:46 PM   Specimen: Nasal Mucosa; Nasal Swab  Result Value Ref Range Status   MRSA by PCR Next Gen NOT DETECTED NOT DETECTED Final    Comment: (NOTE) The GeneXpert MRSA Assay (FDA approved for NASAL specimens only), is one  component of a comprehensive MRSA colonization surveillance program. It is not intended to diagnose MRSA infection nor to guide or monitor treatment for MRSA infections. Test performance is not FDA approved in patients less than 33 years old. Performed at [facility], [address] [place], [place] [address]   Culture, blood (Routine X 2) w Reflex to ID Panel     Status: None (Preliminary result)   Collection Time: 01/03/21  6:07 PM   Specimen: BLOOD RIGHT FOREARM  Result Value Ref Range Status   Specimen Description   Final    BLOOD RIGHT FOREARM Performed at [facility], [address], [place], [place] [address]    Special Requests   Final    BOTTLES DRAWN AEROBIC ONLY Blood Culture adequate volume Performed at [facility], [address] [place], [place] [address]    Culture   Final    NO GROWTH 3 DAYS Performed at [facility], [address] [place], [place] [address]    Report Status PENDING  Incomplete  Culture, blood (Routine X 2) w Reflex to ID Panel     Status: None (Preliminary result)   Collection Time: 01/03/21  6:13 PM   Specimen: BLOOD RIGHT HAND  Result Value Ref Range Status   Specimen Description   Final    BLOOD RIGHT HAND Performed at [facility], [address], [place], [place] [address]    Special Requests   Final    BOTTLES DRAWN AEROBIC ONLY Blood Culture adequate volume Performed at [facility], [address] [place], [place] [address]    Culture   Final  NO GROWTH 3 DAYS Performed at [facility], [address] [place], [place] [address]    Report Status PENDING  Incomplete  Gastrointestinal Panel by PCR , Stool     Status: Abnormal   Collection Time: 01/04/21  3:44 PM   Specimen: STOOL  Result Value Ref Range Status   Campylobacter species NOT DETECTED NOT DETECTED Final   Plesimonas shigelloides NOT DETECTED NOT DETECTED  Final   Salmonella species NOT DETECTED NOT DETECTED Final   Yersinia enterocolitica NOT DETECTED NOT DETECTED Final   Vibrio species NOT DETECTED NOT DETECTED Final   Vibrio cholerae NOT DETECTED NOT DETECTED Final   Enteroaggregative E coli (EAEC) NOT DETECTED NOT DETECTED Final   Enteropathogenic E coli (EPEC) DETECTED (A) NOT DETECTED Final    Comment: RESULT CALLED TO, READ BACK BY AND VERIFIED WITH: Hazle Quant, RN [phone] 01/05/21 GM    Enterotoxigenic E coli (ETEC) NOT DETECTED NOT DETECTED Final   Shiga like toxin producing E coli (STEC) NOT DETECTED NOT DETECTED Final   Shigella/Enteroinvasive E coli (EIEC) NOT DETECTED NOT DETECTED Final   Cryptosporidium NOT DETECTED NOT DETECTED Final   Cyclospora cayetanensis NOT DETECTED NOT DETECTED Final   Entamoeba histolytica NOT DETECTED NOT DETECTED Final   Giardia lamblia NOT DETECTED NOT DETECTED Final   Adenovirus F40/41 NOT DETECTED NOT DETECTED Final   Astrovirus NOT DETECTED NOT DETECTED Final   Norovirus GI/GII NOT DETECTED NOT DETECTED Final   Rotavirus A NOT DETECTED NOT DETECTED Final   Sapovirus (I, II, IV, and V) NOT DETECTED NOT DETECTED Final    Comment: Performed at [facility], [address] [place], [place] [address]  C Difficile Quick Screen (NO PCR Reflex)     Status: None   Collection Time: 01/04/21  3:44 PM   Specimen: STOOL  Result Value Ref Range Status   C Diff antigen NEGATIVE NEGATIVE Final   C Diff toxin NEGATIVE NEGATIVE Final   C Diff interpretation No C. difficile detected.  Final    Comment: Performed at [facility], [address] [place], [place] [address]   Time coordinating discharge: Over 30 minutes  SIGNED:  Azucena Fallen, DO [facility] 01/06/2021, 12:18 PM Pager   If 7PM-7AM, please contact night-coverage [email or web address]

## 2021-01-06 NOTE — Progress Notes (Signed)
Multiple attempts to contact patients wife to inform her of his discharge toady. Will continue to try and reach her.

## 2021-01-07 LAB — TYPE AND SCREEN
ABO/RH(D): A POS
Antibody Screen: NEGATIVE
Unit division: 0
Unit division: 0
Unit division: 0
Unit division: 0

## 2021-01-07 LAB — BPAM RBC
Blood Product Expiration Date: 202210192359
Blood Product Expiration Date: 202210192359
Blood Product Expiration Date: 202210192359
Blood Product Expiration Date: 202210192359
ISSUE DATE / TIME: 202210061318
ISSUE DATE / TIME: 202210061318
Unit Type and Rh: 6200
Unit Type and Rh: 6200
Unit Type and Rh: 6200
Unit Type and Rh: 6200

## 2021-01-08 LAB — CULTURE, BLOOD (ROUTINE X 2)
Culture: NO GROWTH
Culture: NO GROWTH
Special Requests: ADEQUATE
Special Requests: ADEQUATE

## 2021-01-23 ENCOUNTER — Other Ambulatory Visit: Payer: Self-pay

## 2021-01-23 ENCOUNTER — Emergency Department (HOSPITAL_COMMUNITY): Payer: Self-pay

## 2021-01-23 ENCOUNTER — Emergency Department (HOSPITAL_COMMUNITY)
Admission: EM | Admit: 2021-01-23 | Discharge: 2021-01-23 | Disposition: A | Discharge status: Home / Self Care | Payer: Self-pay | Attending: Emergency Medicine | Admitting: Emergency Medicine

## 2021-01-23 ENCOUNTER — Encounter (HOSPITAL_COMMUNITY): Payer: Self-pay

## 2021-01-23 DIAGNOSIS — R0789 Other chest pain: Secondary | ICD-10-CM | POA: Insufficient documentation

## 2021-01-23 DIAGNOSIS — M25511 Pain in right shoulder: Secondary | ICD-10-CM | POA: Insufficient documentation

## 2021-01-23 DIAGNOSIS — F17200 Nicotine dependence, unspecified, uncomplicated: Secondary | ICD-10-CM | POA: Insufficient documentation

## 2021-01-23 DIAGNOSIS — I10 Essential (primary) hypertension: Secondary | ICD-10-CM | POA: Insufficient documentation

## 2021-01-23 DIAGNOSIS — Z79899 Other long term (current) drug therapy: Secondary | ICD-10-CM | POA: Insufficient documentation

## 2021-01-23 LAB — BASIC METABOLIC PANEL
Anion gap: 7 (ref 5–15)
BUN: 7 mg/dL (ref 6–20)
CO2: 21 mmol/L — ABNORMAL LOW (ref 22–32)
Calcium: 8.7 mg/dL — ABNORMAL LOW (ref 8.9–10.3)
Chloride: 103 mmol/L (ref 98–111)
Creatinine, Ser: 0.39 mg/dL — ABNORMAL LOW (ref 0.61–1.24)
GFR, Estimated: >60 mL/min (ref >60)
Glucose, Bld: 150 mg/dL — ABNORMAL HIGH (ref 70–99)
Potassium: 3.4 mmol/L — ABNORMAL LOW (ref 3.5–5.1)
Sodium: 131 mmol/L — ABNORMAL LOW (ref 135–145)

## 2021-01-23 LAB — ETHANOL: Alcohol, Ethyl (B): <10 mg/dL (ref <10)

## 2021-01-23 LAB — HEPATIC FUNCTION PANEL
ALT: 25 U/L (ref 0–44)
AST: 28 U/L (ref 15–41)
Albumin: 3.1 g/dL — ABNORMAL LOW (ref 3.5–5.0)
Alkaline Phosphatase: 165 U/L — ABNORMAL HIGH (ref 38–126)
Bilirubin, Direct: 0.2 mg/dL (ref 0.0–0.2)
Indirect Bilirubin: 0.6 mg/dL (ref 0.3–0.9)
Total Bilirubin: 0.8 mg/dL (ref 0.3–1.2)
Total Protein: 7.8 g/dL (ref 6.5–8.1)

## 2021-01-23 LAB — CBC
HCT: 31.6 % — ABNORMAL LOW (ref 39.0–52.0)
Hemoglobin: 10.8 g/dL — ABNORMAL LOW (ref 13.0–17.0)
MCH: 30.1 pg (ref 26.0–34.0)
MCHC: 34.2 g/dL (ref 30.0–36.0)
MCV: 88 fL (ref 80.0–100.0)
Platelets: 142 10*3/uL — ABNORMAL LOW (ref 150–400)
RBC: 3.59 MIL/uL — ABNORMAL LOW (ref 4.22–5.81)
RDW: 14.3 % (ref 11.5–15.5)
WBC: 10.6 10*3/uL — ABNORMAL HIGH (ref 4.0–10.5)
nRBC: 0 % (ref 0.0–0.2)

## 2021-01-23 LAB — LIPASE, BLOOD: Lipase: 47 U/L (ref 11–51)

## 2021-01-23 LAB — TROPONIN I (HIGH SENSITIVITY)
Troponin I (High Sensitivity): 15 ng/L (ref <18)
Troponin I (High Sensitivity): 16 ng/L (ref <18)

## 2021-01-23 MED ORDER — LORAZEPAM 2 MG/ML IJ SOLN
1.0000 mg | Freq: Once | INTRAMUSCULAR | Status: AC
  Administered 2021-01-23: 1 mg via INTRAVENOUS
  Filled 2021-01-23: qty 1

## 2021-01-23 NOTE — ED Triage Notes (Signed)
Pt presents with c/o chest pain. Pt reports the pain is on the right side of his chest and radiates to his right arm.

## 2021-01-23 NOTE — ED Provider Notes (Signed)
[facility]-EMERGENCY DEPT Provider Note   CSN: [id number] Arrival date & time: 01/23/21  0865     History Chief Complaint  Patient presents with   Chest Pain    [name] is a 40 y.o. male.  The history is provided by the patient.  Chest Pain Pain location:  R chest Pain quality: aching   Pain severity:  Moderate Onset quality:  Gradual Duration:  1 week Timing:  Constant Progression:  Unchanged Chronicity:  New Context: movement and raising an arm   Relieved by:  Nothing Worsened by:  Nothing Associated symptoms: no abdominal pain, no back pain, no cough, no fever, no palpitations, no shortness of breath and no vomiting   Risk factors: no coronary artery disease, no high cholesterol and no hypertension       Past Medical History:  Diagnosis Date   Alcohol abuse    Cirrhosis (HCC)    DTs (delirium tremens) (HCC)    Hypertension     Patient Active Problem List   Diagnosis Date Noted   Encephalopathy acute    Alcohol withdrawal (HCC) 12/29/2020   LFT elevation 09/27/2020   Hyperbilirubinemia 09/27/2020   Accelerated hypertension 09/27/2020   Thrombocytopenia (HCC) 09/27/2020   firing gun at home, suspected Homicidal behavior, IVCed.  09/27/2020   Hypokalemia    Alcohol withdrawal delirium (HCC) 11/13/2019   DTs (delirium tremens) (HCC) 11/13/2019    History reviewed. No pertinent surgical history.     History reviewed. No pertinent family history.  Social History   Tobacco Use   Smoking status: Some Days   Smokeless tobacco: Never  Substance Use Topics   Alcohol use: Yes    Comment: drinks 6 beers daily   Drug use: Yes    Types: Cocaine    Comment: states that he used cocaine on one occasion last night    Home Medications Prior to Admission medications   Medication Sig Start Date End Date Taking? Authorizing Provider  amLODipine (NORVASC) 10 MG tablet Take 1 tablet (10 mg total) by mouth daily. 01/06/21  05/06/21  Azucena Fallen, MD  folic acid (FOLVITE) 1 MG tablet Take 1 tablet (1 mg total) by mouth daily. 01/06/21   Azucena Fallen, MD  hydrOXYzine (ATARAX/VISTARIL) 10 MG tablet Take 1 tablet (10 mg total) by mouth 3 (three) times daily as needed for anxiety. 01/06/21   Azucena Fallen, MD  indomethacin (INDOCIN) 50 MG capsule Take 50 mg by mouth 2 (two) times daily as needed for mild pain.    [provider]  Multiple Vitamin (MULTIVITAMIN WITH MINERALS) TABS tablet Take 1 tablet by mouth daily. 01/06/21   Azucena Fallen, MD  thiamine 100 MG tablet Take 1 tablet (100 mg total) by mouth daily. 01/06/21   Azucena Fallen, MD    Allergies    Penicillins  Review of Systems   Review of Systems  Constitutional:  Negative for chills and fever.  HENT:  Negative for ear pain and sore throat.   Eyes:  Negative for pain and visual disturbance.  Respiratory:  Negative for cough and shortness of breath.   Cardiovascular:  Positive for chest pain. Negative for palpitations.  Gastrointestinal:  Negative for abdominal pain and vomiting.  Genitourinary:  Negative for dysuria and hematuria.  Musculoskeletal:  Negative for arthralgias and back pain.  Skin:  Negative for color change and rash.  Neurological:  Negative for seizures and syncope.  All other systems reviewed and are negative.  Physical Exam Updated Vital Signs BP 131/82   Pulse 85   Temp 98.7 F (37.1 C) (Oral)   Resp 17   Ht 5\' 6"  (1.676 m)   Wt 72.7 kg   SpO2 97%   BMI 25.87 kg/m   Physical Exam Vitals and nursing note reviewed.  Constitutional:      General: He is not in acute distress.    Appearance: He is well-developed. He is not ill-appearing.  HENT:     Head: Normocephalic and atraumatic.  Eyes:     Conjunctiva/sclera: Conjunctivae normal.     Pupils: Pupils are equal, round, and reactive to light.  Cardiovascular:     Rate and Rhythm: Normal rate and regular rhythm.     Pulses:           Radial pulses are 2+ on the right side and 2+ on the left side.     Heart sounds: Normal heart sounds. No murmur heard. Pulmonary:     Effort: Pulmonary effort is normal. No respiratory distress.     Breath sounds: Normal breath sounds. No decreased breath sounds.  Chest:     Chest wall: Tenderness present.     Comments: Tenderness over right-sided chest wall and right shoulder area Abdominal:     Palpations: Abdomen is soft.     Tenderness: There is no abdominal tenderness.  Musculoskeletal:        General: Normal range of motion.     Cervical back: Normal range of motion and neck supple.     Right lower leg: No edema.     Left lower leg: No edema.  Skin:    General: Skin is warm and dry.  Neurological:     Mental Status: He is alert.    ED Results / Procedures / Treatments   Labs (all labs ordered are listed, but only abnormal results are displayed) Labs Reviewed  BASIC METABOLIC PANEL - Abnormal; Notable for the following components:      Result Value   Sodium 131 (*)    Potassium 3.4 (*)    CO2 21 (*)    Glucose, Bld 150 (*)    Creatinine, Ser 0.39 (*)    Calcium 8.7 (*)    All other components within normal limits  CBC - Abnormal; Notable for the following components:   WBC 10.6 (*)    RBC 3.59 (*)    Hemoglobin 10.8 (*)    HCT 31.6 (*)    Platelets 142 (*)    All other components within normal limits  HEPATIC FUNCTION PANEL - Abnormal; Notable for the following components:   Albumin 3.1 (*)    Alkaline Phosphatase 165 (*)    All other components within normal limits  ETHANOL  LIPASE, BLOOD  TROPONIN I (HIGH SENSITIVITY)  TROPONIN I (HIGH SENSITIVITY)    EKG EKG Interpretation  Date/Time:  Monday January 23 2021 08:53:36 EDT Ventricular Rate:  85 PR Interval:  138 QRS Duration: 99 QT Interval:  382 QTC Calculation: 455 R Axis:   65 Text Interpretation: Sinus rhythm Confirmed by [id number] ([id number]) on 01/23/2021 9:04:55 AM  Radiology DG  Chest 2 View  Result Date: 01/23/2021 CLINICAL DATA:  40 year old male with new onset right chest pain this morning. EXAM: CHEST - 2 VIEW COMPARISON:  Portable chest 01/03/2021 and earlier. FINDINGS: PA and lateral views. Improved, normal lung volumes. Normal cardiac size and mediastinal contours. Visualized tracheal air column is within normal limits. Both lungs appear clear. No pneumothorax  or pleural effusion. Negative visible bowel gas and osseous structures. IMPRESSION: Negative.  No cardiopulmonary abnormality. Electronically Signed   By: Odessa Fleming M.D.   On: 01/23/2021 08:56    Procedures Procedures   Medications Ordered in ED Medications  LORazepam (ATIVAN) injection 1 mg (1 mg Intravenous Given 01/23/21 0908)    ED Course  I have reviewed the triage vital signs and the nursing notes.  Pertinent labs & imaging results that were available during my care of the patient were reviewed by me and considered in my medical decision making (see chart for details).    MDM Rules/Calculators/A&P                           [name] is here with right-sided chest wall pain.  Normal vitals.  No fever.  History of alcohol/cirrhosis.  Denies any abdominal pain, nausea or vomiting.  Pain has been ongoing for the last week.  Denies any recent alcohol use.  Recently admitted for DTs.  Overall he appears well he is tender over the right side of his chest wall.  EKG shows sinus rhythm.  No ischemic changes.  Does not have any PE risk factors and doubt PE.  Will get troponin and check gallbladder and liver enzymes and lipase.  Will give a dose of Ativan.  Troponin negative x2.  No significant anemia, electrolyte ab Mody, kidney injury.  No evidence of elevation of lipase or liver or gallbladder enzymes.  Doubt pancreatitis or cholecystitis.  Overall suspect muscular process.  Discharged in good condition.  This chart was dictated using voice recognition software.  Despite best efforts to  proofread,  errors can occur which can change the documentation meaning.   Final Clinical Impression(s) / ED Diagnoses Final diagnoses:  Chest wall pain    Rx / DC Orders ED Discharge Orders     None        Virgina Norfolk, DO 01/23/21 1216

## 2021-01-23 NOTE — Discharge Instructions (Signed)
Overall suspect that you have muscular pain.  No evidence of heart attack today.  Recommend ice, ibuprofen as needed.  Suspect that this will get better with time.

## 2021-01-30 ENCOUNTER — Other Ambulatory Visit: Payer: Self-pay

## 2021-01-30 ENCOUNTER — Inpatient Hospital Stay (HOSPITAL_COMMUNITY)
Admission: EM | Admit: 2021-01-30 | Discharge: 2021-03-02 | DRG: 064 | Disposition: E | Payer: Self-pay | Attending: Internal Medicine | Admitting: Internal Medicine

## 2021-01-30 ENCOUNTER — Emergency Department (HOSPITAL_COMMUNITY): Payer: Self-pay

## 2021-01-30 DIAGNOSIS — D638 Anemia in other chronic diseases classified elsewhere: Secondary | ICD-10-CM | POA: Diagnosis present

## 2021-01-30 DIAGNOSIS — E8729 Other acidosis: Secondary | ICD-10-CM | POA: Diagnosis present

## 2021-01-30 DIAGNOSIS — I629 Nontraumatic intracranial hemorrhage, unspecified: Secondary | ICD-10-CM

## 2021-01-30 DIAGNOSIS — D689 Coagulation defect, unspecified: Secondary | ICD-10-CM

## 2021-01-30 DIAGNOSIS — G9382 Brain death: Secondary | ICD-10-CM | POA: Diagnosis present

## 2021-01-30 DIAGNOSIS — Z880 Allergy status to penicillin: Secondary | ICD-10-CM

## 2021-01-30 DIAGNOSIS — K7030 Alcoholic cirrhosis of liver without ascites: Secondary | ICD-10-CM | POA: Diagnosis present

## 2021-01-30 DIAGNOSIS — Z20822 Contact with and (suspected) exposure to covid-19: Secondary | ICD-10-CM | POA: Diagnosis present

## 2021-01-30 DIAGNOSIS — I615 Nontraumatic intracerebral hemorrhage, intraventricular: Principal | ICD-10-CM | POA: Diagnosis present

## 2021-01-30 DIAGNOSIS — G9340 Encephalopathy, unspecified: Secondary | ICD-10-CM

## 2021-01-30 DIAGNOSIS — D696 Thrombocytopenia, unspecified: Secondary | ICD-10-CM | POA: Diagnosis present

## 2021-01-30 DIAGNOSIS — E876 Hypokalemia: Secondary | ICD-10-CM | POA: Diagnosis present

## 2021-01-30 DIAGNOSIS — G935 Compression of brain: Secondary | ICD-10-CM | POA: Diagnosis present

## 2021-01-30 DIAGNOSIS — R509 Fever, unspecified: Secondary | ICD-10-CM | POA: Diagnosis present

## 2021-01-30 DIAGNOSIS — A419 Sepsis, unspecified organism: Secondary | ICD-10-CM

## 2021-01-30 DIAGNOSIS — I959 Hypotension, unspecified: Secondary | ICD-10-CM | POA: Diagnosis present

## 2021-01-30 DIAGNOSIS — J9601 Acute respiratory failure with hypoxia: Secondary | ICD-10-CM | POA: Diagnosis present

## 2021-01-30 DIAGNOSIS — Z66 Do not resuscitate: Secondary | ICD-10-CM | POA: Diagnosis present

## 2021-01-30 DIAGNOSIS — F17200 Nicotine dependence, unspecified, uncomplicated: Secondary | ICD-10-CM | POA: Diagnosis present

## 2021-01-30 DIAGNOSIS — I619 Nontraumatic intracerebral hemorrhage, unspecified: Secondary | ICD-10-CM | POA: Diagnosis present

## 2021-01-30 DIAGNOSIS — F1010 Alcohol abuse, uncomplicated: Secondary | ICD-10-CM | POA: Diagnosis present

## 2021-01-30 DIAGNOSIS — I611 Nontraumatic intracerebral hemorrhage in hemisphere, cortical: Secondary | ICD-10-CM

## 2021-01-30 DIAGNOSIS — I10 Essential (primary) hypertension: Secondary | ICD-10-CM | POA: Diagnosis present

## 2021-01-30 LAB — PROTIME-INR
INR: 1.3 — ABNORMAL HIGH (ref 0.8–1.2)
Prothrombin Time: 16.5 seconds — ABNORMAL HIGH (ref 11.4–15.2)

## 2021-01-30 LAB — I-STAT ARTERIAL BLOOD GAS, ED
Acid-base deficit: 4 mmol/L — ABNORMAL HIGH (ref 0.0–2.0)
Bicarbonate: 22.5 mmol/L (ref 20.0–28.0)
Calcium, Ion: 1.23 mmol/L (ref 1.15–1.40)
HCT: 31 % — ABNORMAL LOW (ref 39.0–52.0)
Hemoglobin: 10.5 g/dL — ABNORMAL LOW (ref 13.0–17.0)
O2 Saturation: 100 %
Patient temperature: 102.1
Potassium: 3 mmol/L — ABNORMAL LOW (ref 3.5–5.1)
Sodium: 135 mmol/L (ref 135–145)
TCO2: 24 mmol/L (ref 22–32)
pCO2 arterial: 49.2 mmHg — ABNORMAL HIGH (ref 32.0–48.0)
pH, Arterial: 7.277 — ABNORMAL LOW (ref 7.350–7.450)
pO2, Arterial: 376 mmHg — ABNORMAL HIGH (ref 83.0–108.0)

## 2021-01-30 LAB — TYPE AND SCREEN
ABO/RH(D): A POS
Antibody Screen: NEGATIVE

## 2021-01-30 LAB — COMPREHENSIVE METABOLIC PANEL
ALT: 28 U/L (ref 0–44)
AST: 46 U/L — ABNORMAL HIGH (ref 15–41)
Albumin: 2.9 g/dL — ABNORMAL LOW (ref 3.5–5.0)
Alkaline Phosphatase: 173 U/L — ABNORMAL HIGH (ref 38–126)
Anion gap: 13 (ref 5–15)
BUN: 8 mg/dL (ref 6–20)
CO2: 19 mmol/L — ABNORMAL LOW (ref 22–32)
Calcium: 8.8 mg/dL — ABNORMAL LOW (ref 8.9–10.3)
Chloride: 100 mmol/L (ref 98–111)
Creatinine, Ser: 0.62 mg/dL (ref 0.61–1.24)
GFR, Estimated: >60 mL/min (ref >60)
Glucose, Bld: 213 mg/dL — ABNORMAL HIGH (ref 70–99)
Potassium: 3 mmol/L — ABNORMAL LOW (ref 3.5–5.1)
Sodium: 132 mmol/L — ABNORMAL LOW (ref 135–145)
Total Bilirubin: 1.4 mg/dL — ABNORMAL HIGH (ref 0.3–1.2)
Total Protein: 8 g/dL (ref 6.5–8.1)

## 2021-01-30 LAB — CBC WITH DIFFERENTIAL/PLATELET
Abs Immature Granulocytes: 0.47 10*3/uL — ABNORMAL HIGH (ref 0.00–0.07)
Basophils Absolute: 0.1 10*3/uL (ref 0.0–0.1)
Basophils Relative: 0 %
Eosinophils Absolute: 0.1 10*3/uL (ref 0.0–0.5)
Eosinophils Relative: 0 %
HCT: 34.5 % — ABNORMAL LOW (ref 39.0–52.0)
Hemoglobin: 11.3 g/dL — ABNORMAL LOW (ref 13.0–17.0)
Immature Granulocytes: 2 %
Lymphocytes Relative: 6 %
Lymphs Abs: 1.7 10*3/uL (ref 0.7–4.0)
MCH: 28.6 pg (ref 26.0–34.0)
MCHC: 32.8 g/dL (ref 30.0–36.0)
MCV: 87.3 fL (ref 80.0–100.0)
Monocytes Absolute: 2.1 10*3/uL — ABNORMAL HIGH (ref 0.1–1.0)
Monocytes Relative: 8 %
Neutro Abs: 22.8 10*3/uL — ABNORMAL HIGH (ref 1.7–7.7)
Neutrophils Relative %: 84 %
Platelets: 188 10*3/uL (ref 150–400)
RBC: 3.95 MIL/uL — ABNORMAL LOW (ref 4.22–5.81)
RDW: 15.1 % (ref 11.5–15.5)
WBC: 27.2 10*3/uL — ABNORMAL HIGH (ref 4.0–10.5)
nRBC: 0 % (ref 0.0–0.2)

## 2021-01-30 LAB — BLOOD CULTURE ID PANEL (REFLEXED) - BCID2

## 2021-01-30 LAB — LIPASE, BLOOD: Lipase: 45 U/L (ref 11–51)

## 2021-01-30 LAB — POC OCCULT BLOOD, ED
Fecal Occult Bld: POSITIVE — AB
Fecal Occult Bld: POSITIVE — AB

## 2021-01-30 LAB — I-STAT CHEM 8, ED
BUN: 7 mg/dL (ref 6–20)
Calcium, Ion: 1.14 mmol/L — ABNORMAL LOW (ref 1.15–1.40)
Chloride: 101 mmol/L (ref 98–111)
Creatinine, Ser: 0.4 mg/dL — ABNORMAL LOW (ref 0.61–1.24)
Glucose, Bld: 212 mg/dL — ABNORMAL HIGH (ref 70–99)
HCT: 36 % — ABNORMAL LOW (ref 39.0–52.0)
Hemoglobin: 12.2 g/dL — ABNORMAL LOW (ref 13.0–17.0)
Potassium: 3 mmol/L — ABNORMAL LOW (ref 3.5–5.1)
Sodium: 135 mmol/L (ref 135–145)
TCO2: 21 mmol/L — ABNORMAL LOW (ref 22–32)

## 2021-01-30 LAB — APTT: aPTT: 32 seconds (ref 24–36)

## 2021-01-30 LAB — RESP PANEL BY RT-PCR (FLU A&B, COVID) ARPGX2
Influenza A by PCR: NEGATIVE
Influenza B by PCR: NEGATIVE
SARS Coronavirus 2 by RT PCR: NEGATIVE

## 2021-01-30 LAB — AMMONIA: Ammonia: 91 umol/L — ABNORMAL HIGH (ref 9–35)

## 2021-01-30 LAB — ETHANOL: Alcohol, Ethyl (B): <10 mg/dL (ref <10)

## 2021-01-30 LAB — LACTIC ACID, PLASMA
Lactic Acid, Venous: 3.5 mmol/L (ref 0.5–1.9)
Lactic Acid, Venous: 5.4 mmol/L (ref 0.5–1.9)

## 2021-01-30 LAB — TROPONIN I (HIGH SENSITIVITY)
Troponin I (High Sensitivity): 26 ng/L — ABNORMAL HIGH (ref <18)
Troponin I (High Sensitivity): 28 ng/L — ABNORMAL HIGH (ref <18)

## 2021-01-30 LAB — MRSA NEXT GEN BY PCR, NASAL: MRSA by PCR Next Gen: NOT DETECTED

## 2021-01-30 MED ORDER — SODIUM CHLORIDE 0.9 % IV SOLN
10.0000 mL/h | Freq: Once | INTRAVENOUS | Status: AC
  Administered 2021-01-30: 10 mL/h via INTRAVENOUS

## 2021-01-30 MED ORDER — VANCOMYCIN HCL 1500 MG/300ML IV SOLN
1500.0000 mg | Freq: Once | INTRAVENOUS | Status: DC
  Administered 2021-01-30: 1500 mg via INTRAVENOUS
  Filled 2021-01-30: qty 300

## 2021-01-30 MED ORDER — VANCOMYCIN HCL 1500 MG/300ML IV SOLN
1500.0000 mg | INTRAVENOUS | Status: DC
  Filled 2021-01-30: qty 300

## 2021-01-30 MED ORDER — ACETAMINOPHEN 650 MG RE SUPP
650.0000 mg | Freq: Once | RECTAL | Status: AC
  Administered 2021-01-30: 650 mg via RECTAL
  Filled 2021-01-30: qty 1

## 2021-01-30 MED ORDER — ACETAMINOPHEN 325 MG PO TABS: 650.0000 mg | ORAL_TABLET | ORAL | Status: DC | PRN

## 2021-01-30 MED ORDER — SODIUM CHLORIDE 0.9 % IV SOLN
2.0000 g | Freq: Once | INTRAVENOUS | Status: AC
  Administered 2021-01-30: 2 g via INTRAVENOUS
  Filled 2021-01-30: qty 2

## 2021-01-30 MED ORDER — ROCURONIUM BROMIDE 50 MG/5ML IV SOLN
INTRAVENOUS | Status: DC | PRN
  Administered 2021-01-30: 50 mg via INTRAVENOUS

## 2021-01-30 MED ORDER — ACETAMINOPHEN 650 MG RE SUPP: 650.0000 mg | RECTAL | Status: DC | PRN

## 2021-01-30 MED ORDER — CHLORHEXIDINE GLUCONATE CLOTH 2 % EX PADS
6.0000 | MEDICATED_PAD | Freq: Every day | CUTANEOUS | Status: DC
  Administered 2021-01-30 – 2021-01-31 (×2): 6 via TOPICAL

## 2021-01-30 MED ORDER — METRONIDAZOLE 500 MG/100ML IV SOLN
500.0000 mg | Freq: Once | INTRAVENOUS | Status: AC
  Administered 2021-01-30: 500 mg via INTRAVENOUS
  Filled 2021-01-30: qty 100

## 2021-01-30 MED ORDER — ETOMIDATE 2 MG/ML IV SOLN
INTRAVENOUS | Status: DC | PRN
  Administered 2021-01-30: 20 mg via INTRAVENOUS

## 2021-01-30 MED ORDER — LACTATED RINGERS IV BOLUS (SEPSIS)
1000.0000 mL | Freq: Once | INTRAVENOUS | Status: AC
  Administered 2021-01-30: 1000 mL via INTRAVENOUS

## 2021-01-30 MED ORDER — PHENYLEPHRINE HCL-NACL 20-0.9 MG/250ML-% IV SOLN
0.0000 ug/min | INTRAVENOUS | Status: DC
  Administered 2021-01-30: 20 ug/min via INTRAVENOUS
  Administered 2021-01-31: 60 ug/min via INTRAVENOUS
  Administered 2021-01-31: 140 ug/min via INTRAVENOUS
  Filled 2021-01-30 (×2): qty 250

## 2021-01-30 MED ORDER — ORAL CARE MOUTH RINSE
15.0000 mL | OROMUCOSAL | Status: DC
  Administered 2021-01-31 (×4): 15 mL via OROMUCOSAL

## 2021-01-30 MED ORDER — CLEVIDIPINE BUTYRATE 0.5 MG/ML IV EMUL
0.0000 mg/h | INTRAVENOUS | Status: DC
  Administered 2021-01-30: 2 mg/h via INTRAVENOUS
  Filled 2021-01-30: qty 50

## 2021-01-30 MED ORDER — PROPOFOL 1000 MG/100ML IV EMUL
5.0000 ug/kg/min | INTRAVENOUS | Status: DC
  Administered 2021-01-30: 10 ug/kg/min via INTRAVENOUS

## 2021-01-30 MED ORDER — LACTATED RINGERS IV SOLN: INTRAVENOUS | Status: DC

## 2021-01-30 MED ORDER — LEVETIRACETAM IN NACL 1000 MG/100ML IV SOLN
1000.0000 mg | Freq: Once | INTRAVENOUS | Status: AC
  Administered 2021-01-30: 1000 mg via INTRAVENOUS
  Filled 2021-01-30: qty 100

## 2021-01-30 MED ORDER — PANTOPRAZOLE SODIUM 40 MG IV SOLR
40.0000 mg | Freq: Every day | INTRAVENOUS | Status: DC
  Administered 2021-01-30: 40 mg via INTRAVENOUS
  Filled 2021-01-30: qty 40

## 2021-01-30 MED ORDER — ACETAMINOPHEN 160 MG/5ML PO SOLN: 650.0000 mg | ORAL | Status: DC | PRN

## 2021-01-30 MED ORDER — CHLORHEXIDINE GLUCONATE 0.12% ORAL RINSE (MEDLINE KIT)
15.0000 mL | Freq: Two times a day (BID) | OROMUCOSAL | Status: DC
  Administered 2021-01-31 (×2): 15 mL via OROMUCOSAL

## 2021-01-30 MED ORDER — VANCOMYCIN HCL 1250 MG/250ML IV SOLN
1250.0000 mg | Freq: Two times a day (BID) | INTRAVENOUS | Status: DC
  Administered 2021-01-31: 1250 mg via INTRAVENOUS
  Filled 2021-01-30 (×2): qty 250

## 2021-01-30 MED ORDER — LACTATED RINGERS IV BOLUS (SEPSIS)
250.0000 mL | Freq: Once | INTRAVENOUS | Status: AC
  Administered 2021-01-30: 250 mL via INTRAVENOUS

## 2021-01-30 NOTE — H&P (Signed)
NAME:  [name], MRN:  [id number], DOB:  Dec 13, 1980, LOS: 0 ADMISSION DATE:  02/11/21, CONSULTATION DATE:  10/31 REFERRING MD:  Dr. Bernette Mayers, CHIEF COMPLAINT:  AMS, ICH   History of Present Illness:  40 y/o M who presented to [facility] ER via EMS 10/31 with reports of AMS, emesis and agonal respirations.  Intubated per EMS.  Patient recently seen in ER at [facility] on 10/24 for an aching right sided chest pain. He had a normal EKG at that time and right sided chest pain.  Troponin negative x2. Pain was thought to be muscular in nature.    Wife reports he has been complaining of chest pain for several weeks.  No headaches, dizziness.  Wife found him am of presentation on couch, he had vomited and she was unable to wake him.  EMS was called.    In ER found to have large ICH with midline shift.  Reviewed by neurosurgery and not a candidate for interventions.  Interview conducted with assistance of Spanish interpretor on a stick.  Pertinent  Medical History  ETOH Abuse with prior DT's HTN Cirrhosis  Thrombocytopenia   Significant Hospital Events: Including procedures, antibiotic start and stop dates in addition to other pertinent events   10/31 Admit with very large ICH  Interim History / Subjective:    Objective   Blood pressure (!) 97/47, pulse (!) 131, temperature 98.6 F (37 C), temperature source Axillary, resp. rate 18, height 5\' 6"  (1.676 m), weight 72.7 kg, SpO2 97 %.    Vent Mode: PRVC FiO2 (%):  [100 %] 100 % Set Rate:  [18 bmp] 18 bmp Vt Set:  [510 mL] 510 mL PEEP:  [5 cmH20] 5 cmH20 Plateau Pressure:  [20 cmH20] 20 cmH20   Intake/Output Summary (Last 24 hours) at 02-11-21 1035 Last data filed at 2021-02-11 02/01/2021 Gross per 24 hour  Intake 1467.56 ml  Output 175 ml  Net 1292.56 ml   Filed Weights   02/11/2021 0945  Weight: 72.7 kg    Examination:  General - unresponsive Eyes - pupils dilated ENT - ETT in place Cardiac - regular, tachycardic Chest  - b/l rhonchi Abdomen - soft, non tender, + bowel sounds Extremities - 1+ edema Skin - no rashes Neuro - no pupillary response, no gag; does breath over set rate on vent intermittently  CT head - 7.1 x 4.5 x 6.4 cm (110 cc) ICH in Lt frontal lobe with IVH into frontal horn of Lt lateral ventricle, 1.5 cm Lt to Rt shift, 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 - not candidate for neurosurgerical interventions  Compromised airway. - continue ventilator support while family decides about goals of care  Anemia of chronic disease with hx of ETOH with cirrhosis. - defer additional lab testing  Fever. - most likely central from ICH and IVH - defer ABx  Lactic acidosis. - in setting of cirrhosis and hypoxia  Hypokalemia. - defer additional lab testing  Goals of care. - family needs more time to process gravity of this event, but understand that he can not recover from this process - will ask palliative care team to assist with goals of care discussion - explained to family that he might developed progressive cerebral edema and this could lead to cardiac arrest; as such the family agreed to DNR status  Labs  CBC: Recent Labs  Lab 31-Jan-2021 0801 31-Jan-2021 0813 Jan 31, 2021 0912  WBC 27.2*  --   --   NEUTROABS 22.8*  --   --   HGB 11.3* 12.2* 10.5*  HCT 34.5* 36.0* 31.0*  MCV 87.3  --   --   PLT 188  --   --     Basic Metabolic Panel: Recent Labs  Lab 02/22/2021 0813 03/01/2021 0912  NA 135 135  K 3.0* 3.0*  CL 101  --   GLUCOSE 212*  --   BUN 7  --   CREATININE 0.40*  --    GFR: Estimated Creatinine Clearance: 111.9 mL/min (A) (by C-G formula based on SCr of 0.4 mg/dL (L)). Recent Labs  Lab 02/03/2021 0800 02/24/2021 0801  WBC  --  27.2*  LATICACIDVEN 3.5*  --     Liver Function Tests: No results for input(s): AST, ALT, ALKPHOS, BILITOT, PROT,  ALBUMIN in the last 168 hours. No results for input(s): LIPASE, AMYLASE in the last 168 hours. Recent Labs  Lab January 31, 2021 0805  AMMONIA 91*    ABG    Component Value Date/Time   PHART 7.277 (L) Jan 31, 2021 0912   PCO2ART 49.2 (H) 31-Jan-2021 0912   PO2ART 376 (H) 31-Jan-2021 0912   HCO3 22.5 02/21/2021 0912   TCO2 24 03/01/2021 0912   ACIDBASEDEF 4.0 (H) 03/01/2021 0912   O2SAT 100.0 02/06/2021 0912     Coagulation Profile: Recent Labs  Lab 02/07/2021 0801  INR 1.3*    Cardiac Enzymes: No results for input(s): CKTOTAL, CKMB, CKMBINDEX, TROPONINI in the last 168 hours.  HbA1C: Hgb A1c MFr Bld  Date/Time Value Ref Range Status  09/29/2020 03:27 PM 5.7 (H) 4.8 - 5.6 % Final    Comment:    (NOTE)         Prediabetes: 5.7 - 6.4         Diabetes: >6.4         Glycemic control for adults with diabetes: <7.0     CBG: No results for input(s): GLUCAP in the last 168 hours.  Review of Systems:   Unable to obtain  Past Medical History:  He,  has a past medical history of Alcohol abuse, Cirrhosis (HCC), DTs (delirium tremens) (HCC), and Hypertension.   Surgical History:  No past surgical history on file.   Social History:   reports that he has been smoking. He has never used smokeless tobacco. He reports current alcohol use. He reports current drug use. Drug: Cocaine.   Family History:  His family history is not on file.   Allergies Allergies  Allergen Reactions   Penicillins Swelling    Per his wife: eyes swelled shut.     Home Medications  Prior to Admission medications   Medication Sig Start Date End Date Taking? Authorizing Provider  amLODipine (NORVASC) 10 MG tablet Take 1 tablet (10 mg total) by mouth daily. 01/06/21 05/06/21  Azucena Fallen, MD  folic acid (FOLVITE) 1 MG tablet Take 1 tablet (1 mg total) by mouth daily. 01/06/21   Azucena Fallen, MD  hydrOXYzine (ATARAX/VISTARIL) 10 MG tablet Take 1 tablet (10 mg total) by mouth 3 (three) times  daily as needed for anxiety. 01/06/21   Azucena Fallen, MD  indomethacin (INDOCIN) 50 MG capsule Take 50 mg by mouth 2 (two) times daily as needed for mild pain.    [provider]  Multiple Vitamin (MULTIVITAMIN WITH MINERALS) TABS tablet Take 1 tablet by mouth daily. 01/06/21  Azucena Fallen, MD  thiamine 100 MG tablet Take 1 tablet (100 mg total) by mouth daily. 01/06/21   Azucena Fallen, MD     Critical care time: 32 minutes by me independent of APP time  Coralyn Helling, MD [facility] Pulmonary/Critical Care Pager - [phone] 01/10/2021, 11:12 AM

## 2021-01-30 NOTE — ED Provider Notes (Signed)
[facility] Provider Note  CSN: [id number] Arrival date & time: 02/01/2021 0746    History Chief Complaint  Patient presents with   Altered Mental Status     [name] is a 40 y.o. male with history of EtOH abuse brought to the ED via EMS from home where he was found unresponsive this morning, last seen awake was around 2am. He was agonal and hypoxic initially into the low 80s on NRB and arrives with BVM ventilation. He has apparently vomited as well, concerning for aspiration.He has not been responsive to painful stimuli. He was admitted 9/28 for fever, DTs and thrombocytopenia, sepsis presumed to be either urine or PNA source. He was seen 10/24 for chest pain, ED workup neg and ultimately discharged. Level 5 caveat applies.    Past Medical History:  Diagnosis Date   Alcohol abuse    Cirrhosis (HCC)    DTs (delirium tremens) (HCC)    Hypertension     No past surgical history on file.  No family history on file.  Social History   Tobacco Use   Smoking status: Some Days   Smokeless tobacco: Never  Substance Use Topics   Alcohol use: Yes    Comment: drinks 6 beers daily   Drug use: Yes    Types: Cocaine    Comment: states that he used cocaine on one occasion last night     Home Medications Prior to Admission medications   Medication Sig Start Date End Date Taking? Authorizing Provider  amLODipine (NORVASC) 10 MG tablet Take 1 tablet (10 mg total) by mouth daily. 01/06/21 05/06/21  Azucena Fallen, MD  folic acid (FOLVITE) 1 MG tablet Take 1 tablet (1 mg total) by mouth daily. 01/06/21   Azucena Fallen, MD  hydrOXYzine (ATARAX/VISTARIL) 10 MG tablet Take 1 tablet (10 mg total) by mouth 3 (three) times daily as needed for anxiety. 01/06/21   Azucena Fallen, MD  indomethacin (INDOCIN) 50 MG capsule Take 50 mg by mouth 2 (two) times daily as needed for mild pain.    [provider]  Multiple Vitamin (MULTIVITAMIN  WITH MINERALS) TABS tablet Take 1 tablet by mouth daily. 01/06/21   Azucena Fallen, MD  thiamine 100 MG tablet Take 1 tablet (100 mg total) by mouth daily. 01/06/21   Azucena Fallen, MD     Allergies    Penicillins   Review of Systems   Review of Systems Unable to assess due to mental status.    Physical Exam BP (!) 97/47   Pulse (!) 131   Temp 98.6 F (37 C) (Axillary)   Resp 18   Ht 5\' 6"  (1.676 m)   Wt 72.7 kg   SpO2 97%   BMI 25.87 kg/m   Physical Exam Vitals and nursing note reviewed.  Constitutional:      Appearance: Normal appearance.     Comments: unresponsive  HENT:     Head: Normocephalic and atraumatic.     Nose: Nose normal.     Mouth/Throat:     Mouth: Mucous membranes are moist.     Comments: Emesis or thick mucous in airway Eyes:     Extraocular Movements: Extraocular movements intact.     Conjunctiva/sclera: Conjunctivae normal.     Comments: Pupils are unequal L larger than R  Cardiovascular:     Rate and Rhythm: Tachycardia present.  Pulmonary:     Breath sounds: Rhonchi present.     Comments: tachypnea  Abdominal:     General: Abdomen is flat.     Palpations: Abdomen is soft.     Tenderness: There is no abdominal tenderness.  Musculoskeletal:        General: No swelling. Normal range of motion.     Cervical back: Neck supple.  Skin:    General: Skin is warm and dry.  Neurological:     Comments: unresponsive  Psychiatric:     Comments: Unable to assess     ED Results / Procedures / Treatments   Labs (all labs ordered are listed, but only abnormal results are displayed) Labs Reviewed  LACTIC ACID, PLASMA - Abnormal; Notable for the following components:      Result Value   Lactic Acid, Venous 3.5 (*)    All other components within normal limits  CBC WITH DIFFERENTIAL/PLATELET - Abnormal; Notable for the following components:   WBC 27.2 (*)    RBC 3.95 (*)    Hemoglobin 11.3 (*)    HCT 34.5 (*)    Neutro Abs 22.8 (*)     Monocytes Absolute 2.1 (*)    Abs Immature Granulocytes 0.47 (*)    All other components within normal limits  PROTIME-INR - Abnormal; Notable for the following components:   Prothrombin Time 16.5 (*)    INR 1.3 (*)    All other components within normal limits  AMMONIA - Abnormal; Notable for the following components:   Ammonia 91 (*)    All other components within normal limits  I-STAT CHEM 8, ED - Abnormal; Notable for the following components:   Potassium 3.0 (*)    Creatinine, Ser 0.40 (*)    Glucose, Bld 212 (*)    Calcium, Ion 1.14 (*)    TCO2 21 (*)    Hemoglobin 12.2 (*)    HCT 36.0 (*)    All other components within normal limits  I-STAT ARTERIAL BLOOD GAS, ED - Abnormal; Notable for the following components:   pH, Arterial 7.277 (*)    pCO2 arterial 49.2 (*)    pO2, Arterial 376 (*)    Acid-base deficit 4.0 (*)    Potassium 3.0 (*)    HCT 31.0 (*)    Hemoglobin 10.5 (*)    All other components within normal limits  POC OCCULT BLOOD, ED - Abnormal; Notable for the following components:   Fecal Occult Bld POSITIVE (*)    All other components within normal limits  POC OCCULT BLOOD, ED - Abnormal; Notable for the following components:   Fecal Occult Bld POSITIVE (*)    All other components within normal limits  RESP PANEL BY RT-PCR (FLU A&B, COVID) [id number]  CULTURE, BLOOD (ROUTINE X 2)  CULTURE, BLOOD (ROUTINE X 2)  APTT  ETHANOL  COMPREHENSIVE METABOLIC PANEL  LIPASE, BLOOD  GASTRIC OCCULT BLOOD (1-CARD TO LAB)  TYPE AND SCREEN  PREPARE FRESH FROZEN PLASMA  TROPONIN I (HIGH SENSITIVITY)  TROPONIN I (HIGH SENSITIVITY)    EKG EKG Interpretation  Date/Time:  Monday January 30 2021 08:19:48 EDT Ventricular Rate:  110 PR Interval:  123 QRS Duration: 100 QT Interval:  335 QTC Calculation: 454 R Axis:   66 Text Interpretation: Sinus tachycardia with irregular rate Probable left atrial enlargement Left ventricular hypertrophy Nonspecific T abnormalities,  inferior leads Since last tracing Rate faster T wave amplitude is increased Confirmed by Susy Frizzle [phone]) on 01/01/2021 8:23:09 AM  Radiology CT Head Wo Contrast  Result Date: 01/25/2021 CLINICAL DATA:  Mental status change of unknown  cause. Found unresponsive with agonal respirations. EXAM: CT HEAD WITHOUT CONTRAST TECHNIQUE: Contiguous axial images were obtained from the base of the skull through the vertex without intravenous contrast. COMPARISON:  12/29/2020 FINDINGS: Brain: 7.1 x 4.5 x 6.4 cm (volume = 110 cm^3) intraparenchymal hemorrhage within the left frontal lobe. Intraventricular penetration into the frontal horn of the left lateral ventricle with intraventricular blood extending down through the fourth ventricle. Mass effect with left-to-right shift of 1.5 cm. 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 No subdural collection. Vascular: No abnormal vascular finding. Skull: Negative Sinuses/Orbits: Clear/normal Other: None IMPRESSION: 110 cc intraparenchymal hemorrhage within the left frontal lobe. Intraventricular penetration. Mass effect with left-to-right shift of 15 mm. 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 Critical Value/emergent results were called by telephone at the time of interpretation on 01/22/2021 at 8:52 am to provider [facility] , who verbally acknowledged these results. Electronically Signed   By: Paulina Fusi M.D.   On: 01/29/2021 08:54   DG Chest Portable 1 View  Result Date: 01/24/2021 CLINICAL DATA:  intubation EXAM: PORTABLE CHEST 1 VIEW COMPARISON:  January 23, 2021. FINDINGS: Endotracheal tube tip approximately 1.3 cm above the carina. Gastric tube is looped back on itself in the lower chest, coursing superiorly outside the field of view in the neck. Patchy airspace opacities in the right upper lung. No visible pneumothorax or pleural effusion on this supine radiograph. IMPRESSION: 1. Enteric tube is  a approximately 1.3 cm above the carina. Recommend retraction. 2. Gastric tube is looped back on itself in the lower chest, coursing superiorly outside the field of view in the neck. Recommend replacement. 3. Patchy opacities in the right upper lung, suspicious for aspiration and/or pneumonia. Findings and recommendations discussed with Dr. Bernette Mayers via telephone at 8:18 AM. Electronically Signed   By: Feliberto Harts M.D.   On: 01/11/2021 08:20    Procedures Procedure Name: Intubation Date/Time: 01/13/2021 8:07 AM Performed by: Pollyann Savoy, MD Pre-anesthesia Checklist: Patient identified, Patient being monitored, Suction available and Emergency Drugs available Oxygen Delivery Method: Ambu bag Preoxygenation: Pre-oxygenation with 100% oxygen Induction Type: Rapid sequence Ventilation: Mask ventilation without difficulty Laryngoscope Size: Glidescope and 4 Grade View: Grade I Tube size: 8.0 mm Number of attempts: 2 (suctioned and bagged between attempts) Placement Confirmation: ETT inserted through vocal cords under direct vision, CO2 detector and Breath sounds checked- equal and bilateral Secured at: 24 cm Tube secured with: ETT holder    .Critical Care Performed by: Pollyann Savoy, MD Authorized by: Pollyann Savoy, MD   Critical care provider statement:    Critical care time (minutes):  90   Critical care time was exclusive of:  Separately billable procedures and treating other patients   Critical care was necessary to treat or prevent imminent or life-threatening deterioration of the following conditions:  CNS failure or compromise, respiratory failure and sepsis   Critical care was time spent personally by me on the following activities:  Development of treatment plan with patient or surrogate, discussions with consultants, evaluation of patient's response to treatment, examination of patient, obtaining history from patient or surrogate, ordering and performing  treatments and interventions, ordering and review of laboratory studies, ordering and review of radiographic studies, pulse oximetry, re-evaluation of patient's condition and review of old charts  Medications Ordered in the ED Medications  etomidate (AMIDATE) injection (20 mg Intravenous Given 01/29/2021 0748)  lactated ringers infusion ( Intravenous New Bag/Given 01/09/2021 1012)  propofol (DIPRIVAN) 1000  MG/100ML infusion (10 mcg/kg/min  72.7 kg Intravenous New Bag/Given 01/10/2021 0847)  clevidipine (CLEVIPREX) infusion 0.5 mg/mL (0 mg/hr Intravenous Paused 01/22/2021 1011)  acetaminophen (TYLENOL) tablet 650 mg (has no administration in time range)    Or  acetaminophen (TYLENOL) 160 MG/5ML solution 650 mg (has no administration in time range)    Or  acetaminophen (TYLENOL) suppository 650 mg (has no administration in time range)  pantoprazole (PROTONIX) injection 40 mg (has no administration in time range)  lactated ringers bolus 1,000 mL (0 mLs Intravenous Stopped 01/22/2021 0850)    And  lactated ringers bolus 1,000 mL (0 mLs Intravenous Stopped 01/16/2021 0904)    And  lactated ringers bolus 250 mL (0 mLs Intravenous Stopped 01/16/2021 0953)  ceFEPIme (MAXIPIME) 2 g in sodium chloride 0.9 % 100 mL IVPB (0 g Intravenous Stopped 01/10/2021 0927)  metroNIDAZOLE (FLAGYL) IVPB 500 mg (0 mg Intravenous Stopped 01/13/2021 0959)  acetaminophen (TYLENOL) suppository 650 mg (650 mg Rectal Given 01/03/2021 0935)  0.9 %  sodium chloride infusion (10 mL/hr Intravenous New Bag/Given 01/22/2021 1017)  levETIRAcetam (KEPPRA) IVPB 1000 mg/100 mL premix (0 mg Intravenous Stopped 01/04/2021 0959)     MDM Rules/Calculators/A&P MDM  Patient arrives unresponsive, febrile and tachycardic/tachypneic. Likely aspiration/sepsis. Could also be ICH given unequal pupils and recent thrombocytopenia. Intubated on arrival for airway protection. Sepsis protocol initiated including labs, LR bolus and broad spectrum Abx. BP has been  stable.   ED Course  I have reviewed the triage vital signs and the nursing notes.  Pertinent labs & imaging results that were available during my care of the patient were reviewed by me and considered in my medical decision making (see chart for details).  Clinical Course as of 01/12/2021 1058  Mon Jan 30, 2021  3094 CXR is relatively clear but ETT is deep and OG tube is coiled in chest. RN will reposition OG tube and let RT know ETT needs to be pulled back 2cm.  [CS]  0900 CT head shows large ICH with left shift. Will begin Cleviprex for BP control. Neurosurgery paged.  [CS]  0902 Spoke with patient's wife (son interpreting) to let them know he was very sick and they were advised to come to the ED right away. They are on the way now.  [CS]  0904 CBC with elevated WBC but no significant thrombocytopenia.  [CS]  [phone] Spoke with Dr. Maurice Small, Neurosurgery, who has reviewed CT images and will come evaluate the patient.  [CS]  [phone] ABC shows a mild respiratory acidosis. Good oxygenation.  [CS]  [id number] Dark red stomach contents in suction canister. Hemoccult is positive. Hgb is similar to previous. Gastoccult is pending [CS]  [id number] INR is mildly elevated at 1.3. Will begin FFP.  [CS]  [id number] Keppra ordered for seizure prophylaxis.  [CS]  [phone] Patient seen by Dr. Maurice Small who does not feel that the patient would benefit from surgery given the large size of his bleed and lack of corneal reflexes etc. He recommends PCCM evaluation and consideration of withdrawal of support after family discussion.  [CS]  72 Spoke with family at in Consult room with Spanish interpreter. Discuss the dire nature of his injury and likelihood that he will not survive. They are now at bedside with the patient.  [CS]  1021 Spoke with Canary Brim, NP with PCCM who will come evaluate the patient.  [CS]  1028 Lactic acid is mildly elevated.  [CS]    Clinical Course User Index [CS] Pollyann Savoy, MD  Final Clinical  Impression(s) / ED Diagnoses Final diagnoses:  Intracranial hemorrhage (HCC)  Acute respiratory failure with hypoxia (HCC)  Sepsis with encephalopathy without septic shock, due to unspecified organism [facility])  Alcohol abuse  Coagulopathy (HCC)    Rx / DC Orders ED Discharge Orders     None        Pollyann Savoy, MD 01/23/2021 1058

## 2021-01-30 NOTE — Progress Notes (Signed)
RT assisted with transportation of this pt from ED room 17 to CT and back while on full ventilatory support. Pt tolerated well with no complications and SVS. RN at bedside at this time.

## 2021-01-30 NOTE — Progress Notes (Signed)
Elink following code sepsis °

## 2021-01-30 NOTE — ED Notes (Signed)
Vitals validated and pt prepared for transport to 4N ICU.

## 2021-01-30 NOTE — Consult Note (Signed)
Neurosurgery Consultation  Reason for Consult: ICH Referring Physician: Bernette Mayers  CC: AMS  HPI: This is a 40 y.o. man that was found down this morning, LKW 02:00, seen at 08:00. He was agonal and hypoxic upon initial presentation, intubated in the ED, does have prior admissions for DTs with reported h/o cirrhosis. No further hx available due to pt's depressed mental status, no family at bedside.  ROS: A 14 point ROS was performed and is negative except as noted in the HPI but limited due to pt's depressed mental status.  PMHx:  Past Medical History:  Diagnosis Date   Alcohol abuse    Cirrhosis (HCC)    DTs (delirium tremens) (HCC)    Hypertension    FamHx: No family history on file. SocHx:  reports that he has been smoking. He has never used smokeless tobacco. He reports current alcohol use. He reports current drug use. Drug: Cocaine.  Exam: Vital signs in last 24 hours: Temp:  [98.6 F (37 C)-102.1 F (38.9 C)] 98.6 F (37 C) (10/31 1016) Pulse Rate:  [91-142] 131 (10/31 1030) Resp:  [16-33] 18 (10/31 1030) BP: (97-225)/(47-121) 97/47 (10/31 1030) SpO2:  [95 %-100 %] 97 % (10/31 1030) FiO2 (%):  [100 %] 100 % (10/31 0752) Weight:  [72.7 kg] 72.7 kg (10/31 0945) General: Lying in hospital bed, appears acutely ill Head: Normocephalic and atruamatic HEENT: Neck supple Pulmonary: intubated, good chest rise bilaterally Cardiac: RRR Abdomen: S NT ND Extremities: Warm and well perfused x4 Neuro: Intubated, sedation held, pupils fixed and dilated bilaterally, no corneals present, +cough w/ inline suction, extending x4 to painful stimulus / decerebrate  Assessment and Plan: 40 y.o. man w/ h/o EtOHism, found down with AMS, hypoxic at arrival. [facility] personally reviewed, which shows a very large L F ICH >100cc with severe brain compression, pan ventricular IVH.   -unfortunately, given the above factors, I do not think that surgical intervention will change the outcome and that this is  a non-survivable injury  -please call with any concerns or questions  Jadene Pierini, MD 17-Feb-2021 10:39 AM [facility]

## 2021-01-30 NOTE — Progress Notes (Signed)
Pharmacy Antibiotic Note  [name] is a 40 y.o. male admitted on 02/15/2021 with  E faecalis bacteremia .  Pharmacy has been consulted for Vancomycin dosing. Pt with PCN allergy - swelling.  Plan: Vancomycin 1500mg  IV now then 1250 mg IV Q 12 hrs. Goal AUC 400-550. Expected AUC: 490. SCr used: 0.8 Will f/u renal function, micro data, and pt's clinical condition Vanc levels prn F/u goals of care discussions in the morning   Height: 5\' 6"  (167.6 cm) Weight: 72.7 kg (160 lb 4.4 oz) IBW/kg (Calculated) : 63.8  Temp (24hrs), Avg:99.1 F (37.3 C), Min:97.9 F (36.6 C), Max:102.1 F (38.9 C)  Recent Labs  Lab 02/15/2021 0800 2021/02/15 0801 February 15, 2021 0813 2021/02/15 1045  WBC  --  27.2*  --   --   CREATININE  --  0.62 0.40*  --   LATICACIDVEN 3.5*  --   --  5.4*    Estimated Creatinine Clearance: 111.9 mL/min (A) (by C-G formula based on SCr of 0.4 mg/dL (L)).    Allergies  Allergen Reactions   Penicillins Swelling    Per his wife: eyes swelled shut.    Antimicrobials this admission: Vanc 10/31 >> Flagyl x 1 10/31 Cefepime x1 10/31   Microbiology results: 10/31 BCx: E faecalis 10/31 MRSA PCR: negative  Thank you for allowing pharmacy to be a part of this patient's care.  11/31, PharmD, BCPS Please see amion for complete clinical pharmacist phone list 2021-02-15 11:12 PM  PHARMACY - PHYSICIAN COMMUNICATION CRITICAL VALUE ALERT - BLOOD CULTURE IDENTIFICATION (BCID)  Name of physician (or Provider) Contacted: Dr. Christoper Fabian  Current antibiotics: None  Changes to prescribed antibiotics recommended:  Start vancomycin per pharmacy as above  Results for orders placed or performed during the hospital encounter of 2021/02/15  Blood Culture ID Panel (Reflexed) (Collected: 02/15/2021  8:00 AM)  Result Value Ref Range   Enterococcus faecalis DETECTED (A) NOT DETECTED   Enterococcus Faecium NOT DETECTED NOT DETECTED   Listeria monocytogenes NOT DETECTED NOT  DETECTED   Staphylococcus species NOT DETECTED NOT DETECTED   Staphylococcus aureus (BCID) NOT DETECTED NOT DETECTED   Staphylococcus epidermidis NOT DETECTED NOT DETECTED   Staphylococcus lugdunensis NOT DETECTED NOT DETECTED   Streptococcus species NOT DETECTED NOT DETECTED   Streptococcus agalactiae NOT DETECTED NOT DETECTED   Streptococcus pneumoniae NOT DETECTED NOT DETECTED   Streptococcus pyogenes NOT DETECTED NOT DETECTED   A.calcoaceticus-baumannii NOT DETECTED NOT DETECTED   Bacteroides fragilis NOT DETECTED NOT DETECTED   Enterobacterales NOT DETECTED NOT DETECTED   Enterobacter cloacae complex NOT DETECTED NOT DETECTED   Escherichia coli NOT DETECTED NOT DETECTED   Klebsiella aerogenes NOT DETECTED NOT DETECTED   Klebsiella oxytoca NOT DETECTED NOT DETECTED   Klebsiella pneumoniae NOT DETECTED NOT DETECTED   Proteus species NOT DETECTED NOT DETECTED   Salmonella species NOT DETECTED NOT DETECTED   Serratia marcescens NOT DETECTED NOT DETECTED   Haemophilus influenzae NOT DETECTED NOT DETECTED   Neisseria meningitidis NOT DETECTED NOT DETECTED   Pseudomonas aeruginosa NOT DETECTED NOT DETECTED   Stenotrophomonas maltophilia NOT DETECTED NOT DETECTED   Candida albicans NOT DETECTED NOT DETECTED   Candida auris NOT DETECTED NOT DETECTED   Candida glabrata NOT DETECTED NOT DETECTED   Candida krusei NOT DETECTED NOT DETECTED   Candida parapsilosis NOT DETECTED NOT DETECTED   Candida tropicalis NOT DETECTED NOT DETECTED   Cryptococcus neoformans/gattii NOT DETECTED NOT DETECTED   Vancomycin resistance NOT DETECTED NOT DETECTED    02/01/21,  PharmD, BCPS Please see amion for complete clinical pharmacist phone list 01/12/2021 11:17 PM

## 2021-01-30 NOTE — ED Triage Notes (Signed)
Pt with hx of ETOH abuse here via EMS. LSN today 0200 and was found unresponsive this morning. Pt with agonal respirations, arrives with assisted ventilations via BVM. GCS 3, unequal pupils, no signs of trauma.

## 2021-01-30 NOTE — Progress Notes (Signed)
Notified provider of need to order repeat lactic acid (#3) @ 1245.

## 2021-01-30 NOTE — Progress Notes (Signed)
Code sepsis discontinued. 

## 2021-01-30 NOTE — ED Notes (Signed)
Pt care assumed by this RN. Noted fixed and dilated pupils at 6 bilaterally. No response to noxious stimuli.

## 2021-01-30 NOTE — Progress Notes (Signed)
1530: informed CCM that patient BP was dropping. I asked if any interventions were to be implemented at this time. I was informed that the family was award of the patients poor prognosis and no escalation was warranted at this time.  1600: Informed that daughter was going to inform the patients mother of the patients poor prognosis. Patient has not been made comfort care at this time. Palliative care was contacted with no word at this time.    1644: CCM was informed that patients pressure was continuing to drop and no word from the family at this time.   1830: Multiple attempts have been made to contact family and inform them of the patients decline.   1920: CCM was contacted and I Emogene Morgan RN requested intervention to sustain BP until family can be contacted.   Patient will continue to be monitored.

## 2021-01-30 NOTE — Progress Notes (Signed)
Per ED MD order, RT pulled back 2cm on pt's ETT. Pt is now 24 at the lip.

## 2021-01-31 DIAGNOSIS — J9601 Acute respiratory failure with hypoxia: Secondary | ICD-10-CM

## 2021-01-31 LAB — POCT I-STAT 7, (LYTES, BLD GAS, ICA,H+H)
Acid-base deficit: 2 mmol/L (ref 0.0–2.0)
Acid-base deficit: 2 mmol/L (ref 0.0–2.0)
Bicarbonate: 22 mmol/L (ref 20.0–28.0)
Bicarbonate: 25.7 mmol/L (ref 20.0–28.0)
Calcium, Ion: 1.33 mmol/L (ref 1.15–1.40)
Calcium, Ion: 1.4 mmol/L (ref 1.15–1.40)
HCT: 30 % — ABNORMAL LOW (ref 39.0–52.0)
HCT: 30 % — ABNORMAL LOW (ref 39.0–52.0)
Hemoglobin: 10.2 g/dL — ABNORMAL LOW (ref 13.0–17.0)
Hemoglobin: 10.2 g/dL — ABNORMAL LOW (ref 13.0–17.0)
O2 Saturation: 97 %
O2 Saturation: 99 %
Patient temperature: 97.1
Patient temperature: 97.1
Potassium: 3.3 mmol/L — ABNORMAL LOW (ref 3.5–5.1)
Potassium: 3.4 mmol/L — ABNORMAL LOW (ref 3.5–5.1)
Sodium: 143 mmol/L (ref 135–145)
Sodium: 144 mmol/L (ref 135–145)
TCO2: 23 mmol/L (ref 22–32)
TCO2: 28 mmol/L (ref 22–32)
pCO2 arterial: 31.9 mmHg — ABNORMAL LOW (ref 32.0–48.0)
pCO2 arterial: 58.8 mmHg — ABNORMAL HIGH (ref 32.0–48.0)
pH, Arterial: 7.244 — ABNORMAL LOW (ref 7.350–7.450)
pH, Arterial: 7.444 (ref 7.350–7.450)
pO2, Arterial: 100 mmHg (ref 83.0–108.0)
pO2, Arterial: 106 mmHg (ref 83.0–108.0)

## 2021-01-31 LAB — PREPARE FRESH FROZEN PLASMA

## 2021-01-31 LAB — BPAM FFP
Blood Product Expiration Date: 202210312359
ISSUE DATE / TIME: 202210311011
Unit Type and Rh: 6200

## 2021-01-31 LAB — TRIGLYCERIDES: Triglycerides: 75 mg/dL (ref <150)

## 2021-01-31 DEATH — deceased

## 2021-02-01 LAB — CULTURE, BLOOD (ROUTINE X 2)
Special Requests: ADEQUATE
Special Requests: ADEQUATE

## 2021-03-02 NOTE — Procedures (Signed)
Adult Brain Death Determination  Time of Examination: 2021-02-08 9:35 AM  No Evidence of /Cause of Reversible CNS Depression  Core temperature must be greater >36 degrees. Last temp: Temp: (!) 97.1 F (36.2 C)  (Note: If unable to achieve normothermia after 12 hours of temperature management, may consider proceeding with Brain Death Evaluation.):    yes  Evidence of severe metabolic perturbations that could potentate CNS depression. Consider glucose, Na, creatinine, PaCO2, SaO2.:    Absent  Evidence of drugs, by history or measurement, that could potentiate central nervous system depression: narcotics, ethanol, benzodiazepines, barbiturates, neuromuscular blockade.:     Absent  Absence of Cortical Function  GCS = 3:    yes  Absence of Brain Stem Reflexes and Responses  Pupils light-fixed    yes  Corneal reflexes:    Absent  Response to upper and lower airway stimulation, such as pharyngeal and endotracheal suctioning.:    Absent  Ocular response to head turning (eye movement).    Absent  Absence of Spontaneous Respirations  (Apnea test performed per Brain Death Policy. If not met due to hemodynamic/ventilatory instability, then perform EEG, TCD, or cerebral blow flow studies.)  1.   Spontaneous Respirations   Absent  2.   PaCO2 at start of apnea test:  31  3.   PaCO2 at end of apnea test:  60  4.   CO2 rise of 20 or greater from baseline:   yes  Document Confirmatory Test Utilized: (Optional) Nuclear cerebral flow, cerebral angiography (CT/MR angio), transcranial Doppler ultrasound, EEG, SSEP (record results).  Test results (if available):  CT head showing massive intracranial hemorrhage  Patient pronounced dead by neurological criteria at 9:35 AM on Feb 08, 2021.  Candee Furbish, MD 2021-02-08 9:35 AM

## 2021-03-02 NOTE — Progress Notes (Signed)
NAME:  [name], MRN:  [id number], DOB:  [birth year], LOS: 1 ADMISSION DATE:  01/26/2021, CONSULTATION DATE:  10/31 REFERRING MD:  Dr. Bernette Mayers, CHIEF COMPLAINT:  AMS, ICH   History of Present Illness:  39 y/o M who presented to [facility] ER via EMS 10/31 with reports of AMS, emesis and agonal respirations.  Intubated per EMS.  Patient recently seen in ER at [facility] on 10/24 for an aching right sided chest pain. He had a normal EKG at that time and right sided chest pain.  Troponin negative x2. Pain was thought to be muscular in nature.    Wife reports he has been complaining of chest pain for several weeks.  No headaches, dizziness.  Wife found him am of presentation on couch, he had vomited and she was unable to wake him.  EMS was called.    In ER found to have large ICH with midline shift.  Reviewed by neurosurgery and not a candidate for interventions.  Interview conducted with assistance of Spanish interpretor on a stick.  Pertinent  Medical History  ETOH Abuse with prior DT's HTN Cirrhosis  Thrombocytopenia   Significant Hospital Events: Including procedures, antibiotic start and stop dates in addition to other pertinent events   10/31 Admit with very large ICH.  DNR established.   Interim History / Subjective:  Tmax 102 / WBC 27.2  Vent - 40% / PEEP 5 Glucose 212 I/O 2.4L UOP, +890 in last 24 hours RN reports pt's family planning to visit today.    Objective   Blood pressure (!) 89/53, pulse 63, temperature (!) 97.1 F (36.2 C), temperature source Axillary, resp. rate 18, height 5\' 6"  (1.676 m), weight 72.7 kg, SpO2 99 %.    Vent Mode: PRVC FiO2 (%):  [40 %] 40 % Set Rate:  [18 bmp] 18 bmp Vt Set:  [510 mL] 510 mL PEEP:  [5 cmH20] 5 cmH20 Plateau Pressure:  [15 cmH20] 15 cmH20   Intake/Output Summary (Last 24 hours) at Feb 13, 2021 13/04/2020 Last data filed at February 13, 2021 0700 Gross per 24 hour  Intake 2965.11 ml  Output 2900 ml  Net 65.11 ml   Filed Weights    01/08/2021 0945  Weight: 72.7 kg    Examination: General: critically ill appearing adult male on vent in NAD HEENT: MM pink/moist, ETT, pupils 4-40mm non-reactive Neuro: eyes closed, no response to pain, no cough, no gag, no corneal response, no spontaneous respirations on PSV CV: s1s2 RRR, no m/r/g, on neosynephrine  PULM: non-labored on full support, coarse breath sounds GI: soft, bsx4 active  Extremities: warm/dry, no edema  Skin: no rashes or lesions  Resolved Hospital Problem list      Assessment & Plan:   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 -patient is not a candidate for surgical intervention -supportive care -DNR in the event of arrest -apnea testing this am given clinical exam   Acute Respiratory Failure in setting of ICH  -PRVC 8cc/kg  -no wean given devastating ICH -pending further family discussion regarding goals of care -follow intermittent CXR   Hypotension in setting of ICH  -neosynephrine gtt (started overnight 10/31) to allow family time to arrive for further discussion   Anemia of chronic disease with hx of ETOH with cirrhosis. -defer additional lab testing   Fever. -defer abx  Lactic acidosis. -in setting of cirrhosis, hypoxia  -no further testing  Hypokalemia. -no further labs  Goals of care. -DNR  -palliative care consulted  -await further family  arrival for goals of care, discussion regarding results of apnea testing   Critical care time: 33 minutes     Canary Brim, MSN, APRN, NP-C, AGACNP-BC [facility] 02-28-2021, 9:26 AM   Please see [email or web address] for pager details.   From 7A-7P if no response, please call [phone] After hours, please call ELink [phone]

## 2021-03-02 NOTE — Progress Notes (Signed)
ABG before Apnea test: 7.44/ co2 31 ABG after Apnea test: 7.24/ co2 59  ABG performed before test. Pt cuff deflated and vent placed on standby. Oxygen tubing placed into tube at 8L. Vitals monitored during 8 minutes and no chest rise. ABG performed at 8 minutes.  Pt placed back on the ventilator with cuff inflated.

## 2021-03-02 NOTE — Progress Notes (Incomplete)
2000 Patient due to void, had not voided since admission. Bladder scan showed >999, bladder firm to palpation.  2100 Patient extremely difficult to catheterize. Multiple attempts made by various nurses with various types and sizes of foley catheters. Sterile technique and best practices observed with each attempt. Notified Elink MD.  2200 8 French pediatric foley catheter allowed access, however length too short to inflate balloon, therefore was utilized as an intermittent catheterization.

## 2021-03-02 NOTE — Progress Notes (Signed)
Patient declared brain dead at 0935 Patient was extubated at 1100. Cardiac time of death was noted at 1122. Family is at the bedside. Will continue to support family.

## 2021-03-02 NOTE — Progress Notes (Signed)
Family at bedside Informed them of patient's passing, appreciate interpreter help Patient is not a registered organ donor Extubation order placed   Myrla Halsted MD PCCM

## 2021-03-02 NOTE — Progress Notes (Signed)
[place] was notified of Cardiac time of death. Post mortem has been completed. Family will call with funeral home choice. ME was called and no exam is warranted. Patient placement has been notified.

## 2021-03-02 NOTE — Death Summary Note (Signed)
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  Intubated per EMS.   Patient recently seen in ER at [facility] on 10/24 for an aching right sided chest pain. He had a normal EKG at that time and right sided chest pain.  Troponin negative x2. Pain was thought to be muscular in nature.    Wife reports he has been complaining of chest pain for several weeks.  No headaches, dizziness.  Wife found him am of presentation on couch, he had vomited and she was unable to wake him.  EMS was called.     In ER found to have large ICH with midline shift.  Reviewed by neurosurgery and not a candidate for interventions.  Unfortunately despite aggressive care and vent support patient progressed to brain herniation and declared deceased at 9:35AM.  Pertinent Labs and Studies  Significant Diagnostic Studies DG Chest 2 View  Result Date: 01/23/2021 CLINICAL DATA:  40 year old male with new onset right chest pain this morning. EXAM: CHEST - 2 VIEW COMPARISON:  Portable chest 01/03/2021 and earlier. FINDINGS: PA and lateral views. Improved, normal lung volumes. Normal cardiac size and mediastinal contours. Visualized tracheal air column is within normal limits. Both lungs appear clear. No pneumothorax or pleural effusion. Negative visible bowel gas and osseous structures. IMPRESSION: Negative.  No  cardiopulmonary abnormality. Electronically Signed   By: Odessa Fleming M.D.   On: 01/23/2021 08:56   CT Head Wo Contrast  Result Date: Feb 14, 2021 CLINICAL DATA:  Mental status change of unknown cause. Found unresponsive with agonal respirations. EXAM: CT HEAD WITHOUT CONTRAST TECHNIQUE: Contiguous axial images were obtained from the base of the skull through the vertex without intravenous contrast. COMPARISON:  12/29/2020 FINDINGS: Brain: 7.1 x 4.5 x 6.4 cm (volume = 110 cm^3) intraparenchymal hemorrhage within the left frontal lobe. Intraventricular penetration into the frontal horn of the left lateral ventricle with intraventricular blood extending down through the fourth ventricle. Mass effect with left-to-right shift of 1.5 cm. 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 No subdural collection. Vascular: No abnormal vascular finding. Skull: Negative Sinuses/Orbits: Clear/normal Other: None IMPRESSION: 110 cc intraparenchymal hemorrhage within the left frontal lobe. Intraventricular penetration. Mass effect with left-to-right shift of 15 mm. 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 Critical Value/emergent results were called by telephone at the time of interpretation on February 14, 2021 at 8:52 am to provider [facility] , who verbally acknowledged these results. Electronically Signed   By: Paulina Fusi M.D.   On: 2021/02/14 08:54   DG Chest Portable 1 View  Result Date: 02/14/2021 CLINICAL DATA:  intubation EXAM: PORTABLE CHEST 1 VIEW COMPARISON:  January 23, 2021. FINDINGS: Endotracheal tube tip approximately 1.3 cm above the carina. Gastric tube is looped back on itself in the lower chest, coursing superiorly outside the field of view in the neck. Patchy airspace opacities in the right upper lung. No visible pneumothorax or pleural effusion  on this supine radiograph. IMPRESSION: 1. Enteric tube is a approximately 1.3 cm above the carina. Recommend  retraction. 2. Gastric tube is looped back on itself in the lower chest, coursing superiorly outside the field of view in the neck. Recommend replacement. 3. Patchy opacities in the right upper lung, suspicious for aspiration and/or pneumonia. Findings and recommendations discussed with Dr. Bernette Mayers via telephone at 8:18 AM. Electronically Signed   By: Feliberto Harts M.D.   On: 02/08/21 08:20   DG Chest Port 1 View  Result Date: 01/03/2021 CLINICAL DATA:  Sudden onset fever and shortness of breath EXAM: PORTABLE CHEST 1 VIEW COMPARISON:  01/01/2021 FINDINGS: Cardiac shadow is enlarged but stable. The lungs are well aerated bilaterally. Previously seen feeding catheter has been removed in the interval. No bony abnormality is seen. IMPRESSION: No active disease. Electronically Signed   By: Alcide Clever M.D.   On: 01/03/2021 19:32    Microbiology Recent Results (from the past 240 hour(s))  Resp Panel by RT-PCR (Flu A&B, Covid) Nasopharyngeal Swab     Status: None   Collection Time: 2021/02/08  8:00 AM   Specimen: Nasopharyngeal Swab; Nasopharyngeal(NP) swabs in vial transport medium  Result Value Ref Range Status   SARS Coronavirus 2 by RT PCR NEGATIVE NEGATIVE Final    Comment: (NOTE) SARS-CoV-2 target nucleic acids are NOT DETECTED.  The SARS-CoV-2 RNA is generally detectable in upper respiratory specimens during the acute phase of infection. The lowest concentration of SARS-CoV-2 viral copies this assay can detect is 138 copies/mL. A negative result does not preclude SARS-Cov-2 infection and should not be used as the sole basis for treatment or other patient management decisions. A negative result may occur with  improper specimen collection/handling, submission of specimen other than nasopharyngeal swab, presence of viral mutation(s) within the areas targeted by this assay, and inadequate number of viral copies(<138 copies/mL). A negative result must be combined with clinical  observations, patient history, and epidemiological information. The expected result is Negative.  Fact Sheet for Patients:  BloggerCourse.com  Fact Sheet for Healthcare Providers:  SeriousBroker.it  This test is no t yet approved or cleared by the [place] FDA and  has been authorized for detection and/or diagnosis of SARS-CoV-2 by FDA under an Emergency Use Authorization (EUA). This EUA will remain  in effect (meaning this test can be used) for the duration of the COVID-19 declaration under Section 564(b)(1) of the Act, 21 U.S.C.section 360bbb-3(b)(1), unless the authorization is terminated  or revoked sooner.       Influenza A by PCR NEGATIVE NEGATIVE Final   Influenza B by PCR NEGATIVE NEGATIVE Final    Comment: (NOTE) The Xpert Xpress SARS-CoV-2/FLU/RSV plus assay is intended as an aid in the diagnosis of influenza from Nasopharyngeal swab specimens and should not be used as a sole basis for treatment. Nasal washings and aspirates are unacceptable for Xpert Xpress SARS-CoV-2/FLU/RSV testing.  Fact Sheet for Patients: BloggerCourse.com  Fact Sheet for Healthcare Providers: SeriousBroker.it  This test is not yet approved or cleared by the [place] FDA and has been authorized for detection and/or diagnosis of SARS-CoV-2 by FDA under an Emergency Use Authorization (EUA). This EUA will remain in effect (meaning this test can be used) for the duration of the COVID-19 declaration under Section 564(b)(1) of the Act, 21 U.S.C. section 360bbb-3(b)(1), unless the authorization is terminated or revoked.  Performed at [facility], [address] [place], [place] [address]   Blood Culture (routine x 2)  Status: Abnormal (Preliminary result)   Collection Time: 01/10/2021  8:00 AM   Specimen: BLOOD LEFT WRIST  Result Value Ref Range Status   Specimen Description  BLOOD LEFT WRIST  Final   Special Requests   Final    BOTTLES DRAWN AEROBIC AND ANAEROBIC Blood Culture adequate volume   Culture  Setup Time   Final    GRAM POSITIVE COCCI IN CHAINS IN BOTH AEROBIC AND ANAEROBIC BOTTLES CRITICAL RESULT CALLED TO, READ BACK BY AND VERIFIED WITH: PHARMD CAREN AMEND 01/09/2021 @2244  BY JW    Culture (A)  Final    ENTEROCOCCUS FAECALIS SUSCEPTIBILITIES TO FOLLOW Performed at [facility], [address], [place], [place]    Report Status PENDING  Incomplete  Blood Culture ID Panel (Reflexed)     Status: Abnormal   Collection Time: 01/03/2021  8:00 AM  Result Value Ref Range Status   Enterococcus faecalis DETECTED (A) NOT DETECTED Final    Comment: CRITICAL RESULT CALLED TO, READ BACK BY AND VERIFIED WITH: PHARMD CAREN AMEND 01/04/2021 @2244  BY JW    Enterococcus Faecium NOT DETECTED NOT DETECTED Final   Listeria monocytogenes NOT DETECTED NOT DETECTED Final   Staphylococcus species NOT DETECTED NOT DETECTED Final   Staphylococcus aureus (BCID) NOT DETECTED NOT DETECTED Final   Staphylococcus epidermidis NOT DETECTED NOT DETECTED Final   Staphylococcus lugdunensis NOT DETECTED NOT DETECTED Final   Streptococcus species NOT DETECTED NOT DETECTED Final   Streptococcus agalactiae NOT DETECTED NOT DETECTED Final   Streptococcus pneumoniae NOT DETECTED NOT DETECTED Final   Streptococcus pyogenes NOT DETECTED NOT DETECTED Final   A.calcoaceticus-baumannii NOT DETECTED NOT DETECTED Final   Bacteroides fragilis NOT DETECTED NOT DETECTED Final   Enterobacterales NOT DETECTED NOT DETECTED Final   Enterobacter cloacae complex NOT DETECTED NOT DETECTED Final   Escherichia coli NOT DETECTED NOT DETECTED Final   Klebsiella aerogenes NOT DETECTED NOT DETECTED Final   Klebsiella oxytoca NOT DETECTED NOT DETECTED Final   Klebsiella pneumoniae NOT DETECTED NOT DETECTED Final   Proteus species NOT DETECTED NOT DETECTED Final   Salmonella species NOT  DETECTED NOT DETECTED Final   Serratia marcescens NOT DETECTED NOT DETECTED Final   Haemophilus influenzae NOT DETECTED NOT DETECTED Final   Neisseria meningitidis NOT DETECTED NOT DETECTED Final   Pseudomonas aeruginosa NOT DETECTED NOT DETECTED Final   Stenotrophomonas maltophilia NOT DETECTED NOT DETECTED Final   Candida albicans NOT DETECTED NOT DETECTED Final   Candida auris NOT DETECTED NOT DETECTED Final   Candida glabrata NOT DETECTED NOT DETECTED Final   Candida krusei NOT DETECTED NOT DETECTED Final   Candida parapsilosis NOT DETECTED NOT DETECTED Final   Candida tropicalis NOT DETECTED NOT DETECTED Final   Cryptococcus neoformans/gattii NOT DETECTED NOT DETECTED Final   Vancomycin resistance NOT DETECTED NOT DETECTED Final    Comment: Performed at [facility], [address], [place], [address] [place]  Blood Culture (routine x 2)     Status: Abnormal (Preliminary result)   Collection Time: 01/07/2021  8:05 AM   Specimen: BLOOD RIGHT HAND  Result Value Ref Range Status   Specimen Description BLOOD RIGHT HAND  Final   Special Requests   Final    BOTTLES DRAWN AEROBIC AND ANAEROBIC Blood Culture adequate volume   Culture  Setup Time   Final    GRAM POSITIVE COCCI IN CHAINS IN BOTH AEROBIC AND ANAEROBIC BOTTLES CRITICAL VALUE NOTED.  VALUE IS CONSISTENT WITH PREVIOUSLY REPORTED AND CALLED VALUE. Performed  at [facility], [address] [place], [place] [address]    Culture ENTEROCOCCUS FAECALIS (A)  Final   Report Status PENDING  Incomplete  MRSA Next Gen by PCR, Nasal     Status: None   Collection Time: 02/17/2021 12:18 PM   Specimen: Nasal Mucosa; Nasal Swab  Result Value Ref Range Status   MRSA by PCR Next Gen NOT DETECTED NOT DETECTED Final    Comment: (NOTE) The GeneXpert MRSA Assay (FDA approved for NASAL specimens only), is one component of a comprehensive MRSA colonization surveillance program. It is not intended to diagnose MRSA infection nor to  guide or monitor treatment for MRSA infections. Test performance is not FDA approved in patients less than 71 years old. Performed at [facility], [address], [place], [place] [address]     Lab Basic Metabolic Panel: Recent Labs  Lab 2021-02-17 0801 17-Feb-2021 0813 02/17/21 0912 02/01/2021 0905 02/13/2021 0923  NA 132* 135 135 143 144  K 3.0* 3.0* 3.0* 3.4* 3.3*  CL 100 101  --   --   --   CO2 19*  --   --   --   --   GLUCOSE 213* 212*  --   --   --   BUN 8 7  --   --   --   CREATININE 0.62 0.40*  --   --   --   CALCIUM 8.8*  --   --   --   --    Liver Function Tests: Recent Labs  Lab 2021-02-17 0801  AST 46*  ALT 28  ALKPHOS 173*  BILITOT 1.4*  PROT 8.0  ALBUMIN 2.9*   Recent Labs  Lab 2021/02/17 0801  LIPASE 45   Recent Labs  Lab Feb 17, 2021 0805  AMMONIA 91*   CBC: Recent Labs  Lab Feb 17, 2021 0801 2021/02/17 0813 02-17-21 0912 02/17/2021 0905 02/04/2021 0923  WBC 27.2*  --   --   --   --   NEUTROABS 22.8*  --   --   --   --   HGB 11.3* 12.2* 10.5* 10.2* 10.2*  HCT 34.5* 36.0* 31.0* 30.0* 30.0*  MCV 87.3  --   --   --   --   PLT 188  --   --   --   --    Cardiac Enzymes: No results for input(s): CKTOTAL, CKMB, CKMBINDEX, TROPONINI in the last 168 hours. Sepsis Labs: Recent Labs  Lab 02/17/21 0800 02/17/21 0801 February 17, 2021 1045  WBC  --  27.2*  --   LATICACIDVEN 3.5*  --  5.4*   Lorin Glass 02/11/2021, 5:32 PM

## 2021-03-02 NOTE — Progress Notes (Signed)
Pt one way extubated with RN at bedside.

## 2021-03-02 NOTE — TOC CAGE-AID Note (Signed)
Transition of Care [facility]) - CAGE-AID Screening   Patient Details  Name: Quinto Tippy MRN: [id number] Date of Birth: 02/12/1981  Transition of Care [facility]) CM/SW Contact:    Sholanda Croson C Tarpley-Carter, LCSWA Phone Number: 2021-02-23, 12:15 PM   Clinical Narrative: Pt is unable to participate in Cage Aid.  Marybel Alcott Tarpley-Carter, MSW, LCSW-A Pronouns:  She/Her/Hers [facility]itions of Care Clinical Social Worker Direct Number:  [phone] [email or web address]  CAGE-AID Screening: Substance Abuse Screening unable to be completed due to: : Patient unable to participate             Substance Abuse Education Offered: No

## 2021-03-02 DEATH — deceased

## 2021-12-02 IMAGING — DX DG CHEST 1V PORT
1 series · 1 of 1 positions shown · non-contrast
Comparison: January 23, 2021.

CLINICAL DATA: intubation

EXAM:
PORTABLE CHEST 1 VIEW

[chest ap]
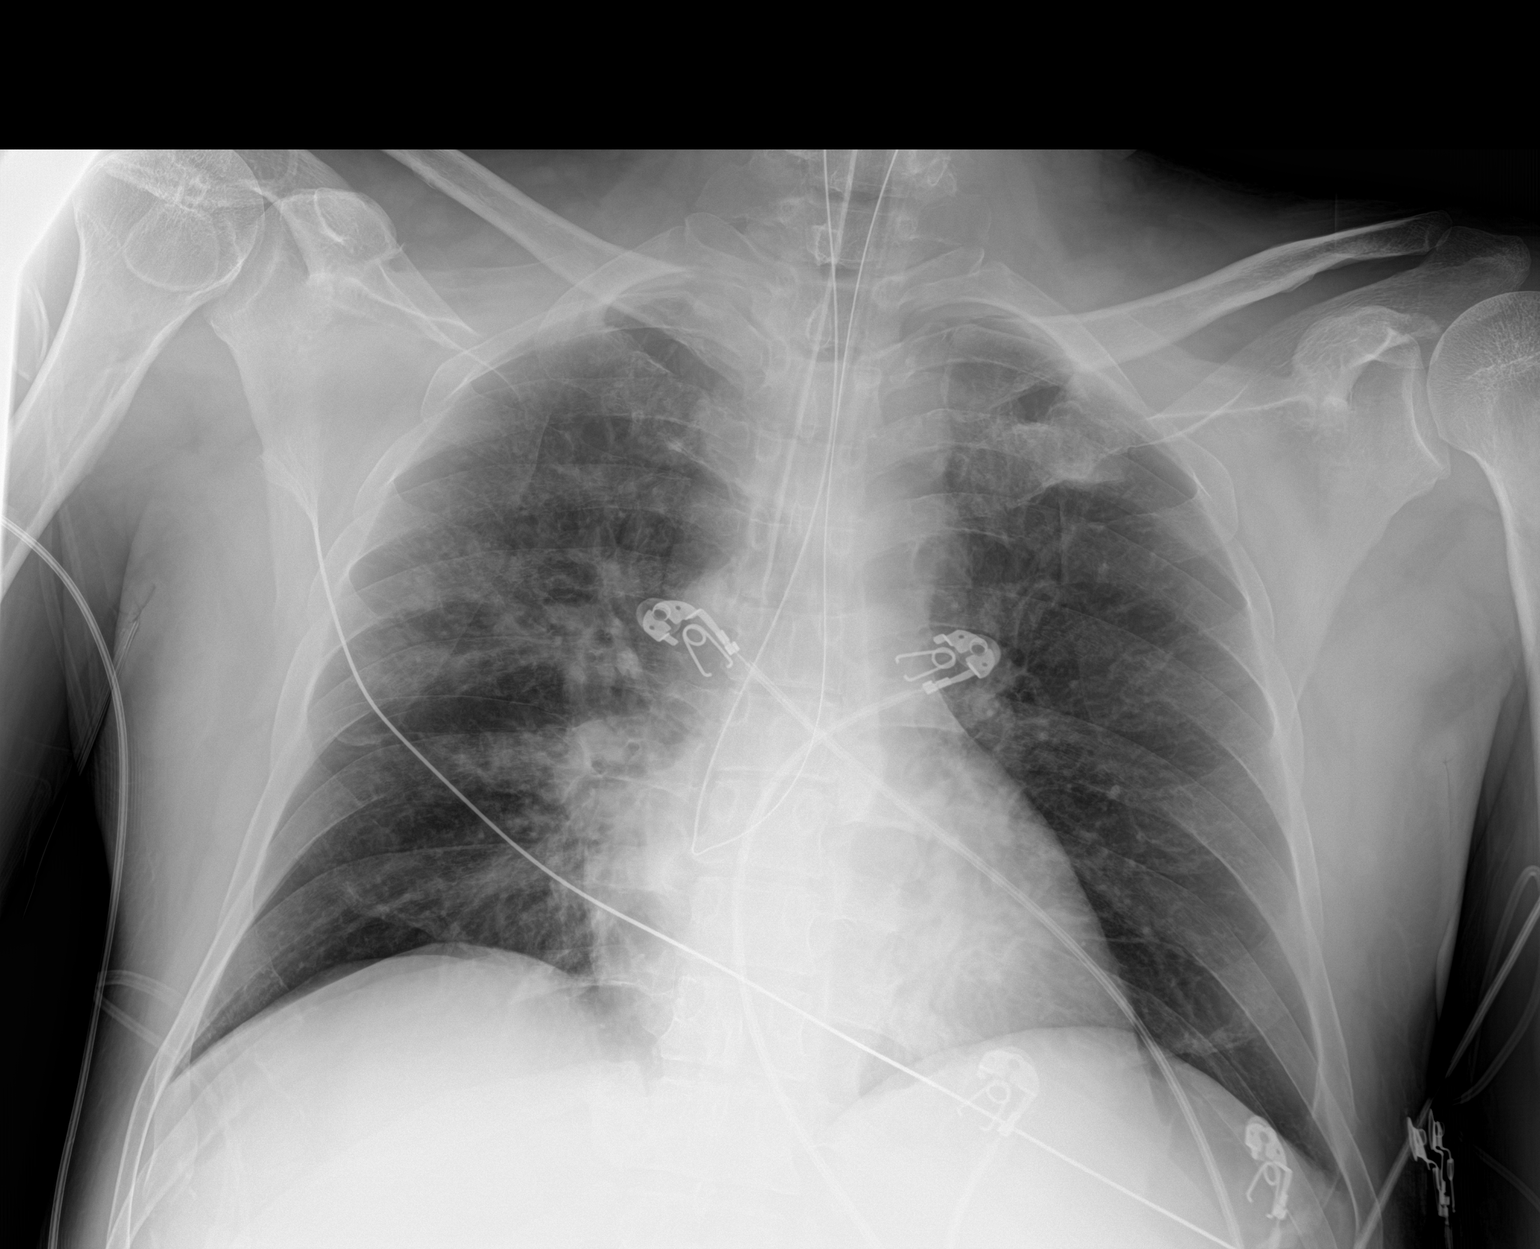

[1 of 1 positions shown; findings below may reference images not displayed]

FINDINGS: Endotracheal tube tip approximately 1.3 cm above the carina. Gastric
tube is looped back on itself in the lower chest, coursing
superiorly outside the field of view in the neck. Patchy airspace
opacities in the right upper lung. No visible pneumothorax or
pleural effusion on this supine radiograph.
IMPRESSION: 1. Enteric tube is a approximately 1.3 cm above the carina.
Recommend retraction.
2. Gastric tube is looped back on itself in the lower chest,
coursing superiorly outside the field of view in the neck. Recommend
replacement.
3. Patchy opacities in the right upper lung, suspicious for
aspiration and/or pneumonia.

Findings and recommendations discussed with Dr. Behrs via
telephone at [DATE].
